# Patient Record
Sex: Female | Born: 1945
Health system: Southern US, Community
[De-identification: ages and names within clinical notes are randomized; demographics above are authoritative.]

## PROBLEM LIST (undated history)

## (undated) DIAGNOSIS — E782 Mixed hyperlipidemia: Secondary | ICD-10-CM

## (undated) DIAGNOSIS — G4710 Hypersomnia, unspecified: Secondary | ICD-10-CM

## (undated) DIAGNOSIS — H905 Unspecified sensorineural hearing loss: Secondary | ICD-10-CM

## (undated) DIAGNOSIS — F329 Major depressive disorder, single episode, unspecified: Secondary | ICD-10-CM

## (undated) DIAGNOSIS — E1165 Type 2 diabetes mellitus with hyperglycemia: Secondary | ICD-10-CM

## (undated) DIAGNOSIS — Z1211 Encounter for screening for malignant neoplasm of colon: Secondary | ICD-10-CM

## (undated) DIAGNOSIS — D563 Thalassemia minor: Secondary | ICD-10-CM

## (undated) DIAGNOSIS — E039 Hypothyroidism, unspecified: Secondary | ICD-10-CM

## (undated) DIAGNOSIS — F32A Depression, unspecified: Secondary | ICD-10-CM

## (undated) DIAGNOSIS — J189 Pneumonia, unspecified organism: Secondary | ICD-10-CM

## (undated) DIAGNOSIS — IMO0001 Reserved for inherently not codable concepts without codable children: Secondary | ICD-10-CM

## (undated) DIAGNOSIS — T783XXA Angioneurotic edema, initial encounter: Secondary | ICD-10-CM

## (undated) DIAGNOSIS — E559 Vitamin D deficiency, unspecified: Secondary | ICD-10-CM

## (undated) DIAGNOSIS — E875 Hyperkalemia: Secondary | ICD-10-CM

## (undated) DIAGNOSIS — M8580 Other specified disorders of bone density and structure, unspecified site: Secondary | ICD-10-CM

## (undated) DIAGNOSIS — Z23 Encounter for immunization: Secondary | ICD-10-CM

## (undated) DIAGNOSIS — I10 Essential (primary) hypertension: Secondary | ICD-10-CM

## (undated) HISTORY — PX: TONSILLECTOMY AND ADENOIDECTOMY: SHX28

## (undated) HISTORY — DX: Other specified disorders of bone density and structure, unspecified site: M85.80

## (undated) HISTORY — DX: Encounter for screening for malignant neoplasm of colon: Z12.11

## (undated) HISTORY — DX: Hypersomnia, unspecified: G47.10

## (undated) HISTORY — DX: Reserved for inherently not codable concepts without codable children: IMO0001

## (undated) HISTORY — DX: Hyperkalemia: E87.5

## (undated) HISTORY — DX: Major depressive disorder, single episode, unspecified: F32.9

## (undated) HISTORY — DX: Depression, unspecified: F32.A

## (undated) HISTORY — DX: Mixed hyperlipidemia: E78.2

## (undated) HISTORY — DX: Pneumonia, unspecified organism: J18.9

## (undated) HISTORY — DX: Hypothyroidism, unspecified: E03.9

## (undated) HISTORY — DX: Unspecified sensorineural hearing loss: H90.5

## (undated) HISTORY — DX: Angioneurotic edema, initial encounter: T78.3XXA

## (undated) HISTORY — DX: Thalassemia minor: D56.3

## (undated) HISTORY — DX: Encounter for immunization: Z23

## (undated) HISTORY — PX: BREAST BIOPSY: SHX20

## (undated) HISTORY — DX: Type 2 diabetes mellitus with hyperglycemia: E11.65

## (undated) HISTORY — DX: Essential (primary) hypertension: I10

## (undated) HISTORY — DX: Vitamin D deficiency, unspecified: E55.9

---

## 2006-03-28 HISTORY — PX: COLONOSCOPY: SHX174

## 2008-11-02 ENCOUNTER — Emergency Department (HOSPITAL_COMMUNITY): Admission: EM | Admit: 2008-11-02 | Discharge: 2008-11-02 | Payer: Self-pay | Admitting: Emergency Medicine

## 2008-11-11 DIAGNOSIS — I340 Nonrheumatic mitral (valve) insufficiency: Secondary | ICD-10-CM | POA: Insufficient documentation

## 2008-11-11 DIAGNOSIS — I079 Rheumatic tricuspid valve disease, unspecified: Secondary | ICD-10-CM

## 2009-04-13 ENCOUNTER — Encounter: Admission: RE | Admit: 2009-04-13 | Discharge: 2009-04-13 | Payer: Self-pay | Admitting: Family Medicine

## 2009-04-30 ENCOUNTER — Encounter: Admission: RE | Admit: 2009-04-30 | Discharge: 2009-04-30 | Payer: Medicare Other | Admitting: Family Medicine

## 2009-10-28 ENCOUNTER — Encounter: Admission: RE | Admit: 2009-10-28 | Discharge: 2009-10-28 | Payer: Self-pay | Admitting: Family Medicine

## 2010-02-25 DIAGNOSIS — J189 Pneumonia, unspecified organism: Secondary | ICD-10-CM

## 2010-02-25 HISTORY — DX: Pneumonia, unspecified organism: J18.9

## 2010-03-15 DIAGNOSIS — L659 Nonscarring hair loss, unspecified: Secondary | ICD-10-CM | POA: Insufficient documentation

## 2010-03-15 DIAGNOSIS — J3089 Other allergic rhinitis: Secondary | ICD-10-CM | POA: Insufficient documentation

## 2010-03-22 ENCOUNTER — Emergency Department (HOSPITAL_BASED_OUTPATIENT_CLINIC_OR_DEPARTMENT_OTHER)
Admission: EM | Admit: 2010-03-22 | Discharge: 2010-03-22 | Payer: Self-pay | Source: Home / Self Care | Admitting: Emergency Medicine

## 2010-04-17 ENCOUNTER — Other Ambulatory Visit: Payer: Self-pay | Admitting: Family Medicine

## 2010-04-17 DIAGNOSIS — N63 Unspecified lump in unspecified breast: Secondary | ICD-10-CM

## 2010-04-26 DIAGNOSIS — M8580 Other specified disorders of bone density and structure, unspecified site: Secondary | ICD-10-CM | POA: Insufficient documentation

## 2010-04-26 DIAGNOSIS — E785 Hyperlipidemia, unspecified: Secondary | ICD-10-CM | POA: Insufficient documentation

## 2010-05-04 ENCOUNTER — Ambulatory Visit
Admission: RE | Admit: 2010-05-04 | Discharge: 2010-05-04 | Disposition: A | Discharge status: Home / Self Care | Payer: Medicare Other | Source: Ambulatory Visit | Attending: Family Medicine | Admitting: Family Medicine

## 2010-05-04 DIAGNOSIS — N63 Unspecified lump in unspecified breast: Secondary | ICD-10-CM

## 2010-06-09 DIAGNOSIS — F339 Major depressive disorder, recurrent, unspecified: Secondary | ICD-10-CM | POA: Insufficient documentation

## 2010-08-04 DIAGNOSIS — E039 Hypothyroidism, unspecified: Secondary | ICD-10-CM | POA: Insufficient documentation

## 2010-08-04 DIAGNOSIS — I10 Essential (primary) hypertension: Secondary | ICD-10-CM | POA: Insufficient documentation

## 2011-04-06 ENCOUNTER — Other Ambulatory Visit: Payer: Self-pay | Admitting: Family Medicine

## 2011-04-06 DIAGNOSIS — Z1231 Encounter for screening mammogram for malignant neoplasm of breast: Secondary | ICD-10-CM

## 2011-04-29 ENCOUNTER — Other Ambulatory Visit: Payer: Self-pay | Admitting: Family Medicine

## 2011-04-29 DIAGNOSIS — E559 Vitamin D deficiency, unspecified: Secondary | ICD-10-CM | POA: Diagnosis not present

## 2011-04-29 DIAGNOSIS — M949 Disorder of cartilage, unspecified: Secondary | ICD-10-CM | POA: Diagnosis not present

## 2011-04-29 DIAGNOSIS — Z0000 Encounter for general adult medical examination without abnormal findings: Secondary | ICD-10-CM | POA: Diagnosis not present

## 2011-04-29 DIAGNOSIS — I059 Rheumatic mitral valve disease, unspecified: Secondary | ICD-10-CM | POA: Diagnosis not present

## 2011-04-29 DIAGNOSIS — E039 Hypothyroidism, unspecified: Secondary | ICD-10-CM | POA: Diagnosis not present

## 2011-04-29 DIAGNOSIS — Z780 Asymptomatic menopausal state: Secondary | ICD-10-CM

## 2011-04-29 DIAGNOSIS — M899 Disorder of bone, unspecified: Secondary | ICD-10-CM | POA: Diagnosis not present

## 2011-04-29 DIAGNOSIS — Z124 Encounter for screening for malignant neoplasm of cervix: Secondary | ICD-10-CM | POA: Diagnosis not present

## 2011-04-29 DIAGNOSIS — E785 Hyperlipidemia, unspecified: Secondary | ICD-10-CM | POA: Diagnosis not present

## 2011-04-29 DIAGNOSIS — F339 Major depressive disorder, recurrent, unspecified: Secondary | ICD-10-CM | POA: Diagnosis not present

## 2011-04-29 DIAGNOSIS — M8580 Other specified disorders of bone density and structure, unspecified site: Secondary | ICD-10-CM

## 2011-04-29 DIAGNOSIS — I10 Essential (primary) hypertension: Secondary | ICD-10-CM | POA: Diagnosis not present

## 2011-05-09 ENCOUNTER — Ambulatory Visit
Admission: RE | Admit: 2011-05-09 | Discharge: 2011-05-09 | Disposition: A | Discharge status: Home / Self Care | Payer: Medicare Other | Source: Ambulatory Visit | Attending: Family Medicine | Admitting: Family Medicine

## 2011-05-09 DIAGNOSIS — Z13820 Encounter for screening for osteoporosis: Secondary | ICD-10-CM | POA: Diagnosis not present

## 2011-05-09 DIAGNOSIS — Z780 Asymptomatic menopausal state: Secondary | ICD-10-CM | POA: Diagnosis not present

## 2011-05-09 DIAGNOSIS — Z1231 Encounter for screening mammogram for malignant neoplasm of breast: Secondary | ICD-10-CM | POA: Diagnosis not present

## 2011-05-09 DIAGNOSIS — M8580 Other specified disorders of bone density and structure, unspecified site: Secondary | ICD-10-CM

## 2011-05-30 DIAGNOSIS — I10 Essential (primary) hypertension: Secondary | ICD-10-CM | POA: Diagnosis not present

## 2011-05-30 DIAGNOSIS — M949 Disorder of cartilage, unspecified: Secondary | ICD-10-CM | POA: Diagnosis not present

## 2011-05-30 DIAGNOSIS — F339 Major depressive disorder, recurrent, unspecified: Secondary | ICD-10-CM | POA: Diagnosis not present

## 2011-05-30 DIAGNOSIS — E039 Hypothyroidism, unspecified: Secondary | ICD-10-CM | POA: Diagnosis not present

## 2011-05-30 DIAGNOSIS — M899 Disorder of bone, unspecified: Secondary | ICD-10-CM | POA: Diagnosis not present

## 2011-07-26 DIAGNOSIS — E039 Hypothyroidism, unspecified: Secondary | ICD-10-CM | POA: Diagnosis not present

## 2011-07-26 DIAGNOSIS — E559 Vitamin D deficiency, unspecified: Secondary | ICD-10-CM | POA: Diagnosis not present

## 2011-07-26 DIAGNOSIS — T148XXA Other injury of unspecified body region, initial encounter: Secondary | ICD-10-CM | POA: Diagnosis not present

## 2011-07-26 DIAGNOSIS — S30860A Insect bite (nonvenomous) of lower back and pelvis, initial encounter: Secondary | ICD-10-CM | POA: Diagnosis not present

## 2011-09-05 DIAGNOSIS — L272 Dermatitis due to ingested food: Secondary | ICD-10-CM | POA: Diagnosis not present

## 2012-01-16 DIAGNOSIS — E039 Hypothyroidism, unspecified: Secondary | ICD-10-CM | POA: Diagnosis not present

## 2012-01-16 DIAGNOSIS — J309 Allergic rhinitis, unspecified: Secondary | ICD-10-CM | POA: Diagnosis not present

## 2012-01-16 DIAGNOSIS — Z23 Encounter for immunization: Secondary | ICD-10-CM | POA: Diagnosis not present

## 2012-01-16 DIAGNOSIS — I10 Essential (primary) hypertension: Secondary | ICD-10-CM | POA: Diagnosis not present

## 2012-04-09 DIAGNOSIS — J209 Acute bronchitis, unspecified: Secondary | ICD-10-CM | POA: Diagnosis not present

## 2012-04-18 ENCOUNTER — Ambulatory Visit (INDEPENDENT_AMBULATORY_CARE_PROVIDER_SITE_OTHER): Payer: Medicare Other | Admitting: Family Medicine

## 2012-04-18 ENCOUNTER — Encounter: Payer: Self-pay | Admitting: Family Medicine

## 2012-04-18 VITALS — BP 144/86 | HR 68 | Temp 97.9°F | Ht 59.5 in | Wt 161.0 lb

## 2012-04-18 DIAGNOSIS — Z0000 Encounter for general adult medical examination without abnormal findings: Secondary | ICD-10-CM | POA: Diagnosis not present

## 2012-04-18 DIAGNOSIS — Z7189 Other specified counseling: Secondary | ICD-10-CM

## 2012-04-18 DIAGNOSIS — E039 Hypothyroidism, unspecified: Secondary | ICD-10-CM | POA: Diagnosis not present

## 2012-04-18 DIAGNOSIS — I10 Essential (primary) hypertension: Secondary | ICD-10-CM

## 2012-04-18 DIAGNOSIS — G4710 Hypersomnia, unspecified: Secondary | ICD-10-CM

## 2012-04-18 DIAGNOSIS — Z7689 Persons encountering health services in other specified circumstances: Secondary | ICD-10-CM

## 2012-04-18 NOTE — Progress Notes (Signed)
Office Note 04/22/2012  CC:  Chief Complaint  Patient presents with  . Establish Care    no problems    HPI:  Mackenzie Wood is a 67 y.o. White female who is here to establish care. Patient's most recent primary MD: Dr. Duanne Wood at [facility] in [place]. Old records were not reviewed prior to or during today's visit except a CMET and TSH pt brought dated 01/16/12.  Dose of synthroid was not changed 12/2011, although TSH was 0.085 (low).  CMET was normal. Pt says the TSH prior to the 12/2011 result was VERY low so her dose was decreased.  She admits that, if anything, her energy level is lower than what would be expected for having a recently oversuppressed TSH.  She says the only thing she has to mention today is a tendency to want to "sleep my life away".  Takes morning and afternoon nap.  Feels like she could nap more.  Has "always" been this way.  Says she gets a good night's sleep.  +Snoring, but husband has not mentioned apneic spells.   No sleeping in inappropriate situations or activities.  Denies depressed or sad mood.  Says she has always been this way.  ROS:  no tachycardia, no tremor, no hyperactivity.  No HA's, no GI complaints, no dizziness.  No urinary complaints.    Past Medical History  Diagnosis Date  . HTN (hypertension)   . Hypothyroidism   . Depression   . Congenital sensorineural hearing loss     deaf in right ear    Past Surgical History  Procedure Date  . Breast biopsy     Left breast biopsy x 2 (fibrocystic/benign)  . Tonsillectomy and adenoidectomy     as a child  . Colonoscopy 2008    Normal.  10 yr recall.    Family History  Problem Relation Age of Onset  . Hypertension Mother   . Hypertension Father     History   Social History  . Marital Status: Married    Spouse Name: N/A    Number of Children: N/A  . Years of Education: N/A   Occupational History  . Not on file.   Social History Main Topics  . Smoking status:  Never Smoker   . Smokeless tobacco: Never Used  . Alcohol Use: No  . Drug Use: No  . Sexually Active: Not on file   Other Topics Concern  . Not on file   Social History Narrative   Married, 2 daughters, 3 grandchildren.Retired Radiation protection practitioner.  Relocated from [place] around 2010.No T/A/Ds.  Exercise: gardens, lives active life, but no formal exercise except walking 3 times per week.  Diet: regular    Outpatient Encounter Prescriptions as of 04/18/2012  Medication Sig Dispense Refill  . Calcium Carbonate-Vitamin D (CALCIUM 600+D) 600-400 MG-UNIT per tablet Take 1 tablet by mouth 2 (two) times daily.      . citalopram (CELEXA) 10 MG tablet Take 1 tablet by mouth Daily.      Marland Kitchen levothyroxine (SYNTHROID, LEVOTHROID) 125 MCG tablet Take 1 tablet by mouth Daily.      Marland Kitchen losartan (COZAAR) 100 MG tablet Take 1 tablet by mouth Daily.      . montelukast (SINGULAIR) 10 MG tablet Take 1 tablet by mouth At bedtime.      . Omega-3 Fatty Acids (FISH OIL) 1000 MG CAPS Take 1 capsule by mouth 2 (two) times daily.        No Known Allergies  ROS See HPI PE; Blood pressure 144/86, pulse 68, temperature 97.9 F (36.6 C), temperature source Temporal, height 4' 11.5" (1.511 m), weight 161 lb (73.029 kg), SpO2 98.00%. Gen: Alert, well appearing.  Patient is oriented to person, place, time, and situation. ENT:  Eyes: no injection, icteris, swelling, or exudate.  EOMI, PERRLA. Nose: no drainage or turbinate edema/swelling.  No injection or focal lesion.  Mouth: lips without lesion/swelling.  Oral mucosa pink and moist.  Dentition intact and without obvious caries or gingival swelling.  Oropharynx without erythema, exudate, or swelling.  CV: RRR, no m/r/g.   LUNGS: CTA bilat, nonlabored resps, good aeration in all lung fields. EXT: no clubbing, cyanosis, or edema.   Pertinent labs:  None today  ASSESSMENT AND PLAN:   New pt: obtain old records.  Somnolence, daytime, controlled I think this is normal  for her, not pathologic. She has no excessive tendency to fall asleep inappropriately (driving, working, doing hobbies, etc).  She simply enjoys resting and sleeping. Quality of sleep Questionnaire score was 2 (low risk of OSA).  Encounter to establish care with new doctor Reviewed history, med list, will get old records.  No needs today. Will have her return in 1-2 mo for CPE, TSH check and other HP labs at that time as well.   Of note, in her med list she has amoxicillin "before dental procedures".  When asked why she says that a former PMD who, incidentally, was a cardiologist "picked something up" when auscultating her heart, and apparently told her from this that she needed to do prophylactic antibiotics for dental procedures.  She says no echocardiogram or other cardiac testing was done.  It is not clear how long ago all of this was.  Will have to see what old records show to try to verify pt's report.  She definitely denies any hx of a prosthetic valve or other cardiac surgery.  Return for 1-2 mo for CPE with fasting labs the week prior.

## 2012-04-20 ENCOUNTER — Other Ambulatory Visit: Payer: Self-pay | Admitting: Family Medicine

## 2012-04-20 DIAGNOSIS — Z1231 Encounter for screening mammogram for malignant neoplasm of breast: Secondary | ICD-10-CM

## 2012-04-22 ENCOUNTER — Encounter: Payer: Self-pay | Admitting: Family Medicine

## 2012-04-22 DIAGNOSIS — G4710 Hypersomnia, unspecified: Secondary | ICD-10-CM

## 2012-04-22 HISTORY — DX: Hypersomnia, unspecified: G47.10

## 2012-04-22 NOTE — Assessment & Plan Note (Signed)
Reviewed history, med list, will get old records.  No needs today. Will have her return in 1-2 mo for CPE, TSH check and other HP labs at that time as well.

## 2012-04-22 NOTE — Assessment & Plan Note (Signed)
I think this is normal for her, not pathologic. She has no excessive tendency to fall asleep inappropriately (driving, working, doing hobbies, etc).  She simply enjoys resting and sleeping. Quality of sleep Questionnaire score was 2 (low risk of OSA).

## 2012-05-21 ENCOUNTER — Ambulatory Visit
Admission: RE | Admit: 2012-05-21 | Discharge: 2012-05-21 | Disposition: A | Discharge status: Home / Self Care | Payer: Medicare Other | Source: Ambulatory Visit | Attending: Family Medicine | Admitting: Family Medicine

## 2012-05-21 DIAGNOSIS — Z1231 Encounter for screening mammogram for malignant neoplasm of breast: Secondary | ICD-10-CM | POA: Diagnosis not present

## 2012-05-24 ENCOUNTER — Other Ambulatory Visit (INDEPENDENT_AMBULATORY_CARE_PROVIDER_SITE_OTHER): Payer: Medicare Other

## 2012-05-24 DIAGNOSIS — I10 Essential (primary) hypertension: Secondary | ICD-10-CM | POA: Diagnosis not present

## 2012-05-24 DIAGNOSIS — E039 Hypothyroidism, unspecified: Secondary | ICD-10-CM | POA: Diagnosis not present

## 2012-05-24 LAB — LIPID PANEL
Cholesterol: 201 mg/dL — ABNORMAL HIGH (ref 0–200)
Triglycerides: 225 mg/dL — ABNORMAL HIGH (ref 0.0–149.0)

## 2012-05-24 LAB — CBC WITH DIFFERENTIAL/PLATELET
Eosinophils Relative: 1.8 % (ref 0.0–5.0)
HCT: 40.1 % (ref 36.0–46.0)
Lymphs Abs: 1.8 10*3/uL (ref 0.7–4.0)
MCV: 66.1 fl — ABNORMAL LOW (ref 78.0–100.0)
Monocytes Absolute: 0.5 10*3/uL (ref 0.1–1.0)
Monocytes Relative: 7.7 % (ref 3.0–12.0)
RDW: 15.6 % — ABNORMAL HIGH (ref 11.5–14.6)

## 2012-05-24 LAB — COMPREHENSIVE METABOLIC PANEL
Alkaline Phosphatase: 76 U/L (ref 39–117)
CO2: 27 mEq/L (ref 19–32)
GFR: 96.09 mL/min (ref >60.00)
Glucose, Bld: 119 mg/dL — ABNORMAL HIGH (ref 70–99)

## 2012-05-24 NOTE — Progress Notes (Signed)
Labs only

## 2012-05-25 ENCOUNTER — Encounter: Payer: Self-pay | Admitting: Family Medicine

## 2012-05-25 LAB — LDL CHOLESTEROL, DIRECT: Direct LDL: 137.6 mg/dL

## 2012-05-28 ENCOUNTER — Ambulatory Visit: Payer: Medicare Other

## 2012-05-28 LAB — IBC PANEL
TIBC: 375 ug/dL (ref 250–470)
UIBC: 238 ug/dL (ref 125–400)

## 2012-05-28 LAB — RETICULOCYTES: Retic Ct Pct: 1.3 % (ref 0.4–2.3)

## 2012-05-28 NOTE — Addendum Note (Signed)
Addended by: Baldemar Lenis R on: 05/28/2012 08:40 AM   Modules accepted: Orders

## 2012-05-29 ENCOUNTER — Other Ambulatory Visit: Payer: Self-pay | Admitting: Family Medicine

## 2012-05-29 ENCOUNTER — Encounter: Payer: Self-pay | Admitting: Family Medicine

## 2012-05-31 ENCOUNTER — Ambulatory Visit (INDEPENDENT_AMBULATORY_CARE_PROVIDER_SITE_OTHER): Payer: Medicare Other | Admitting: Family Medicine

## 2012-05-31 ENCOUNTER — Encounter: Payer: Self-pay | Admitting: Family Medicine

## 2012-05-31 VITALS — BP 136/82 | HR 65 | Temp 98.5°F | Ht 59.5 in | Wt 161.0 lb

## 2012-05-31 DIAGNOSIS — Z0000 Encounter for general adult medical examination without abnormal findings: Secondary | ICD-10-CM | POA: Insufficient documentation

## 2012-05-31 DIAGNOSIS — H9193 Unspecified hearing loss, bilateral: Secondary | ICD-10-CM

## 2012-05-31 NOTE — Progress Notes (Signed)
Office Note 06/01/2012  CC:  Chief Complaint  Patient presents with  . Annual Exam    no problems    HPI:  Mackenzie Wood is a 67 y.o. White female who is here for CPE. We reviewed her recent labs in detail: new dx DM 2 (A1c 7.2%).  Trigs and LDL high. RBC microcytosis c/w dx of Beta thall minor.  Iron studies normal. She feels well.  She does no exercise, lives a sedentary lifestyle.  She says she eats a prudent diet.  She reports right ear deafness from childhood, unknown etiology but she says "it is the nerve kind".  The last 9 mo or so she has noticed diminised left ear hearing and a feeling of stuffed up or "smothered" sensation in left ear, almost like there is something in it.    Past Medical History  Diagnosis Date  . HTN (hypertension)   . Hypothyroidism   . Depression   . Congenital sensorineural hearing loss     deaf in right ear  . CAP (community acquired pneumonia) 02/2010  . Beta thalassemia, heterozygous   . Deafness in right ear     Patient says she was dx'd with sensorineural hearing loss/deafness around age 4  . Somnolence, daytime, controlled 04/22/2012    Past Surgical History  Procedure Laterality Date  . Breast biopsy      Left breast biopsy x 2 (fibrocystic/benign)  . Tonsillectomy and adenoidectomy      as a child  . Colonoscopy  2008    Normal.  10 yr recall.    Family History  Problem Relation Age of Onset  . Hypertension Mother   . Hypertension Father     History   Social History  . Marital Status: Married    Spouse Name: N/A    Number of Children: N/A  . Years of Education: N/A   Occupational History  . Not on file.   Social History Main Topics  . Smoking status: Never Smoker   . Smokeless tobacco: Never Used  . Alcohol Use: No  . Drug Use: No  . Sexually Active: Not on file   Other Topics Concern  . Not on file   Social History Narrative   Married, 2 daughters, 3 grandchildren.   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   No T/A/Ds.  Exercise: sedentary life + no formal exercise at all.   Diet: regular                Outpatient Prescriptions Prior to Visit  Medication Sig Dispense Refill  . Calcium Carbonate-Vitamin D (CALCIUM 600+D) 600-400 MG-UNIT per tablet Take 1 tablet by mouth 2 (two) times daily.      . citalopram (CELEXA) 10 MG tablet Take 1 tablet by mouth Daily.      Marland Kitchen levothyroxine (SYNTHROID, LEVOTHROID) 125 MCG tablet Take 1 tablet by mouth Daily.      Marland Kitchen losartan (COZAAR) 100 MG tablet Take 1 tablet by mouth Daily.      . montelukast (SINGULAIR) 10 MG tablet Take 1 tablet by mouth At bedtime.      . Omega-3 Fatty Acids (FISH OIL) 1000 MG CAPS Take 1 capsule by mouth 2 (two) times daily.       No facility-administered medications prior to visit.    No Known Allergies  ROS Review of Systems  Constitutional: Negative for fever, chills, appetite change and fatigue.  HENT: Positive for hearing loss ("smothered" sensation, waxes and wanes,  in left ear x 9 mo or so). Negative for ear pain, congestion, sore throat, neck stiffness and dental problem.   Eyes: Negative for discharge, redness and visual disturbance.  Respiratory: Negative for cough, chest tightness, shortness of breath and wheezing.   Cardiovascular: Negative for chest pain, palpitations and leg swelling.  Gastrointestinal: Negative for nausea, vomiting, abdominal pain, diarrhea and blood in stool.  Endocrine: Negative for cold intolerance, heat intolerance, polydipsia, polyphagia and polyuria.  Genitourinary: Negative for dysuria, urgency, frequency, hematuria, flank pain and difficulty urinating.  Musculoskeletal: Negative for myalgias, back pain, joint swelling and arthralgias.  Skin: Negative for pallor and rash.  Neurological: Negative for dizziness, speech difficulty, weakness and headaches.  Hematological: Negative for adenopathy. Does not bruise/bleed easily.  Psychiatric/Behavioral:  Negative for confusion and sleep disturbance. The patient is not nervous/anxious.     PE; Blood pressure 136/82, pulse 65, temperature 98.5 F (36.9 C), temperature source Temporal, height 4' 11.5" (1.511 m), weight 161 lb (73.029 kg). Gen: Alert, well appearing.  Patient is oriented to person, place, time, and situation. AFFECT: pleasant, lucid thought and speech. ENT: Ears: EACs clear, normal epithelium.  TMs with good light reflex and landmarks bilaterally.   Weber: did not lateralize to either side but she said she could hear it. Rinne on left side showed A>B conduction.  Rinne on right side showed B>A. Eyes: no injection, icteris, swelling, or exudate.  EOMI, PERRLA. Nose: no drainage or turbinate edema/swelling.  No injection or focal lesion.  Mouth: lips without lesion/swelling.  Oral mucosa pink and moist.  Dentition intact and without obvious caries or gingival swelling.  Oropharynx without erythema, exudate, or swelling.  Neck: supple/nontender.  No LAD, mass, or TM.  Carotid pulses 2+ bilaterally, without bruits. CV: RRR, no m/r/g.   LUNGS: CTA bilat, nonlabored resps, good aeration in all lung fields. ABD: soft, NT, ND, BS normal.  No hepatospenomegaly or mass.  No bruits. EXT: no clubbing, cyanosis, or edema.  Musculoskeletal: no joint swelling, erythema, warmth, or tenderness.  ROM of all joints intact. Skin - no sores or suspicious lesions or rashes or color changes   Pertinent labs:  Lab Results  Component Value Date   HGBA1C 7.2* 05/28/2012   Lab Results  Component Value Date   TSH 1.71 05/24/2012   Lab Results  Component Value Date   WBC 6.2 05/24/2012   HGB 12.5 05/24/2012   HCT 40.1 05/24/2012   MCV 66.1* 05/24/2012   PLT 244.0 05/24/2012   Lab Results  Component Value Date   CREATININE 0.7 05/24/2012   BUN 20 05/24/2012   NA 138 05/24/2012   K 4.7 05/24/2012   CL 104 05/24/2012   CO2 27 05/24/2012   Lab Results  Component Value Date   ALT 28 05/24/2012   AST  23 05/24/2012   ALKPHOS 76 05/24/2012   BILITOT 0.6 05/24/2012   Lab Results  Component Value Date   CHOL 201* 05/24/2012   Lab Results  Component Value Date   HDL 41.60 05/24/2012   No results found for this basename: LDLCALC   Lab Results  Component Value Date   TRIG 225.0* 05/24/2012   Lab Results  Component Value Date   CHOLHDL 5 05/24/2012   No results found for this basename: PSA    ASSESSMENT AND PLAN:   Type II or unspecified type diabetes mellitus without mention of complication, uncontrolled Reviewed A1c and what this number means, how it correlates to risk of microvascular complications of  DM 2. We still agree that med not indicated at this time but that is ONLY if she really adjusts diet (nutrition consult pending) and activity level consistently.  We did not have a glucometer to dispense to her today but CMA Francee Piccolo did teaching on how to check glucose and I asked pt to make a nurse visit at her earliest convenience to teach her how to use the glucometer she picks up with the rx I gave her today. I asked her to check her glucose bid (1 fasting and 1 two hour PP) for the next month and bring these in for review.  Health maintenance examination Reviewed age and gender appropriate health maintenance issues (prudent diet, regular exercise, health risks of tobacco and excessive alcohol, use of seatbelts, fire alarms in home, use of sunscreen).  Also reviewed age and gender appropriate health screening as well as vaccine recommendations.  She will check her records about pneumovax: she seems sure she had it a couple of years ago. Last pap was last year and normal per her report.  Last mammo this year: normal.  Bone density testing 04/2011 showed mild osteopenia (T score -1.6).  She'll continue 1500mg  calcium daily and 800 IU of vitD daily.     Hearing loss of both ears Right side as a child. Left side only recently an issue. Will have her return for nurse visit (when  she gets her glucometer teaching) to obtain audiogram. Will then make referral to audiology for further e/m.    FOLLOW UP:  Return for office visit 1 mo for f/u DM 2.  Lab visit at your earliest convenience for audiogram and gluc teach.

## 2012-05-31 NOTE — Patient Instructions (Signed)
Check glucose twice daily: once in morning before eating or drinking anything, then again 2 hours after any meal. Write these down and bring to f/u appt in 1 month.

## 2012-06-01 ENCOUNTER — Encounter: Payer: Self-pay | Admitting: Family Medicine

## 2012-06-01 NOTE — Assessment & Plan Note (Signed)
Reviewed age and gender appropriate health maintenance issues (prudent diet, regular exercise, health risks of tobacco and excessive alcohol, use of seatbelts, fire alarms in home, use of sunscreen).  Also reviewed age and gender appropriate health screening as well as vaccine recommendations.  She will check her records about pneumovax: she seems sure she had it a couple of years ago. Last pap was last year and normal per her report.  Last mammo this year: normal.  Bone density testing 04/2011 showed mild osteopenia (T score -1.6).  She'll continue 1500mg  calcium daily and 800 IU of vitD daily.

## 2012-06-01 NOTE — Assessment & Plan Note (Signed)
Right side as a child. Left side only recently an issue. Will have her return for nurse visit (when she gets her glucometer teaching) to obtain audiogram. Will then make referral to audiology for further e/m.

## 2012-06-01 NOTE — Assessment & Plan Note (Signed)
Reviewed A1c and what this number means, how it correlates to risk of microvascular complications of DM 2. We still agree that med not indicated at this time but that is ONLY if she really adjusts diet (nutrition consult pending) and activity level consistently.  We did not have a glucometer to dispense to her today but CMA Francee Piccolo did teaching on how to check glucose and I asked pt to make a nurse visit at her earliest convenience to teach her how to use the glucometer she picks up with the rx I gave her today. I asked her to check her glucose bid (1 fasting and 1 two hour PP) for the next month and bring these in for review.

## 2012-06-06 ENCOUNTER — Ambulatory Visit: Payer: Medicare Other | Admitting: Family Medicine

## 2012-06-07 NOTE — Progress Notes (Signed)
Pt presented for nurse visit for audiometry and glucometer teaching.  Detailed instructions given to pt and meter set up.  Pt has [employer] meter.  Glucose today was 84 on pt meter.  Level taken approx 4 hours after last eating. Audiometry performed on left ear only and results documened.

## 2012-06-07 NOTE — Progress Notes (Signed)
Audiogram reviewed.  C/w presbycusis left ear. Will refer to audiologist.

## 2012-06-13 ENCOUNTER — Other Ambulatory Visit: Payer: Self-pay | Admitting: *Deleted

## 2012-06-13 MED ORDER — LEVOTHYROXINE SODIUM 125 MCG PO TABS: 125.0000 ug | ORAL_TABLET | Freq: Every day | ORAL | Status: DC

## 2012-06-13 MED ORDER — CITALOPRAM HYDROBROMIDE 10 MG PO TABS: 10.0000 mg | ORAL_TABLET | Freq: Every day | ORAL | Status: DC

## 2012-06-13 NOTE — Telephone Encounter (Signed)
Faxed refill request received from pharmacy for 90 day CITALOPRAM, LEVOTHYROXINE  Last filled by MD on---first fill by our office Last seen on 05/31/12 Follow up 4/47/14 Refill sent per [facility] refill protocol.

## 2012-06-19 ENCOUNTER — Encounter: Payer: Self-pay | Admitting: Family Medicine

## 2012-06-21 ENCOUNTER — Encounter: Payer: Self-pay | Admitting: Family Medicine

## 2012-06-21 ENCOUNTER — Ambulatory Visit (INDEPENDENT_AMBULATORY_CARE_PROVIDER_SITE_OTHER): Payer: Medicare Other | Admitting: Family Medicine

## 2012-06-21 VITALS — Ht 60.0 in | Wt 159.3 lb

## 2012-06-21 DIAGNOSIS — IMO0001 Reserved for inherently not codable concepts without codable children: Secondary | ICD-10-CM | POA: Diagnosis not present

## 2012-06-21 NOTE — Patient Instructions (Addendum)
-   Start supplementing vitamin D at least 1000 IU per day.    Diet Recommendations for Diabetes   Starchy (carb) foods include: Bread, rice, pasta, potatoes, corn, crackers, bagels, muffins, all baked goods.   Protein foods include: Meat, fish, poultry, eggs, dairy foods, and beans such as pinto and kidney beans (beans also provide carbohydrate).   1. Eat at least 3 meals and 1-2 snacks per day. Never go more than 4-5 hours while awake without eating.  2. Limit starchy foods to TWO per meal and ONE per snack. ONE portion of a starchy  food is equal to the following:   - ONE slice of bread (or its equivalent, such as half of a hamburger bun).   - 1/2 cup of a "scoopable" starchy food such as potatoes or rice.   - 1 OUNCE (28 grams) of starchy snack foods such as crackers or pretzels (look on label).   - 15 grams of carbohydrate as shown on food label.  3. Both lunch and dinner should include a protein food, a carb food, and vegetables.   - Obtain twice as many veg's as protein or carbohydrate foods for both lunch and dinner.   - Try to keep frozen veg's on hand for a quick vegetable serving.     - Fresh or frozen veg's are best.  4. Breakfast should always include protein.    - TASTE PREFERENCES ARE LEARNED.   - NEXT VISIT WE WILL TALK ABOUT PHYSICAL ACTIVITY.

## 2012-06-21 NOTE — Progress Notes (Signed)
Medical Nutrition Therapy:  Appt start time: 1330 end time:  1430.  Assessment:  Primary concerns today: Weight management and Blood sugar control.  Ms. Lugar was recently diagnosed w/ DM.  She is determined to avoid going on medications for DM.  She has been monitoring her BG, fasting and 2-hr post-prandial.  FBG has been running 113-130.  Post-prandial has been 100-240.   Usual eating pattern includes 3 meals and 2-3 snacks per day. Usual physical activity includes housecleaning, gardening, and walking 30-45 min 2-3 X wk.  Frequent foods include coffee w/ ~2 tsp sugar, fruit.  Avoided foods include highly processed foods, and limiting starchy foods recently.    24-hr recall: (Up at 7 AM)  B (7:30 AM)-   Coffee w/ ~2 tsp sugar, 2 slc toast w/ <1 tsp butter; back to bed ~8-10 Snk (10 AM)-   2 marshmallos, 3/4 c honey nut cheerios, 2 tsp almonds, 1/2 c 1% milk L (1:30 PM)-  1 c beef stew, 1 slc bread, grapes, 1 tbsp choc chips Snk ( PM)-   D (7 PM)-  1 c tomato soup, 6-7 Saltines, water Snk ( PM)-   Yesterday was atypical.  A more normal day includes a slice of toast w/ a tbsp natural peanut butter.  She does usually go back to bed after taking her thyroid med.  Lunch is sometimes a dessert such as cheesecake.  Dinner is usually a starch, a protein, and veg's/fruit.    Progress Towards Goal(s):  In progress.   Nutritional Diagnosis:  NI-5.8.3 Inappropriate intake of types of carbohydrates (specify):   As related to fruits and veg's.  As evidenced by usual intake of vegetables only once a day.    Intervention:  Nutrition education.  Monitoring/Evaluation:  Dietary intake, exercise, BG, and body weight in 4 week(s).

## 2012-07-02 ENCOUNTER — Ambulatory Visit (INDEPENDENT_AMBULATORY_CARE_PROVIDER_SITE_OTHER): Payer: Medicare Other | Admitting: Family Medicine

## 2012-07-02 ENCOUNTER — Encounter: Payer: Self-pay | Admitting: Family Medicine

## 2012-07-02 VITALS — BP 130/84 | HR 68 | Ht 59.5 in | Wt 161.0 lb

## 2012-07-02 DIAGNOSIS — IMO0001 Reserved for inherently not codable concepts without codable children: Secondary | ICD-10-CM

## 2012-07-02 DIAGNOSIS — I10 Essential (primary) hypertension: Secondary | ICD-10-CM

## 2012-07-02 LAB — MICROALBUMIN / CREATININE URINE RATIO
Microalb Creat Ratio: 1.2 mg/g (ref 0.0–30.0)
Microalb, Ur: 1.3 mg/dL (ref 0.0–1.9)

## 2012-07-02 NOTE — Progress Notes (Signed)
OFFICE NOTE  07/02/2012  CC:  Chief Complaint  Patient presents with  . Follow-up    DM     HPI: Patient is a 67 y.o. [name] who is here for 1 mo f/u DM 2--diet only. Saw nutritionist and says she learned a lot and it was worthwhile, has f/u appt set. Fasting gluc avg around 105, 2H PP 140 or less.   BP monitoring at home is infrequent: she reports two systolics in stage 1 HTN range, one bp normal.  Discussed DM some more today, education about monitoring for end organ damage, importance of good glucose control, monitoring etc.  Denies any vision complaints, denies any burning/tingling/numbness of feet.  Pertinent PMH:  Past Medical History  Diagnosis Date  . HTN (hypertension)   . Hypothyroidism   . Depression   . Congenital sensorineural hearing loss     deaf in right ear; mild presbycusis in left ear  . CAP (community acquired pneumonia) 02/2010  . Beta thalassemia, heterozygous   . Somnolence, daytime, controlled 04/22/2012    MEDS:  Outpatient Prescriptions Prior to Visit  Medication Sig Dispense Refill  . Calcium Carbonate-Vitamin D (CALCIUM 600+D) 600-400 MG-UNIT per tablet Take 1 tablet by mouth 2 (two) times daily.      . citalopram (CELEXA) 10 MG tablet Take 1 tablet (10 mg total) by mouth daily.  90 tablet  1  . levothyroxine (SYNTHROID, LEVOTHROID) 125 MCG tablet Take 1 tablet (125 mcg total) by mouth daily.  90 tablet  1  . losartan (COZAAR) 100 MG tablet Take 1 tablet by mouth Daily.      . Omega-3 Fatty Acids (FISH OIL) 1000 MG CAPS Take 1 capsule by mouth 2 (two) times daily.      . montelukast (SINGULAIR) 10 MG tablet Take 1 tablet by mouth At bedtime.       No facility-administered medications prior to visit.    PE: Blood pressure 130/84, pulse 68, height 4' 11.5" (1.511 m), weight 161 lb (73.029 kg). Gen: Alert, well appearing.  Patient is oriented to person, place, time, and situation. No further exam today.  IMPRESSION AND PLAN:  1) DM  2, recent dx. Has seen nutritionist, working on dietary mgmt only at this time--no diabetic med. Check urine microalbumin/cr today, needs eye exam soon and I reminded her of the importance of this today. Feet are asymptomatic.  Will do diabetic foot exam at next f/u in office in 35mo, will also repeat HbA1c and cholesterol at that time.  2) HTN, good control.  An After Visit Summary was printed and given to the patient.  Spent 30 min with pt today, with >50% of this time spent in counseling and care coordination regarding the above problems.   FOLLOW UP:  35mo, fasting.

## 2012-07-07 ENCOUNTER — Encounter: Payer: Self-pay | Admitting: Family Medicine

## 2012-07-07 DIAGNOSIS — I10 Essential (primary) hypertension: Secondary | ICD-10-CM | POA: Insufficient documentation

## 2012-07-23 ENCOUNTER — Encounter: Payer: Self-pay | Admitting: Family Medicine

## 2012-07-23 ENCOUNTER — Ambulatory Visit (INDEPENDENT_AMBULATORY_CARE_PROVIDER_SITE_OTHER): Payer: Medicare Other | Admitting: Family Medicine

## 2012-07-23 VITALS — Ht 60.0 in | Wt 161.2 lb

## 2012-07-23 DIAGNOSIS — IMO0001 Reserved for inherently not codable concepts without codable children: Secondary | ICD-10-CM | POA: Diagnosis not present

## 2012-07-23 NOTE — Progress Notes (Signed)
Medical Nutrition Therapy:  Appt start time: 0900 end time:  1000.  Assessment:  Primary concerns today: Weight management and Blood sugar control.  Mackenzie Wood has been trying to get vegetables at lunch time as well, although does not always do so.  She has been doing some gardening, and she has been making an effort to move more throughout the day, but has not yet started an actual exercise program.  FBG has been 102-124, with most ~110-112, which is down ~10 points over usual FBG before her first nutrition appt.    24-hr recall:  (UP at 7 AM) B (8 AM)-  1 slice wholewheat bread, <1 tbsp peanut butter, 1 c coffee w/ 1 1/2 tsp sugar, 1 tbsp cream Snk (10:30)-  1 egg, 2 slc toast w/ 1 tsp butter L (2:30 PM)-  Iced tea, 2 oz cheese, 4 Triscuits, 4 Premium crackers Snk ( PM)-  none D (6:30 PM)-  1 svng eggplant parmagian, 3/4 c spaghetti, 1 slice garlic bread Snk (8:30)-  1 1/2 c Cheerios  Yesterday was atypical in that Mackenzie Wood had no fruit, and she usually has yogurt or cottage cheese.  We reviewed Mackenzie Wood's food intake, and pointed out that dinner and bedtime snack were a bit high in CHO, and that veg's and more protein at lunch would be more nutritionally balanced.  Reiterated that protein tends to help stabilize BG, and that more veg's (as well as exercise) will help her BP as well as BG.    Progress Towards Goal(s):  In progress.   Nutritional Diagnosis:  Some progress noted on NI-5.8.3 Inappropriate intake of types of carbohydrates (specify):   As related to fruits and veg's.  As evidenced by usual intake of vegetables only once a day.    Intervention:  Nutrition education.  Monitoring/Evaluation:  Dietary intake, exercise, BG, and body weight in 4 week(s).

## 2012-07-23 NOTE — Patient Instructions (Addendum)
-   Add vegetables to lunch meals.  - Pay closer attention to your carb serving sizes, and remember to limit to two carb's per meal and one per snack.   - Continue to check fasting blood sugar on most days, with the occasional post-meal blood sugar.    - At the bottom of each page of your log book, average the fasting blood sugars, and record.   - Regular exercise (at least 5 days a week) will help both your blood pressure and blood sugar.    - GOAL:  At least 30 minutes 5 X wk.        Record your minutes you do exercise (or a check mark/star/happy face).        You may want to check out DVDs at [employer].    - Continuing to move more and to make good food choices will allow you to continue to feel good and to do the things you want to do.    - Call for appt if blood sugars, weight, and/or health practices do not go as planned:  [phone].  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  Call if interested.    - Congrat's for making some good changes already.  (Get moving!)

## 2012-08-31 ENCOUNTER — Ambulatory Visit (INDEPENDENT_AMBULATORY_CARE_PROVIDER_SITE_OTHER): Payer: Medicare Other | Admitting: Family Medicine

## 2012-08-31 ENCOUNTER — Encounter: Payer: Self-pay | Admitting: Family Medicine

## 2012-08-31 VITALS — BP 148/83 | HR 68 | Temp 97.9°F | Resp 14 | Wt 156.8 lb

## 2012-08-31 DIAGNOSIS — E039 Hypothyroidism, unspecified: Secondary | ICD-10-CM | POA: Diagnosis not present

## 2012-08-31 DIAGNOSIS — E119 Type 2 diabetes mellitus without complications: Secondary | ICD-10-CM | POA: Diagnosis not present

## 2012-08-31 DIAGNOSIS — I10 Essential (primary) hypertension: Secondary | ICD-10-CM

## 2012-08-31 DIAGNOSIS — IMO0001 Reserved for inherently not codable concepts without codable children: Secondary | ICD-10-CM

## 2012-08-31 NOTE — Progress Notes (Addendum)
OFFICE NOTE  08/31/2012  CC:  Chief Complaint  Patient presents with  . Follow-up    2-mth. [DM]; pt fasting for labs     HPI: Patient is a 67 y.o. [name] who is here for 2 mo f/u DM 2, diet controlled + HTN. Pt not exercising any still but she has been more active cleaning house, doing gardening, some walking in the park. Her diet is improved per her report.    Bp measurements at home have been normal. Fasting glucoses reviewed today: avg 110.  Occ random usually <140, rare reading >150s.  Energy level good, reports compliance with all meds.  Mood is good, sleep is fine, appetite fine. No palpitations, dizziness, chest pain, or SOB.   Pertinent PMH:  Past Medical History  Diagnosis Date  . HTN (hypertension)   . Hypothyroidism   . Depression   . Congenital sensorineural hearing loss     deaf in right ear; mild presbycusis in left ear (audiologist 2014)  . CAP (community acquired pneumonia) 02/2010  . Beta thalassemia, heterozygous   . Somnolence, daytime, controlled 04/22/2012   Past surgical, social, and family history reviewed and no changes noted since last office visit.  MEDS:  Outpatient Prescriptions Prior to Visit  Medication Sig Dispense Refill  . Calcium Carbonate-Vitamin D (CALCIUM 600+D) 600-400 MG-UNIT per tablet Take 1 tablet by mouth 2 (two) times daily.      . citalopram (CELEXA) 10 MG tablet Take 1 tablet (10 mg total) by mouth daily.  90 tablet  1  . levothyroxine (SYNTHROID, LEVOTHROID) 125 MCG tablet Take 1 tablet (125 mcg total) by mouth daily.  90 tablet  1  . losartan (COZAAR) 100 MG tablet Take 1 tablet by mouth Daily.      . montelukast (SINGULAIR) 10 MG tablet Take 1 tablet by mouth At bedtime.      . Omega-3 Fatty Acids (FISH OIL) 1000 MG CAPS Take 1 capsule by mouth 2 (two) times daily.       No facility-administered medications prior to visit.    PE: Blood pressure 148/83, pulse 68, temperature 97.9 F (36.6 C), temperature  source Oral, resp. rate 14, weight 156 lb 12 oz (71.101 kg), SpO2 94.00%. Gen: Alert, well appearing.  Patient is oriented to person, place, time, and situation. Feet: no skin breakdown or lesion.  No deformity.  Color pink and feet feel warm.   DP and PT pulses 2+ bilat.  Monofilament testing normal bilat.  IMPRESSION AND PLAN:  1) DM 2, diet controlled--doing well.  Continue diet, push exercise some more.  Encouraged pt again today to arrange her initial diab retpthy screening exam. Foot exam today normal.  Due for recheck HbA1c and FLP today.  2) Hyperlipidemia.  Problem stable.  Continue current medications and diet appropriate for this condition.  We have reviewed our general long term plan for this problem and also reviewed symptoms and signs that should prompt the patient to call or return to the office.  FLP today.  3) Hypothyroidism.  Problem stable.  Continue current medications and diet appropriate for this condition.  We have reviewed our general long term plan for this problem and also reviewed symptoms and signs that should prompt the patient to call or return to the office. TSH recheck at f/u in 6 mo. 4) HTN.  Stable.  Continue current mgmt. [employer] An After Visit Summary was printed and given to the patient.   FOLLOW UP: 80mo

## 2012-09-03 ENCOUNTER — Other Ambulatory Visit (INDEPENDENT_AMBULATORY_CARE_PROVIDER_SITE_OTHER): Payer: Medicare Other

## 2012-09-03 DIAGNOSIS — E119 Type 2 diabetes mellitus without complications: Secondary | ICD-10-CM

## 2012-09-03 NOTE — Addendum Note (Signed)
Addended by: Baldemar Lenis R on: 09/03/2012 09:31 AM   Modules accepted: Orders

## 2012-09-03 NOTE — Progress Notes (Signed)
Labs only

## 2012-09-04 LAB — LIPID PANEL
Cholesterol: 186 mg/dL (ref 0–200)
HDL: 39 mg/dL — ABNORMAL LOW (ref >39.00)
LDL Cholesterol: 107 mg/dL — ABNORMAL HIGH (ref 0–99)
Triglycerides: 200 mg/dL — ABNORMAL HIGH (ref 0.0–149.0)

## 2012-09-04 LAB — HEMOGLOBIN A1C: Hgb A1c MFr Bld: 7.3 % — ABNORMAL HIGH (ref 4.6–6.5)

## 2012-09-09 ENCOUNTER — Encounter: Payer: Self-pay | Admitting: Family Medicine

## 2012-09-10 ENCOUNTER — Telehealth: Payer: Self-pay | Admitting: *Deleted

## 2012-09-10 NOTE — Telephone Encounter (Signed)
Fine. Still keep f/u for 6 mo. -thx

## 2012-09-10 NOTE — Telephone Encounter (Signed)
Message copied by Regis Bill on Mon Sep 10, 2012 12:00 PM ------      Message from: Jeoffrey Massed      Created: Sun Sep 09, 2012  5:40 PM       Pls notify pt that her HbA1c was not improved compared to 3 mo ago: 7.3%.  Also, bad cholesterol too high, good cholesterol too low, triglycerides too high.      Needs to start metformin 500 mg twice a day; pls do eRx for metformin 500 mg tabs, 1 tab po bid, #60, RF x 2.        Have her also start generic lipitor for her cholesterol, but tell her to wait a week before starting it so she can make sure she is tolerating metformin first.      Pls do eRx for atorvastatin 20mg , 1 tab po qd, #30, RF x 2.      Have her change her next f/u to 29mo from now. ------

## 2012-09-10 NOTE — Telephone Encounter (Signed)
Patient states that "she is just not a medicine person, believes that when they get into your system that your liver gets compromised & that she is just Not going to take the medicines"; explained that provider's recommendations come from the combination of her cholesterol/triglyceride levels along with pt's average A1C. Patient still refuses medication recommendations; please advise on alternative options and if pt should keep f/u for 6-mths, if no medications taken/SLS

## 2012-09-12 NOTE — Telephone Encounter (Signed)
Patient informed, understood & agreed/SLS  

## 2012-10-04 ENCOUNTER — Other Ambulatory Visit: Payer: Self-pay

## 2012-10-15 ENCOUNTER — Encounter: Payer: Self-pay | Admitting: Family Medicine

## 2012-10-15 ENCOUNTER — Ambulatory Visit (INDEPENDENT_AMBULATORY_CARE_PROVIDER_SITE_OTHER): Payer: Medicare Other | Admitting: Family Medicine

## 2012-10-15 VITALS — BP 165/88 | HR 68 | Temp 97.9°F | Resp 16 | Ht 59.5 in | Wt 156.0 lb

## 2012-10-15 DIAGNOSIS — L259 Unspecified contact dermatitis, unspecified cause: Secondary | ICD-10-CM | POA: Diagnosis not present

## 2012-10-15 MED ORDER — MUPIROCIN 2 % EX OINT: TOPICAL_OINTMENT | Freq: Three times a day (TID) | CUTANEOUS | Status: DC

## 2012-10-15 MED ORDER — FLUTICASONE PROPIONATE 0.05 % EX CREA: TOPICAL_CREAM | Freq: Two times a day (BID) | CUTANEOUS | Status: DC

## 2012-10-15 NOTE — Progress Notes (Signed)
OFFICE NOTE  10/15/2012  CC:  Chief Complaint  Patient presents with  . Rash    since last week     HPI: Patient is a 67 y.o. [name] who is here for rash.  Onset about 1 wk ago, itchy rash on right wrist, then some spots have come up on left temple, some on right axillary region, right side of abdomen, and right preauricular region, one spont on right side of buttocks, one spot on bridge of nose. Calamine and benadryl taken, helped minimally b/c she is using very little.    Pertinent PMH:  Past Medical History  Diagnosis Date  . HTN (hypertension)   . Hypothyroidism   . Depression   . Congenital sensorineural hearing loss     deaf in right ear; mild presbycusis in left ear (audiologist 2014)  . CAP (community acquired pneumonia) 02/2010  . Beta thalassemia, heterozygous   . Somnolence, daytime, controlled 04/22/2012  . Hyperlipidemia, mixed    Past surgical, social, and family history reviewed and no changes noted since last office visit.  MEDS:  Outpatient Prescriptions Prior to Visit  Medication Sig Dispense Refill  . Calcium Carbonate-Vitamin D (CALCIUM 600+D) 600-400 MG-UNIT per tablet Take 1 tablet by mouth 2 (two) times daily.      . citalopram (CELEXA) 10 MG tablet Take 1 tablet (10 mg total) by mouth daily.  90 tablet  1  . levothyroxine (SYNTHROID, LEVOTHROID) 125 MCG tablet Take 1 tablet (125 mcg total) by mouth daily.  90 tablet  1  . losartan (COZAAR) 100 MG tablet Take 1 tablet by mouth Daily.      . Omega-3 Fatty Acids (FISH OIL) 1000 MG CAPS Take 1 capsule by mouth 2 (two) times daily.      . montelukast (SINGULAIR) 10 MG tablet Take 1 tablet by mouth At bedtime.       No facility-administered medications prior to visit.    PE: Blood pressure 165/88, pulse 68, temperature 97.9 F (36.6 C), temperature source Oral, resp. rate 16, height 4' 11.5" (1.511 m), weight 156 lb (70.761 kg), SpO2 96.00%. Gen: Alert, well appearing.  Patient is oriented to  person, place, time, and situation. Right wrist: scattered pinkish papules, two 3-43mm oval areas of weeping superficial ulceration noted.  No surrounding erythema, no streaking, no tenderness.   Right pre-auricular area with a few dried papules, left temple with a few linearly arranged pinkish papules, similar papular rash on right side of abdomen and bridge of nose.  IMPRESSION AND PLAN:  Contact dermatitis Apply cutivate 0.05% cream to affected areas. Apply bactroban ointment to right wrist---it is not infected at this time but has highest risk of superinfection of all her lesions due to weeping characteristics/broken skin barrier. Discussed use of nonsedating antihistamine prn. Avoidance of poison ivy/oak/sumac in future discussed. Signs/symptoms to call or return for were reviewed and pt expressed understanding.   An After Visit Summary was printed and given to the patient.  FOLLOW UP: prn

## 2012-10-15 NOTE — Assessment & Plan Note (Signed)
Apply cutivate 0.05% cream to affected areas. Apply bactroban ointment to right wrist---it is not infected at this time but has highest risk of superinfection of all her lesions due to weeping characteristics/broken skin barrier. Discussed use of nonsedating antihistamine prn. Avoidance of poison ivy/oak/sumac in future discussed. Signs/symptoms to call or return for were reviewed and pt expressed understanding.

## 2012-10-18 DIAGNOSIS — H40029 Open angle with borderline findings, high risk, unspecified eye: Secondary | ICD-10-CM | POA: Diagnosis not present

## 2012-10-23 ENCOUNTER — Ambulatory Visit (INDEPENDENT_AMBULATORY_CARE_PROVIDER_SITE_OTHER): Payer: Medicare Other | Admitting: Family Medicine

## 2012-10-23 ENCOUNTER — Encounter: Payer: Self-pay | Admitting: Family Medicine

## 2012-10-23 VITALS — BP 160/83 | HR 74 | Temp 98.4°F | Resp 18 | Ht 59.5 in | Wt 158.0 lb

## 2012-10-23 DIAGNOSIS — L259 Unspecified contact dermatitis, unspecified cause: Secondary | ICD-10-CM

## 2012-10-23 MED ORDER — PREDNISONE 20 MG PO TABS: ORAL_TABLET | ORAL | Status: DC

## 2012-10-23 NOTE — Progress Notes (Signed)
OFFICE NOTE  10/23/2012  CC:  Chief Complaint  Patient presents with  . Follow-up  . Rash     HPI: Patient is a 67 y.o. [name] who is here for 1 wk f/u rash.   Cutivate 0.05% cream rx'd last visit.  Interim hx: some more very itchy spots coming up on trunk and legs, both sides of body. Spot on right wrist is the most dried out and appears to be healing with scabs.  "Unbelievably itchy". Applying cutivate as instructed.  Sleeps with husband every night and he has no rash or itching. She is taking a claritin daily for itch.  Pertinent PMH:  Past Medical History  Diagnosis Date  . HTN (hypertension)   . Hypothyroidism   . Depression   . Congenital sensorineural hearing loss     deaf in right ear; mild presbycusis in left ear (audiologist 2014)  . CAP (community acquired pneumonia) 02/2010  . Beta thalassemia, heterozygous   . Somnolence, daytime, controlled 04/22/2012  . Hyperlipidemia, mixed    Past surgical, social, and family history reviewed and no changes noted since last office visit.  MEDS:  Outpatient Prescriptions Prior to Visit  Medication Sig Dispense Refill  . Calcium Carbonate-Vitamin D (CALCIUM 600+D) 600-400 MG-UNIT per tablet Take 1 tablet by mouth 2 (two) times daily.      . citalopram (CELEXA) 10 MG tablet Take 1 tablet (10 mg total) by mouth daily.  90 tablet  1  . fluticasone (CUTIVATE) 0.05 % cream Apply topically 2 (two) times daily. Apply bid to affected areas as needed for contact dermatitis rash  30 g  1  . levothyroxine (SYNTHROID, LEVOTHROID) 125 MCG tablet Take 1 tablet (125 mcg total) by mouth daily.  90 tablet  1  . losartan (COZAAR) 100 MG tablet Take 1 tablet by mouth Daily.      . mupirocin ointment (BACTROBAN) 2 % Apply topically 3 (three) times daily. Apply to affected area tid x 10d  15 g  1  . Omega-3 Fatty Acids (FISH OIL) 1000 MG CAPS Take 1 capsule by mouth 2 (two) times daily.      . montelukast (SINGULAIR) 10 MG tablet Take  1 tablet by mouth At bedtime.       No facility-administered medications prior to visit.    PE: Blood pressure 160/83, pulse 74, temperature 98.4 F (36.9 C), temperature source Temporal, resp. rate 18, height 4' 11.5" (1.511 m), weight 158 lb (71.668 kg), SpO2 96.00%. Gen: Alert, well appearing.  Patient is oriented to person, place, time, and situation. AFFECT: pleasant, lucid thought and speech. Eyes and mouth normal.  Minimal dried and hyperkeratotic skin in left upper forehead, otherwise face skin normal.  She has several patches of eryth papules, mostly very dry/hyerkeratotic appearing, a few areas have more of a fine scattered papular appearance (lower anterior trunk).  Upper buttocks with similar lesions.  No vesicles, no hives.  IMPRESSION AND PLAN:  Contact dermatitis: not improving much with topical steroid alone. D/C cutivate and start prednisone taper: 40mg  qd x 7d, then 20mg  qd x 7d, then 10mg  qd x 8d, then stop.  FOLLOW UP: prn

## 2012-12-21 ENCOUNTER — Other Ambulatory Visit: Payer: Self-pay | Admitting: Family Medicine

## 2012-12-21 MED ORDER — CITALOPRAM HYDROBROMIDE 10 MG PO TABS: 10.0000 mg | ORAL_TABLET | Freq: Every day | ORAL | Status: DC

## 2013-01-01 DIAGNOSIS — L819 Disorder of pigmentation, unspecified: Secondary | ICD-10-CM | POA: Diagnosis not present

## 2013-01-01 DIAGNOSIS — T148 Other injury of unspecified body region: Secondary | ICD-10-CM | POA: Diagnosis not present

## 2013-01-07 ENCOUNTER — Other Ambulatory Visit: Payer: Self-pay | Admitting: Family Medicine

## 2013-01-07 MED ORDER — LEVOTHYROXINE SODIUM 125 MCG PO TABS: 125.0000 ug | ORAL_TABLET | Freq: Every day | ORAL | Status: DC

## 2013-01-17 ENCOUNTER — Encounter: Payer: Self-pay | Admitting: Family Medicine

## 2013-02-07 ENCOUNTER — Ambulatory Visit (INDEPENDENT_AMBULATORY_CARE_PROVIDER_SITE_OTHER): Payer: Medicare Other | Admitting: Family Medicine

## 2013-02-07 ENCOUNTER — Encounter: Payer: Self-pay | Admitting: Family Medicine

## 2013-02-07 VITALS — BP 176/80 | HR 70 | Temp 98.8°F | Resp 18 | Ht 59.5 in | Wt 153.0 lb

## 2013-02-07 DIAGNOSIS — L259 Unspecified contact dermatitis, unspecified cause: Secondary | ICD-10-CM | POA: Diagnosis not present

## 2013-02-07 DIAGNOSIS — I10 Essential (primary) hypertension: Secondary | ICD-10-CM | POA: Diagnosis not present

## 2013-02-07 DIAGNOSIS — L309 Dermatitis, unspecified: Secondary | ICD-10-CM

## 2013-02-07 MED ORDER — PREDNISONE 20 MG PO TABS: ORAL_TABLET | ORAL | Status: DC

## 2013-02-07 NOTE — Addendum Note (Signed)
Addended by: Jeoffrey Massed on: 02/07/2013 10:49 AM   Modules accepted: Level of Service

## 2013-02-07 NOTE — Progress Notes (Addendum)
OFFICE NOTE  02/07/2013  CC:  Chief Complaint  Patient presents with  . Abrasion    xSunday     HPI: Patient is a 67 y.o. [name] who is here for rash. Onset about 4 d/a, itchy little bumps--some appear fluid filled, some are grouped together. Locations: fingers, flexor surfaces of forearms, upper chest, chin/lower cheeks, left temple/forehead, all very itchy.  She does garden and uses gloves but helped pull out evergreen bushes recently and thinks maybe this touched her a bit.  Otherwise she cannot think of anything that would have caused the rash.  She had a couple of days of mid abd pain and some loose stools a couple of days prior to onset of sx's.   She has taken benadryl a couple of days for this.  She is unsure if benadryl helped b/c it made her sleep so much.  She has applied [place] cream but no other topicals.  ROS: no oral lesions, no malaise, no f/c.  No polyuria or polydipsia.  No HA, dizziness, or chest pains.  Pertinent PMH:  Past Medical History  Diagnosis Date  . HTN (hypertension)   . Hypothyroidism   . Depression   . Congenital sensorineural hearing loss     deaf in right ear; mild presbycusis in left ear (audiologist 2014)  . CAP (community acquired pneumonia) 02/2010  . Beta thalassemia, heterozygous   . Somnolence, daytime, controlled 04/22/2012  . Hyperlipidemia, mixed   . Type II or unspecified type diabetes mellitus without mention of complication, uncontrolled     08/2012 pt refused to start meds despite rising HbA1c on diet only   Past surgical, social, and family history reviewed and no changes noted since last office visit.  MEDS:  Outpatient Prescriptions Prior to Visit  Medication Sig Dispense Refill  . Calcium Carbonate-Vitamin D (CALCIUM 600+D) 600-400 MG-UNIT per tablet Take 1 tablet by mouth 2 (two) times daily.      . citalopram (CELEXA) 10 MG tablet Take 1 tablet (10 mg total) by mouth daily.  90 tablet  1  . fluticasone  (CUTIVATE) 0.05 % cream Apply topically 2 (two) times daily. Apply bid to affected areas as needed for contact dermatitis rash  30 g  1  . levothyroxine (SYNTHROID, LEVOTHROID) 125 MCG tablet Take 1 tablet (125 mcg total) by mouth daily.  90 tablet  1  . loratadine (CLARITIN) 10 MG tablet Take 10 mg by mouth daily.      Marland Kitchen losartan (COZAAR) 100 MG tablet Take 1 tablet by mouth Daily.      . montelukast (SINGULAIR) 10 MG tablet Take 1 tablet by mouth At bedtime.      . Omega-3 Fatty Acids (FISH OIL) 1000 MG CAPS Take 1 capsule by mouth 2 (two) times daily.      . mupirocin ointment (BACTROBAN) 2 % Apply topically 3 (three) times daily. Apply to affected area tid x 10d  15 g  1  . predniSONE (DELTASONE) 20 MG tablet 2 tabs po qd x 7d, then 1 tab po qd x 7d, then 1/2 tab po qd x 8d, then stop  25 tablet  0   No facility-administered medications prior to visit.    PE: Blood pressure 176/80, pulse 70, temperature 98.8 F (37.1 C), temperature source Temporal, resp. rate 18, height 4' 11.5" (1.511 m), weight 153 lb (69.4 kg), SpO2 94.00%.  Repeat manual bp by me was 176/80 in right arm. Gen: Alert, well appearing.  Patient is  oriented to person, place, time, and situation. ENT:   EOMI, PERRLA. Nose: no drainage or turbinate edema/swelling.  No injection or focal lesion.  Mouth: lips without lesion/swelling.  Oral mucosa pink and moist.  Dentition intact and without obvious caries or gingival swelling.  Oropharynx without erythema, exudate, or swelling.  Neck - No masses or thyromegaly or limitation in range of motion CV: RRR, no m/r/g.   LUNGS: CTA bilat, nonlabored resps, good aeration in all lung fields. Scattered pinkish papules and vesicles, some groups of 4-6 and some groups of 20-30 together: areas involved are fingers, flexor surfaces of forearms, upper chest, chin/lower cheeks, and left side of forehead/temple area.  No tenderness or nodularity or streakiness.   IMPRESSION AND  PLAN:  Eczematous dermatitis In the past occurrences of this we tentatively blamed this on contact irritant/allergen but the connection was not crystal clear. It is even less clear with the current outbreak. Will treat with prednisone taper: 40 qd x 7d, 20qd x 7d, 10 qd x 8d, then stop.  She may apply the cutivate cream 0.05% she has from previous rx bid prn. Discussed possibly seeing her dermatologist about this problem (Dr. Ethelene Hal, office near [facility] in [place]), but decided it was ok to forego this at this time.   Of note, pt reports her home bps are normal and her glucose checks (about 4 X / week, usually fasting) are always 120 or less.  FOLLOW UP: prn

## 2013-02-07 NOTE — Assessment & Plan Note (Signed)
In the past occurrences of this we tentatively blamed this on contact irritant/allergen but the connection was not crystal clear. It is even less clear with the current outbreak. Will treat with prednisone taper: 40 qd x 7d, 20qd x 7d, 10 qd x 8d, then stop.  She may apply the cutivate cream 0.05% she has from previous rx bid prn. Discussed possibly seeing her dermatologist about this problem (Dr. Ethelene Hal, office near [facility] in [place]), but decided it was ok to forego this at this time.

## 2013-02-25 ENCOUNTER — Other Ambulatory Visit: Payer: Self-pay | Admitting: Family Medicine

## 2013-02-25 MED ORDER — LOSARTAN POTASSIUM 100 MG PO TABS: 100.0000 mg | ORAL_TABLET | Freq: Every day | ORAL | Status: DC

## 2013-03-04 ENCOUNTER — Ambulatory Visit (INDEPENDENT_AMBULATORY_CARE_PROVIDER_SITE_OTHER): Payer: Medicare Other | Admitting: Family Medicine

## 2013-03-04 ENCOUNTER — Encounter: Payer: Self-pay | Admitting: Family Medicine

## 2013-03-04 VITALS — BP 166/86 | HR 70 | Temp 98.7°F | Resp 18 | Ht 59.5 in | Wt 153.0 lb

## 2013-03-04 DIAGNOSIS — IMO0001 Reserved for inherently not codable concepts without codable children: Secondary | ICD-10-CM

## 2013-03-04 DIAGNOSIS — I10 Essential (primary) hypertension: Secondary | ICD-10-CM

## 2013-03-04 DIAGNOSIS — L309 Dermatitis, unspecified: Secondary | ICD-10-CM

## 2013-03-04 DIAGNOSIS — E785 Hyperlipidemia, unspecified: Secondary | ICD-10-CM

## 2013-03-04 DIAGNOSIS — L259 Unspecified contact dermatitis, unspecified cause: Secondary | ICD-10-CM

## 2013-03-04 DIAGNOSIS — E039 Hypothyroidism, unspecified: Secondary | ICD-10-CM

## 2013-03-04 DIAGNOSIS — Z23 Encounter for immunization: Secondary | ICD-10-CM

## 2013-03-04 DIAGNOSIS — E119 Type 2 diabetes mellitus without complications: Secondary | ICD-10-CM

## 2013-03-04 LAB — LIPID PANEL
HDL: 44.6 mg/dL (ref >39.00)
Total CHOL/HDL Ratio: 6
Triglycerides: 218 mg/dL — ABNORMAL HIGH (ref 0.0–149.0)
VLDL: 43.6 mg/dL — ABNORMAL HIGH (ref 0.0–40.0)

## 2013-03-04 LAB — COMPREHENSIVE METABOLIC PANEL
ALT: 30 U/L (ref 0–35)
AST: 21 U/L (ref 0–37)
Albumin: 4.3 g/dL (ref 3.5–5.2)
Alkaline Phosphatase: 57 U/L (ref 39–117)
BUN: 16 mg/dL (ref 6–23)
Calcium: 9.3 mg/dL (ref 8.4–10.5)
Chloride: 102 mEq/L (ref 96–112)
Glucose, Bld: 123 mg/dL — ABNORMAL HIGH (ref 70–99)
Potassium: 4.7 mEq/L (ref 3.5–5.1)
Sodium: 139 mEq/L (ref 135–145)
Total Protein: 6.8 g/dL (ref 6.0–8.3)

## 2013-03-04 NOTE — Progress Notes (Signed)
OFFICE NOTE Pre visit review using our clinic review tool, if applicable. No additional management support is needed unless otherwise documented below in the visit note.  03/06/2013  CC:  Chief Complaint  Patient presents with  . Follow-up     HPI: Patient is a 67 y.o. [name] who is here for f/u HTN, hypothyroidism, diet-controlled DM 2. About 3 hours ago she had a cup of coffee with small teaspoon of sugar, 1% milk in it as well. Says rash she has had recently is now resolved--see past notes.  Feeling otherwise well.  BP checks at home show avg <140/80. Fasting glucose avg 100-110.   She has historically refused meds for DM 2 and high cholesterol.  ROS: no HAs, no dizziness, no vision complaints, no CP, no SOB, no rash, no melena or hematochezia  Pertinent PMH:  Past Medical History  Diagnosis Date  . HTN (hypertension)   . Hypothyroidism   . Depression   . Congenital sensorineural hearing loss     deaf in right ear; mild presbycusis in left ear (audiologist 2014)  . CAP (community acquired pneumonia) 02/2010  . Beta thalassemia, heterozygous   . Somnolence, daytime, controlled 04/22/2012  . Hyperlipidemia, mixed     Refuses meds  . Type II or unspecified type diabetes mellitus without mention of complication, uncontrolled     08/2012 pt refused to start meds despite rising HbA1c on diet only  . Osteopenia     DEXAs 2003-2009  . Vitamin D deficiency    Past Surgical History  Procedure Laterality Date  . Breast biopsy      Left breast biopsy x 2 (fibrocystic/benign)  . Tonsillectomy and adenoidectomy      as a child  . Colonoscopy  2008    Normal.  10 yr recall.   History   Social History Narrative   Married, 2 daughters, 3 grandchildren.   Retired Radiation protection practitioner.  Relocated from [place] around 2010.   No T/A/Ds.  Exercise: sedentary life + no formal exercise at all.   Diet: regular                MEDS:  Outpatient Prescriptions Prior to Visit   Medication Sig Dispense Refill  . Calcium Carbonate-Vitamin D (CALCIUM 600+D) 600-400 MG-UNIT per tablet Take 1 tablet by mouth 2 (two) times daily.      . citalopram (CELEXA) 10 MG tablet Take 1 tablet (10 mg total) by mouth daily.  90 tablet  1  . levothyroxine (SYNTHROID, LEVOTHROID) 125 MCG tablet Take 1 tablet (125 mcg total) by mouth daily.  90 tablet  1  . losartan (COZAAR) 100 MG tablet Take 1 tablet (100 mg total) by mouth daily.  90 tablet  1  . Omega-3 Fatty Acids (FISH OIL) 1000 MG CAPS Take 1 capsule by mouth 2 (two) times daily.      . fluticasone (CUTIVATE) 0.05 % cream Apply topically 2 (two) times daily. Apply bid to affected areas as needed for contact dermatitis rash  30 g  1  . loratadine (CLARITIN) 10 MG tablet Take 10 mg by mouth daily.      . montelukast (SINGULAIR) 10 MG tablet Take 1 tablet by mouth At bedtime.      . predniSONE (DELTASONE) 20 MG tablet 2 tabs po qd x 7d, then 1 tab po qd x 7d, then 1/2 tab po qd x 8d  25 tablet  0   No facility-administered medications prior to visit.  **No longer  taking prednisone as listed above  PE: Blood pressure 166/86, pulse 70, temperature 98.7 F (37.1 C), temperature source Temporal, resp. rate 18, height 4' 11.5" (1.511 m), weight 153 lb (69.4 kg), SpO2 99.00%. Gen: Alert, well appearing.  Patient is oriented to person, place, time, and situation. AFFECT: pleasant, lucid thought and speech. No further exam today.  IMPRESSION AND PLAN:  Type II or unspecified type diabetes mellitus without mention of complication, uncontrolled HbA1c today. She has historically refused meds for this dx. Will discuss/offer pneumovax next f/u visit in 63mo.  HTN (hypertension), benign The current medical regimen is effective;  continue present plan and medications. Check BMET today.  Hypothyroidism TSH due today. If this one is normal then will do this test annually.  Eczematous dermatitis Resolved.  Flu vaccine IM today.  An  After Visit Summary was printed and given to the patient.  FOLLOW UP: 63mo for CPE

## 2013-03-05 LAB — LDL CHOLESTEROL, DIRECT: Direct LDL: 176.6 mg/dL

## 2013-03-06 ENCOUNTER — Encounter: Payer: Self-pay | Admitting: Family Medicine

## 2013-03-06 NOTE — Assessment & Plan Note (Signed)
TSH due today. If this one is normal then will do this test annually.

## 2013-03-06 NOTE — Assessment & Plan Note (Signed)
HbA1c today. She has historically refused meds for this dx. Will discuss/offer pneumovax next f/u visit in 6mo.

## 2013-03-06 NOTE — Assessment & Plan Note (Signed)
The current medical regimen is effective;  continue present plan and medications. Check BMET today. 

## 2013-03-06 NOTE — Assessment & Plan Note (Signed)
Resolved

## 2013-04-23 ENCOUNTER — Encounter: Payer: Self-pay | Admitting: Family Medicine

## 2013-04-23 ENCOUNTER — Ambulatory Visit (INDEPENDENT_AMBULATORY_CARE_PROVIDER_SITE_OTHER): Payer: Medicare Other | Admitting: Family Medicine

## 2013-04-23 VITALS — Ht 59.5 in | Wt 153.0 lb

## 2013-04-23 DIAGNOSIS — E1165 Type 2 diabetes mellitus with hyperglycemia: Principal | ICD-10-CM

## 2013-04-23 DIAGNOSIS — IMO0001 Reserved for inherently not codable concepts without codable children: Secondary | ICD-10-CM

## 2013-04-23 NOTE — Patient Instructions (Signed)
-   Saturated fat helps to keep blood sugar up longer.    - Sat fat comes from animal fats mainly (cheese, cream, butter, red meat, processed meats) - What slows down digestion:  - Fiber  - Fat (olive oil, nuts, seeds, avocado, fish)  - Protein   - When digestion is slower, there is less chance of spiking blood sugar. - Limit carb foods to 1-2 per meal and 1 per snack.   - One carb portion/exchange  = 1 slice bread (or equivalent)       = 1/2 cup of "scoopable" carb's       = 15 g of total carb on the label - Walk at least 20 minutes, preferably after your mid-day meal.   - Food & exercise record, as well as BG log.  (Write down your weekly BG averages.)  - Food record:  What, how much, and what time.    - Ex record: # of minutes you walk.   ([email or web address]:  Child nutrition)

## 2013-04-23 NOTE — Progress Notes (Signed)
Medical Nutrition Therapy:  Appt start time: 1330 end time:  1430.  Assessment:  Primary concerns today: Weight management and Blood sugar control.  Mackenzie Wood was more active over the summer, but her weight has been stable at 153 lb for the past few months.  Food choices, per diet history obtained today, suggest she has an excellent foundation, but there are ways in which she can improve the glycemic content of her diet.  She is not exercising at all currently, which I told her would also help to manage BG levels.  Mackenzie Wood did not remember the recommendation to limit starches at each meal, nor did she remember what represents a carb portion (exchange), which we reviewed today.  FBG have been ~107-130.  24-hr recall:  (Up at 6 AM) B (6 AM)-  1 slc bread, 1/2 tbsp peanut butter, coffee w/ 1 tsp sugar, cream Snk (10 AM)-  1 clementine L (12 PM)-  1/2 c noodles, 1 tsp butter, 2 meat balls, water Snk (3 PM)-  Movie popcorn, water D (6 PM)-  2 slc mushrm pizza, 3 oz wine Snk (8:30)-  1 apple w/ 1/2 tbsp peanut butter  Yesterday was not especially typical; she cares for her 6-YO grandson on MWF, & they shared two meals and movie popcorn yesterday.  She usually has veg's at least 1 X day, and has been having veg's 2 X day on some days recently.    Progress Towards Goal(s):  In progress.   Nutritional Diagnosis:  Stable progress noted on NI-5.8.3 Inappropriate intake of types of carbohydrates (specify):   As related to fruits and veg's.  As evidenced by usual intake of vegetables only once a day.    Intervention:  Nutrition education.  Monitoring/Evaluation:  Dietary intake, exercise, BG, and body weight in 4 week(s).

## 2013-05-08 ENCOUNTER — Other Ambulatory Visit: Payer: Self-pay

## 2013-05-08 DIAGNOSIS — Z1231 Encounter for screening mammogram for malignant neoplasm of breast: Secondary | ICD-10-CM

## 2013-05-22 ENCOUNTER — Telehealth: Payer: Self-pay | Admitting: Family Medicine

## 2013-05-22 MED ORDER — LOSARTAN POTASSIUM 100 MG PO TABS: 100.0000 mg | ORAL_TABLET | Freq: Every day | ORAL | Status: DC

## 2013-05-22 NOTE — Telephone Encounter (Signed)
Patient called to let us know she is switching to [facility] Rx coverage.  She needs her Losartan Rx faxed to [phone].  Rx faxed.

## 2013-05-29 ENCOUNTER — Ambulatory Visit
Admission: RE | Admit: 2013-05-29 | Discharge: 2013-05-29 | Disposition: A | Discharge status: Home / Self Care | Payer: Medicare Other | Source: Ambulatory Visit

## 2013-05-29 DIAGNOSIS — Z1231 Encounter for screening mammogram for malignant neoplasm of breast: Secondary | ICD-10-CM

## 2013-06-26 ENCOUNTER — Other Ambulatory Visit: Payer: Self-pay | Admitting: Family Medicine

## 2013-06-26 MED ORDER — LEVOTHYROXINE SODIUM 125 MCG PO TABS: 125.0000 ug | ORAL_TABLET | Freq: Every day | ORAL | Status: DC

## 2013-06-26 NOTE — Telephone Encounter (Signed)
Pt request refill of synthroid 154mcg to be sent 90 day supply to [facility].  Rx sent electronically.  Pt aware.

## 2013-08-21 ENCOUNTER — Other Ambulatory Visit: Payer: Self-pay | Admitting: Family Medicine

## 2013-08-21 NOTE — Telephone Encounter (Signed)
Patient called requesting renewal on test strips and lancets.  I called [facility] to renew these x 2 refills.  Patient aware.

## 2013-09-02 ENCOUNTER — Encounter: Payer: Self-pay | Admitting: Family Medicine

## 2013-09-02 ENCOUNTER — Ambulatory Visit (INDEPENDENT_AMBULATORY_CARE_PROVIDER_SITE_OTHER): Payer: Medicare Other | Admitting: Family Medicine

## 2013-09-02 VITALS — BP 128/84 | HR 71 | Temp 99.0°F | Resp 16 | Ht 59.5 in | Wt 146.0 lb

## 2013-09-02 DIAGNOSIS — E785 Hyperlipidemia, unspecified: Secondary | ICD-10-CM | POA: Diagnosis not present

## 2013-09-02 DIAGNOSIS — I10 Essential (primary) hypertension: Secondary | ICD-10-CM | POA: Diagnosis not present

## 2013-09-02 DIAGNOSIS — Z8659 Personal history of other mental and behavioral disorders: Secondary | ICD-10-CM

## 2013-09-02 DIAGNOSIS — Z1211 Encounter for screening for malignant neoplasm of colon: Secondary | ICD-10-CM

## 2013-09-02 DIAGNOSIS — IMO0001 Reserved for inherently not codable concepts without codable children: Secondary | ICD-10-CM

## 2013-09-02 DIAGNOSIS — E1165 Type 2 diabetes mellitus with hyperglycemia: Principal | ICD-10-CM

## 2013-09-02 LAB — COMPREHENSIVE METABOLIC PANEL
ALK PHOS: 73 U/L (ref 39–117)
ALT: 21 U/L (ref 0–35)
AST: 16 U/L (ref 0–37)
Albumin: 4.2 g/dL (ref 3.5–5.2)
BILIRUBIN TOTAL: 0.7 mg/dL (ref 0.2–1.2)
BUN: 15 mg/dL (ref 6–23)
CO2: 30 mEq/L (ref 19–32)
Calcium: 9.4 mg/dL (ref 8.4–10.5)
Chloride: 104 mEq/L (ref 96–112)
Creatinine, Ser: 0.6 mg/dL (ref 0.4–1.2)
GFR: 113.7 mL/min (ref >60.00)
GLUCOSE: 113 mg/dL — AB (ref 70–99)
Potassium: 4.5 mEq/L (ref 3.5–5.1)
SODIUM: 139 meq/L (ref 135–145)
TOTAL PROTEIN: 6.5 g/dL (ref 6.0–8.3)

## 2013-09-02 LAB — HEMOGLOBIN A1C: Hgb A1c MFr Bld: 6.9 % — ABNORMAL HIGH (ref 4.6–6.5)

## 2013-09-02 LAB — LIPID PANEL
Cholesterol: 199 mg/dL (ref 0–200)
HDL: 41 mg/dL (ref >39.00)
LDL CALC: 121 mg/dL — AB (ref 0–99)
NONHDL: 158
Total CHOL/HDL Ratio: 5
Triglycerides: 185 mg/dL — ABNORMAL HIGH (ref 0.0–149.0)
VLDL: 37 mg/dL (ref 0.0–40.0)

## 2013-09-02 NOTE — Progress Notes (Addendum)
Office Note 09/02/2013  CC:  Chief Complaint  Patient presents with  . Annual Exam    fasting    HPI:  Mackenzie Wood is a 68 y.o. White female who is here for CPE/follow up chronic probs: DM 2, hypothyroidism, hyperlipidemia.   Home glucose checks: fastings every 3 d or so: avg coming down to about high 90's.   Paying attention to diet more lately. No tingling, burning, or numbness in feet. Almost due for diab retpthy screening.  She weened herself off of her citalopram since I saw her last and says she feels fine.    Past Medical History  Diagnosis Date  . HTN (hypertension)   . Hypothyroidism   . Depression   . Congenital sensorineural hearing loss     deaf in right ear; mild presbycusis in left ear (audiologist 2014)  . CAP (community acquired pneumonia) 02/2010  . Beta thalassemia, heterozygous   . Somnolence, daytime, controlled 04/22/2012  . Hyperlipidemia, mixed     Refuses meds  . Type II or unspecified type diabetes mellitus without mention of complication, uncontrolled     08/2012 pt refused to start meds despite rising HbA1c on diet only  . Osteopenia     DEXAs 2003-2013--pt has declined bisphosphonates  . Vitamin D deficiency     Past Surgical History  Procedure Laterality Date  . Breast biopsy      Left breast biopsy x 2 (fibrocystic/benign)  . Tonsillectomy and adenoidectomy      as a child  . Colonoscopy  2008    Normal.  10 yr recall.    Family History  Problem Relation Age of Onset  . Hypertension Mother   . Hypertension Father     History   Social History  . Marital Status: Married    Spouse Name: N/A    Number of Children: N/A  . Years of Education: N/A   Occupational History  . Not on file.   Social History Main Topics  . Smoking status: Never Smoker   . Smokeless tobacco: Never Used  . Alcohol Use: No  . Drug Use: No  . Sexual Activity: Not on file   Other Topics Concern  . Not on file   Social History Narrative   Married, 2 daughters, 3 grandchildren.   Retired Archivist.  Relocated from [place] around 2010.   No T/A/Ds.  Exercise: sedentary life + no formal exercise at all.   Diet: regular                Outpatient Prescriptions Prior to Visit  Medication Sig Dispense Refill  . Calcium Carbonate-Vitamin D (CALCIUM 600+D) 600-400 MG-UNIT per tablet Take 1 tablet by mouth 2 (two) times daily.      Marland Kitchen levothyroxine (SYNTHROID, LEVOTHROID) 125 MCG tablet Take 1 tablet (125 mcg total) by mouth daily.  90 tablet  1  . losartan (COZAAR) 100 MG tablet Take 1 tablet (100 mg total) by mouth daily.  90 tablet  1  . Omega-3 Fatty Acids (FISH OIL) 1000 MG CAPS Take 1 capsule by mouth 2 (two) times daily.      Marland Kitchen loratadine (CLARITIN) 10 MG tablet Take 10 mg by mouth daily.      . montelukast (SINGULAIR) 10 MG tablet Take 1 tablet by mouth At bedtime.      . citalopram (CELEXA) 10 MG tablet Take 5 mg by mouth daily.      . fluticasone (CUTIVATE) 0.05 % cream Apply topically 2 (two)  times daily. Apply bid to affected areas as needed for contact dermatitis rash  30 g  1   No facility-administered medications prior to visit.    No Known Allergies  ROS Review of Systems  Constitutional: Negative for fever, chills, appetite change and fatigue.  HENT: Negative for congestion, dental problem, ear pain and sore throat.   Eyes: Negative for discharge, redness and visual disturbance.  Respiratory: Negative for cough, chest tightness, shortness of breath and wheezing.   Cardiovascular: Negative for chest pain, palpitations and leg swelling.  Gastrointestinal: Negative for nausea, vomiting, abdominal pain, diarrhea and blood in stool.  Genitourinary: Negative for dysuria, urgency, frequency, hematuria, flank pain and difficulty urinating.  Musculoskeletal: Negative for arthralgias, back pain, joint swelling, myalgias and neck stiffness.  Skin: Negative for pallor and rash.  Neurological: Negative for  dizziness, speech difficulty, weakness and headaches.  Hematological: Negative for adenopathy. Does not bruise/bleed easily.  Psychiatric/Behavioral: Negative for confusion and sleep disturbance. The patient is not nervous/anxious.     PE; Blood pressure 128/84, pulse 71, temperature 99 F (37.2 C), temperature source Temporal, resp. rate 16, height 4' 11.5" (1.511 m), weight 146 lb (66.225 kg), SpO2 97.00%. Gen: Alert, well appearing.  Patient is oriented to person, place, time, and situation. AFFECT: pleasant, lucid thought and speech. ENT: Ears: EACs clear, normal epithelium.  TMs with good light reflex and landmarks bilaterally.  Eyes: no injection, icteris, swelling, or exudate.  EOMI, PERRLA. Nose: no drainage or turbinate edema/swelling.  No injection or focal lesion.  Mouth: lips without lesion/swelling.  Oral mucosa pink and moist.  Dentition intact and without obvious caries or gingival swelling.  Oropharynx without erythema, exudate, or swelling.  Neck: supple/nontender.  No LAD, mass, or TM.  Carotid pulses 2+ bilaterally, without bruits. CV: RRR, no m/r/g.   LUNGS: CTA bilat, nonlabored resps, good aeration in all lung fields. ABD: soft, NT, ND, BS normal.  No hepatospenomegaly or mass.  No bruits. EXT: no clubbing, cyanosis, or edema.  Musculoskeletal: no joint swelling, erythema, warmth, or tenderness.  ROM of all joints intact. Skin - no sores or suspicious lesions or rashes or color changes Foot exam - both normal; no swelling, tenderness or skin or vascular lesions. Color and temperature is normal. Sensation is intact. Peripheral pulses are palpable. Toenails are normal.   Pertinent labs: None today  ASSESSMENT AND PLAN:   1) DM 2, improving as per her fasting numbers. Feet exam normal today.  Hba1c and urine microalb/cr today.  Pt refused prevnar. Declined my recommendation of meds for DM in the recent past.   2) HTN; The current medical regimen is effective;   continue present plan and medications. Cr/lytes today.  3) Hyperlipidemia: she's made some dietary changes and has been a bit more active.  Declined my recommendation of meds in the recent past.  FLP today, AST/ALT today.  4) hypothyroidism:  Lab Results  Component Value Date   TSH 0.69 03/04/2013  Recheck TSH next f/u in 6 mo.  5) Colon ca screening: UTD on colonoscopy.   Discussed iFOB today and she was agreeable--ordered.  6) Cerv ca screening: she reports all normal paps in the past.  No hx of abnormal bleeding. Last pap about 2 yrs ago.  Plan on pap here in 1 yr.  7) Hx of depression: doing great since weening herself off of citalopram.  An After Visit Summary was printed and given to the patient.  FOLLOW UP:  6 mo

## 2013-09-02 NOTE — Progress Notes (Signed)
Pre visit review using our clinic review tool, if applicable. No additional management support is needed unless otherwise documented below in the visit note. 

## 2013-09-03 ENCOUNTER — Telehealth: Payer: Self-pay | Admitting: Family Medicine

## 2013-09-03 ENCOUNTER — Other Ambulatory Visit: Payer: Self-pay | Admitting: Family Medicine

## 2013-09-03 DIAGNOSIS — Z1211 Encounter for screening for malignant neoplasm of colon: Secondary | ICD-10-CM

## 2013-09-03 LAB — MICROALBUMIN / CREATININE URINE RATIO
Creatinine,U: 122.5 mg/dL
MICROALB UR: 2 mg/dL — AB (ref 0.0–1.9)
MICROALB/CREAT RATIO: 1.6 mg/g (ref 0.0–30.0)

## 2013-09-03 NOTE — Telephone Encounter (Signed)
Relevant patient education mailed to patient.  

## 2013-09-05 ENCOUNTER — Other Ambulatory Visit: Payer: Medicare Other

## 2013-09-05 NOTE — Addendum Note (Signed)
Addended by: Jannette Spanner on: 09/05/2013 02:53 PM   Modules accepted: Orders

## 2013-09-06 ENCOUNTER — Other Ambulatory Visit: Payer: Self-pay | Admitting: Family Medicine

## 2013-09-06 ENCOUNTER — Other Ambulatory Visit (INDEPENDENT_AMBULATORY_CARE_PROVIDER_SITE_OTHER): Payer: Medicare Other

## 2013-09-06 DIAGNOSIS — Z1211 Encounter for screening for malignant neoplasm of colon: Secondary | ICD-10-CM

## 2013-09-06 LAB — FECAL OCCULT BLOOD, IMMUNOCHEMICAL: Fecal Occult Bld: NEGATIVE

## 2013-09-16 ENCOUNTER — Ambulatory Visit (INDEPENDENT_AMBULATORY_CARE_PROVIDER_SITE_OTHER): Payer: Medicare Other | Admitting: Family Medicine

## 2013-09-16 ENCOUNTER — Encounter: Payer: Self-pay | Admitting: Family Medicine

## 2013-09-16 VITALS — BP 150/85 | HR 79 | Temp 98.8°F | Resp 18 | Ht 59.5 in | Wt 146.0 lb

## 2013-09-16 DIAGNOSIS — T6391XA Toxic effect of contact with unspecified venomous animal, accidental (unintentional), initial encounter: Secondary | ICD-10-CM | POA: Diagnosis not present

## 2013-09-16 DIAGNOSIS — T63484A Toxic effect of venom of other arthropod, undetermined, initial encounter: Secondary | ICD-10-CM

## 2013-09-16 DIAGNOSIS — T65891A Toxic effect of other specified substances, accidental (unintentional), initial encounter: Secondary | ICD-10-CM | POA: Diagnosis not present

## 2013-09-16 DIAGNOSIS — T63481A Toxic effect of venom of other arthropod, accidental (unintentional), initial encounter: Secondary | ICD-10-CM | POA: Insufficient documentation

## 2013-09-16 MED ORDER — PREDNISONE 20 MG PO TABS: ORAL_TABLET | ORAL | Status: DC

## 2013-09-16 NOTE — Progress Notes (Signed)
OFFICE NOTE  09/16/2013  CC:  Chief Complaint  Patient presents with  . Insect Bite    x Saturday on back of R leg     HPI: Patient is a 68 y.o. [name] who is here for insect sting. Sustained 2 d/a while mowing, didn't see what it was but suspects yellow jacket or what.   Right calf; painful, red, swollen, hot all over calf;  Blister at site recently popped, weeps clear fluid slowly.  +itchy. Describes a growing sensitivity to insect bites, stings ---take months to heal but has never required abx or steroids. Has not taken any antihistamine. ROS: fatigue the day after the sting, now feels well.  Pertinent PMH:  Past medical, surgical, social, and family history reviewed and no changes are noted since last office visit.  MEDS:  Outpatient Prescriptions Prior to Visit  Medication Sig Dispense Refill  . Calcium Carbonate-Vitamin D (CALCIUM 600+D) 600-400 MG-UNIT per tablet Take 1 tablet by mouth 2 (two) times daily.      Marland Kitchen levothyroxine (SYNTHROID, LEVOTHROID) 125 MCG tablet Take 1 tablet (125 mcg total) by mouth daily.  90 tablet  1  . losartan (COZAAR) 100 MG tablet Take 1 tablet (100 mg total) by mouth daily.  90 tablet  1  . Omega-3 Fatty Acids (FISH OIL) 1000 MG CAPS Take 1 capsule by mouth 2 (two) times daily.       No facility-administered medications prior to visit.    PE: Blood pressure 150/85, pulse 79, temperature 98.8 F (37.1 C), temperature source Temporal, resp. rate 18, height 4' 11.5" (1.511 m), weight 146 lb (66.225 kg), SpO2 96.00%. Gen: Alert, well appearing.  Patient is oriented to person, place, time, and situation. Right calf/ankle with mild warmth, 2-3 popped vesicles on mid calf region w/out purulent exudate.  No ulceration into subQ tissue.  No signif erythema or tenderness to calf or ankle.  No streaking.   IMPRESSION AND PLAN:  Local hypersensitivity rxn to insect sting. Benadryl 25mg  q6h po x 2d, then q6h prn. Prednisone 40mg  qd x 3d,  then 20mg  qd x 3d. Cover wound, elevate leg 71min a couple of times a day. Signs/symptoms to call or return for were reviewed and pt expressed understanding.  An After Visit Summary was printed and given to the patient.  FOLLOW UP: prn

## 2013-09-16 NOTE — Progress Notes (Signed)
Pre visit review using our clinic review tool, if applicable. No additional management support is needed unless otherwise documented below in the visit note. 

## 2013-10-22 ENCOUNTER — Encounter: Payer: Self-pay | Admitting: Nurse Practitioner

## 2013-10-22 ENCOUNTER — Ambulatory Visit (INDEPENDENT_AMBULATORY_CARE_PROVIDER_SITE_OTHER): Payer: Medicare Other | Admitting: Nurse Practitioner

## 2013-10-22 VITALS — BP 129/73 | HR 70 | Temp 97.6°F | Ht 59.5 in | Wt 146.0 lb

## 2013-10-22 DIAGNOSIS — L255 Unspecified contact dermatitis due to plants, except food: Secondary | ICD-10-CM

## 2013-10-22 MED ORDER — PREDNISONE 10 MG PO TABS: ORAL_TABLET | ORAL | Status: DC

## 2013-10-22 NOTE — Progress Notes (Signed)
Pre visit review using our clinic review tool, if applicable. No additional management support is needed unless otherwise documented below in the visit note. 

## 2013-10-22 NOTE — Patient Instructions (Signed)
Start prednisone. Tomorrow take early in day so it will not keep you awake. Use ice packs to calm itching. Also, you may apply topical benadryl. Start claritin 10 mg daily for 2 weeks. Skin care: wash with mild soap daily-Dove bar soap. Let us know if you see signs of infection: red-on-red or purulent drainage. Poison [employer] ivy is a inflammation of the skin (contact dermatitis) caused by touching the allergens on the leaves of the ivy plant following previous exposure to the plant. The rash usually appears 48 hours after exposure. The rash is usually bumps (papules) or blisters (vesicles) in a linear pattern. Depending on your own sensitivity, the rash may simply cause redness and itching, or it may also progress to blisters which may break open. These must be well cared for to prevent secondary bacterial (germ) infection, followed by scarring. Keep any open areas dry, clean, dressed, and covered with an antibacterial ointment if needed. The eyes may also get puffy. The puffiness is worst in the morning and gets better as the day progresses. This dermatitis usually heals without scarring, within 2 to 3 weeks without treatment. HOME CARE INSTRUCTIONS  Thoroughly wash with soap and water as soon as you have been exposed to poison ivy. You have about one half hour to remove the plant resin before it will cause the rash. This washing will destroy the oil or antigen on the skin that is causing, or will cause, the rash. Be sure to wash under your fingernails as any plant resin there will continue to spread the rash. Do not rub skin vigorously when washing affected area. Poison ivy cannot spread if no oil from the plant remains on your body. A rash that has progressed to weeping sores will not spread the rash unless you have not washed thoroughly. It is also important to wash any clothes you have been wearing as these may carry active allergens. The rash will return if you wear the unwashed clothing, even several  days later. Avoidance of the plant in the future is the best measure. Poison ivy plant can be recognized by the number of leaves. Generally, poison ivy has three leaves with flowering branches on a single stem. Diphenhydramine may be purchased over the counter and used as needed for itching. Do not drive with this medication if it makes you drowsy.Ask your caregiver about medication for children. SEEK MEDICAL CARE IF:  Open sores develop.  Redness spreads beyond area of rash.  You notice purulent (pus-like) discharge.  You have increased pain.  Other signs of infection develop (such as fever). Document Released: 03/11/2000 Document Revised: 06/06/2011 Document Reviewed: 01/28/2009 [facility] Patient Information 2015 [place], [place]. This information is not intended to replace advice given to you by your health care provider. Make sure you discuss any questions you have with your health care provider.

## 2013-10-22 NOTE — Progress Notes (Signed)
   Subjective:    Patient ID: Mackenzie Wood, female    DOB: 05-06-1945, 69 y.o.   MRN: [id number]  Rash This is a new problem. The current episode started in the past 7 days. The problem has been gradually worsening since onset. The affected locations include the torso, left arm, left upper leg, left lower leg, right arm, right upper leg and right lower leg. The rash is characterized by blistering, itchiness and redness. She was exposed to plant contact. Pertinent negatives include no congestion, cough, fatigue, fever, shortness of breath, sore throat or vomiting. Past treatments include anti-itch cream and antihistamine. The treatment provided mild relief.      Review of Systems  Constitutional: Negative for fever and fatigue.  HENT: Negative for congestion, sore throat and voice change.   Eyes: Negative for redness.  Respiratory: Negative for cough, chest tightness, shortness of breath and wheezing.   Gastrointestinal: Negative for vomiting.  Endocrine:       Diabetes, no meds . Diet interventions, lost 15 lbs. A1C improved 1 point.  Skin: Positive for rash.       Objective:   Physical Exam  Vitals reviewed. Constitutional: She is oriented to person, place, and time. She appears well-developed and well-nourished. No distress.  HENT:  Head: Normocephalic and atraumatic.  Eyes: Conjunctivae are normal. Right eye exhibits no discharge. Left eye exhibits no discharge.  Cardiovascular: Normal rate.   Pulmonary/Chest: Effort normal. No respiratory distress.  Neurological: She is alert and oriented to person, place, and time.  Skin: Skin is warm and dry. Rash noted.  Linear blistering rash arms, legs, trunk. Face spared.  Psychiatric: She has a normal mood and affect. Her behavior is normal. Thought content normal.          Assessment & Plan:  1. Plant dermatitis - predniSONE (DELTASONE) 10 MG tablet; Take 4Tpo qam X 3d, then 3T po qam X 3d, then 2T po qd X 3d, then 1T po qam X  3d.  Dispense: 30 tablet; Refill: 0 See pt instructions. F/u PRN

## 2013-11-04 ENCOUNTER — Other Ambulatory Visit: Payer: Self-pay

## 2013-11-04 MED ORDER — LEVOTHYROXINE SODIUM 125 MCG PO TABS: 125.0000 ug | ORAL_TABLET | Freq: Every day | ORAL | Status: DC

## 2013-11-04 MED ORDER — LOSARTAN POTASSIUM 100 MG PO TABS: 100.0000 mg | ORAL_TABLET | Freq: Every day | ORAL | Status: DC

## 2013-11-04 NOTE — Telephone Encounter (Signed)
[facility] [place] [facility] day supply on pt's medication. She left her medications at home. i sent in a five day supply for pt.

## 2013-12-04 ENCOUNTER — Other Ambulatory Visit: Payer: Self-pay | Admitting: Family Medicine

## 2013-12-04 MED ORDER — LOSARTAN POTASSIUM 100 MG PO TABS: 100.0000 mg | ORAL_TABLET | Freq: Every day | ORAL | Status: DC

## 2013-12-04 NOTE — Telephone Encounter (Signed)
Losartan 100mg  sent to [facility] for 90 day x 1 rf per patient request.

## 2013-12-20 ENCOUNTER — Other Ambulatory Visit: Payer: Self-pay | Admitting: Family Medicine

## 2013-12-20 MED ORDER — LEVOTHYROXINE SODIUM 125 MCG PO TABS: 125.0000 ug | ORAL_TABLET | Freq: Every day | ORAL | Status: DC

## 2013-12-24 LAB — HM DIABETES EYE EXAM

## 2013-12-26 ENCOUNTER — Telehealth: Payer: Self-pay | Admitting: Family Medicine

## 2013-12-26 MED ORDER — CICLOPIROX 0.77 % EX GEL: CUTANEOUS | Status: DC

## 2013-12-26 NOTE — Telephone Encounter (Signed)
Pt LMOM requesting RF for ciclopirox gel 0.77%.  She states she has it for toenail fungus and uses it only PRN.  Her current tube is 30 grams.  Please advise rf.

## 2013-12-26 NOTE — Telephone Encounter (Signed)
Rx eRxd.  

## 2014-01-07 ENCOUNTER — Telehealth: Payer: Self-pay | Admitting: Family Medicine

## 2014-01-07 NOTE — Telephone Encounter (Signed)
Patient started getting hives and didn't know if she needed to come in for OV or not.  I told her to try antihistamine and if that does not work to come into office for appointment.

## 2014-01-07 NOTE — Telephone Encounter (Signed)
Agree 

## 2014-01-24 ENCOUNTER — Ambulatory Visit (INDEPENDENT_AMBULATORY_CARE_PROVIDER_SITE_OTHER): Payer: Medicare Other | Admitting: Family Medicine

## 2014-01-24 ENCOUNTER — Encounter: Payer: Self-pay | Admitting: Family Medicine

## 2014-01-24 VITALS — BP 174/85 | HR 69 | Temp 98.4°F | Resp 18 | Ht 59.5 in | Wt 146.0 lb

## 2014-01-24 DIAGNOSIS — L509 Urticaria, unspecified: Secondary | ICD-10-CM

## 2014-01-24 MED ORDER — PREDNISONE 20 MG PO TABS: ORAL_TABLET | ORAL | Status: DC

## 2014-01-24 MED ORDER — FEXOFENADINE HCL 180 MG PO TABS: 180.0000 mg | ORAL_TABLET | Freq: Every day | ORAL | Status: DC

## 2014-01-24 NOTE — Progress Notes (Signed)
Pre visit review using our clinic review tool, if applicable. No additional management support is needed unless otherwise documented below in the visit note. 

## 2014-01-24 NOTE — Progress Notes (Signed)
OFFICE NOTE  01/24/2014  CC:  Chief Complaint  Patient presents with  . Urticaria    x 3 weeks   HPI: Patient is a 68 y.o. [name] who is here for rash.   Says she has had daily hives for the last 3+ wks, worse in morning and evenings. Gets them in thighs/backs of legs, inferior of knee cap regions bilat, around hairline and sometimes face, around nape of neck, sometimes on palms/fingers.  These do itch.  They do wax and wane in appearance/intensity, seems to be worse the last 1 week.  The only thing that has changed in her life/routine/diet is the recent addition of an elderly family member living in her home and causing some stress: but she feels like this has begun to subside as she gets used to the routine of him living with her. No tongue, lips, or throat swelling, no wheezing or SOB or chest tightness.   Took benadryl the last 2 nights. Tried claritin a little last week.  No known contact irritants or allergens.  No new otc or rx meds.  She had no resp or GI illness that preceded onset of the problem with hives.  Pertinent PMH:  Past medical, surgical, social, and family history reviewed and no changes are noted since last office visit.  MEDS: Not taking prednisone listed below Outpatient Prescriptions Prior to Visit  Medication Sig Dispense Refill  . Calcium Carbonate-Vitamin D (CALCIUM 600+D) 600-400 MG-UNIT per tablet Take 1 tablet by mouth 2 (two) times daily.      Marland Kitchen levothyroxine (SYNTHROID, LEVOTHROID) 125 MCG tablet Take 1 tablet (125 mcg total) by mouth daily.  90 tablet  1  . losartan (COZAAR) 100 MG tablet Take 1 tablet (100 mg total) by mouth daily.  90 tablet  1  . Omega-3 Fatty Acids (FISH OIL) 1000 MG CAPS Take 1 capsule by mouth 2 (two) times daily.      . Ciclopirox 0.77 % gel Apply to affected areas bid prn  30 g  6  . predniSONE (DELTASONE) 10 MG tablet Take 4Tpo qam X 3d, then 3T po qam X 3d, then 2T po qd X 3d, then 1T po qam X 3d.  30 tablet  0    No facility-administered medications prior to visit.    PE: Blood pressure 174/85, pulse 69, temperature 98.4 F (36.9 C), temperature source Temporal, resp. rate 18, height 4' 11.5" (1.511 m), weight 146 lb (66.225 kg), SpO2 99.00%.  Pt examined with CMA Jacklynn Ganong as chaperone. Gen: Alert, well appearing.  Patient is oriented to person, place, time, and situation. ENT: lips, eyes, tongue, and pharynx without swelling. Neck: no LAD CV: RRR, no m/r/g.   LUNGS: CTA bilat, nonlabored resps, good aeration in all lung fields. EXT: no clubbing, cyanosis, or edema.  SKIN: splotchy hives spread diffusely over left shoulder, both forearms, both upper legs.  IMPRESSION AND PLAN:  Urticaria of unknown origin Suspect "idiopathic". Allegra 180mg  qd. Prednisone 40mg  qd x 7d, then 20mg  qd x 7d, then 10mg  qd x 8d. Signs/symptoms to call or return for were reviewed and pt expressed understanding.   An After Visit Summary was printed and given to the patient.  FOLLOW UP: 1 mo

## 2014-02-02 DIAGNOSIS — L508 Other urticaria: Secondary | ICD-10-CM | POA: Insufficient documentation

## 2014-02-02 NOTE — Assessment & Plan Note (Addendum)
Suspect "idiopathic". Allegra 180mg  qd. Prednisone 40mg  qd x 7d, then 20mg  qd x 7d, then 10mg  qd x 8d. Signs/symptoms to call or return for were reviewed and pt expressed understanding.

## 2014-02-25 ENCOUNTER — Ambulatory Visit (INDEPENDENT_AMBULATORY_CARE_PROVIDER_SITE_OTHER): Payer: Medicare Other | Admitting: Family Medicine

## 2014-02-25 ENCOUNTER — Encounter: Payer: Self-pay | Admitting: Family Medicine

## 2014-02-25 VITALS — BP 163/83 | HR 75 | Temp 99.2°F | Resp 18 | Ht 59.5 in | Wt 148.0 lb

## 2014-02-25 DIAGNOSIS — L089 Local infection of the skin and subcutaneous tissue, unspecified: Secondary | ICD-10-CM | POA: Diagnosis not present

## 2014-02-25 MED ORDER — AMOXICILLIN-POT CLAVULANATE 875-125 MG PO TABS: 1.0000 | ORAL_TABLET | Freq: Two times a day (BID) | ORAL | Status: DC

## 2014-02-25 MED ORDER — MUPIROCIN 2 % EX OINT: 1.0000 "application " | TOPICAL_OINTMENT | Freq: Three times a day (TID) | CUTANEOUS | Status: DC

## 2014-02-25 NOTE — Progress Notes (Signed)
Pre visit review using our clinic review tool, if applicable. No additional management support is needed unless otherwise documented below in the visit note. 

## 2014-02-25 NOTE — Progress Notes (Signed)
OFFICE NOTE  02/25/2014  CC:  Chief Complaint  Patient presents with  . Laceration    on Middle finger of R hand   HPI: Patient is a 68 y.o. [name] who is here for laceration on middle finger of right hand sustained about a week ago.  Unclear how she cut it--?doing dishes maybe?--but it was tiny and located on knuckle and was no issue until a few days ago when it began to hurt a little, turn purple/red, swell some.  It has worsened since then.  She soaked in water yesterday.  Today it hurts quite a bit/throbs.  No drainage.  No fever or malaise. No motrin or tylenol today.  Pertinent PMH:  Past medical, surgical, social, and family history reviewed and no changes are noted since last office visit.  MEDS: No longer taking prednisone listed below Outpatient Prescriptions Prior to Visit  Medication Sig Dispense Refill  . Calcium Carbonate-Vitamin D (CALCIUM 600+D) 600-400 MG-UNIT per tablet Take 1 tablet by mouth 2 (two) times daily.    . Ciclopirox 0.77 % gel Apply to affected areas bid prn 30 g 6  . fexofenadine (ALLEGRA) 180 MG tablet Take 1 tablet (180 mg total) by mouth daily. 30 tablet 6  . levothyroxine (SYNTHROID, LEVOTHROID) 125 MCG tablet Take 1 tablet (125 mcg total) by mouth daily. 90 tablet 1  . loratadine (CLARITIN) 10 MG tablet Take 10 mg by mouth daily.    Marland Kitchen losartan (COZAAR) 100 MG tablet Take 1 tablet (100 mg total) by mouth daily. 90 tablet 1  . Omega-3 Fatty Acids (FISH OIL) 1000 MG CAPS Take 1 capsule by mouth 2 (two) times daily.    . predniSONE (DELTASONE) 20 MG tablet 2 tabs po qd x 7d, then 1 tab po qd x 7d, then 1/2 tab po qd x 8d, then stop 25 tablet 0   No facility-administered medications prior to visit.    PE: Blood pressure 163/83, pulse 75, temperature 99.2 F (37.3 C), temperature source Temporal, resp. rate 18, height 4' 11.5" (1.511 m), weight 148 lb (67.132 kg), SpO2 96 %. Gen: Alert, well appearing.  Patient is oriented to person, place,  time, and situation. Right hand middle finger DIP joint lateral aspect with swelling and violaceous hue/erythema, +mod tenderness, some fluctuance to suggest possible fluid loculation in subQ tissue.  Nail is normal.  Finger ROM normal.  IMPRESSION AND PLAN:  Right hand middle finger DIP focal skin infection at the site of a healed (tiny) laceration. I used #10 scalpel to cut a 3 mm line in the area of fluctuance, about 2 mm deep and got return of only bloody fluid and the swelling diminished minimally.  I think the swelling is likely tissue edema and not loculated pus. Will continue with bid to tid epsom salt soaks, start augmentin 875mg  bid x 10d and start bactroban ointment tid to area of incision today, cover when not soaking. Signs/symptoms to call or return for were reviewed and pt expressed understanding.  An After Visit Summary was printed and given to the patient.  FOLLOW UP: has appt scheduled for next week

## 2014-02-26 ENCOUNTER — Encounter: Payer: Self-pay | Admitting: Family Medicine

## 2014-02-26 ENCOUNTER — Telehealth: Payer: Self-pay | Admitting: Family Medicine

## 2014-02-26 ENCOUNTER — Ambulatory Visit (INDEPENDENT_AMBULATORY_CARE_PROVIDER_SITE_OTHER): Payer: Medicare Other | Admitting: Family Medicine

## 2014-02-26 ENCOUNTER — Ambulatory Visit: Payer: Self-pay | Admitting: Family Medicine

## 2014-02-26 VITALS — BP 181/100 | HR 104 | Temp 99.5°F | Resp 18 | Ht 59.5 in | Wt 148.0 lb

## 2014-02-26 DIAGNOSIS — IMO0001 Reserved for inherently not codable concepts without codable children: Secondary | ICD-10-CM

## 2014-02-26 DIAGNOSIS — L02511 Cutaneous abscess of right hand: Secondary | ICD-10-CM

## 2014-02-26 MED ORDER — HYDROCODONE-ACETAMINOPHEN 5-325 MG PO TABS: 1.0000 | ORAL_TABLET | Freq: Four times a day (QID) | ORAL | Status: DC | PRN

## 2014-02-26 MED ORDER — DOXYCYCLINE HYCLATE 100 MG PO TABS
100.0000 mg | ORAL_TABLET | Freq: Two times a day (BID) | ORAL | Status: DC
Start: 2014-02-26 — End: 2014-04-14

## 2014-02-26 NOTE — Progress Notes (Addendum)
OFFICE NOTE  02/26/2014  CC:  Chief Complaint  Patient presents with  . Follow-up     HPI: Patient is a 68 y.o. [name] who is here for ongoing right hand middle finger pain, feels like things are worse since being here yesterday and having it incised and getting started on antibiotics. Finger more diffusely swollen, area where swelling was yesterday is a little MORE swollen in pt's opinion, a little more extension of the erythema around the other side of her nail.  She has soaked it and taken her augmentin as rx'd.  No fevers.  Pertinent PMH:  Past medical, surgical, social, and family history reviewed and no changes are noted since last office visit.  MEDS:  Outpatient Prescriptions Prior to Visit  Medication Sig Dispense Refill  . amoxicillin-clavulanate (AUGMENTIN) 875-125 MG per tablet Take 1 tablet by mouth 2 (two) times daily. 20 tablet 0  . Calcium Carbonate-Vitamin D (CALCIUM 600+D) 600-400 MG-UNIT per tablet Take 1 tablet by mouth 2 (two) times daily.    . Ciclopirox 0.77 % gel Apply to affected areas bid prn 30 g 6  . fexofenadine (ALLEGRA) 180 MG tablet Take 1 tablet (180 mg total) by mouth daily. 30 tablet 6  . levothyroxine (SYNTHROID, LEVOTHROID) 125 MCG tablet Take 1 tablet (125 mcg total) by mouth daily. 90 tablet 1  . loratadine (CLARITIN) 10 MG tablet Take 10 mg by mouth daily.    Marland Kitchen losartan (COZAAR) 100 MG tablet Take 1 tablet (100 mg total) by mouth daily. 90 tablet 1  . mupirocin ointment (BACTROBAN) 2 % Apply 1 application topically 3 (three) times daily. 15 g 1  . Omega-3 Fatty Acids (FISH OIL) 1000 MG CAPS Take 1 capsule by mouth 2 (two) times daily.     No facility-administered medications prior to visit.    PE: Blood pressure 181/100, pulse 104, temperature 99.5 F (37.5 C), temperature source Temporal, resp. rate 18, height 4' 11.5" (1.511 m), weight 148 lb (67.132 kg), SpO2 97 %. Gen: Alert, well appearing.  Patient is oriented to person,  place, time, and situation. Right hand middle finger with subtle diffuse swelling, distal phalanx with swelling proximal to the nail, violaceous hue with some fluctuance of skin in the center of the swelling.  Mild pinkish hue to the skin in the surrounding skin--extending around the nail in both directions but not back towards the hand.  Nail appears normal.  She has trouble completely closing her hand in a fist today.  Distal 3rd finger of right hand is moderately tender to touch, mildly warm.  IMPRESSION AND PLAN:  Right hand third finger (distal phalanx abscess): I&D today, wound culture sent today, continue augmentin to cover MSSA but add doxycycline 100mg  bid x 10d to cover MRSA. Continue soaks and bactroban ointment. Vicodin 5/325, 1-2 q6h prn, #30.    PROCEDURE: sterile technique used to incise the area of swelling on distal phalanx after doing medial and lateral digital block on right hand middle finger just distal to the PIP joint (4 cc of 2% plain lidocaine total).  Small amount of white mucousy substance came out with blood and this was swabbed and sent for c/s.  Area was probed with curved hemostats but no further pockets of pus could be found.  Pt tolerated procedure well, no immediate complications.  Home care discussed.  An After Visit Summary was printed and given to the patient.  FOLLOW UP: 2 days

## 2014-02-26 NOTE — Patient Instructions (Signed)
Buy an OTC probiotic (Gastrointestinal probiotic, any brand) and take one daily for the next 1 month.

## 2014-02-26 NOTE — Progress Notes (Signed)
Pre visit review using our clinic review tool, if applicable. No additional management support is needed unless otherwise documented below in the visit note. 

## 2014-02-26 NOTE — Telephone Encounter (Signed)
Patient Information:  Caller Name: Mackenzie Wood  Phone: [phone]  Patient: Mackenzie Wood, Mackenzie Wood  Gender: Female  DOB: 06/18/1945  Age: 68 Years  PCP: Mackenzie Wood [facility])  Office Follow Up:  Does the office need to follow up with this patient?: No  Instructions For The Office: N/A   Symptoms  Reason For Call & Symptoms: Pt ws seen yesterday for an infected finger. She is very worried about it /it appears worse today. Discoloured and swollen. Pt was started on abx yesterday but she wants Dr. Anitra Lauth to reassess it again.  Reviewed Health History In EMR: Yes  Reviewed Medications In EMR: Yes  Reviewed Allergies In EMR: Yes  Reviewed Surgeries / Procedures: Yes  Date of Onset of Symptoms: 02/19/2014  Guideline(s) Used:  Finger Injury  Wound Infection  Disposition Per Guideline:   Go to Office Now  Reason For Disposition Reached:   Finger wound and entire finger swollen  Advice Given:  N/A  Patient Will Follow Care Advice:  YES  Appointment Scheduled:  02/26/2014 13:15:00 Appointment Scheduled Provider:  Ricardo Wood (Family Practice)

## 2014-02-28 ENCOUNTER — Encounter: Payer: Self-pay | Admitting: Family Medicine

## 2014-02-28 ENCOUNTER — Ambulatory Visit (INDEPENDENT_AMBULATORY_CARE_PROVIDER_SITE_OTHER): Payer: Medicare Other | Admitting: Family Medicine

## 2014-02-28 VITALS — BP 172/79 | HR 73 | Temp 98.0°F | Resp 18 | Ht 59.5 in | Wt 149.0 lb

## 2014-02-28 DIAGNOSIS — L089 Local infection of the skin and subcutaneous tissue, unspecified: Secondary | ICD-10-CM | POA: Insufficient documentation

## 2014-02-28 NOTE — Progress Notes (Signed)
OFFICE NOTE  02/28/2014  CC:  Chief Complaint  Patient presents with  . Follow-up    finger     HPI: Patient is a 68 y.o. [name] who is here for 2 d f/u for finger infection, s/p I and D here 2 d/a.  Feeling much better: no pain, no swelling or drainage, no fever. No new area of redness.  She is taking abx and applying bactroban and soaking as directed.   Pertinent PMH:  Past medical, surgical, social, and family history reviewed and no changes are noted since last office visit.  MEDS:  Outpatient Prescriptions Prior to Visit  Medication Sig Dispense Refill  . amoxicillin-clavulanate (AUGMENTIN) 875-125 MG per tablet Take 1 tablet by mouth 2 (two) times daily. 20 tablet 0  . Calcium Carbonate-Vitamin D (CALCIUM 600+D) 600-400 MG-UNIT per tablet Take 1 tablet by mouth 2 (two) times daily.    . Ciclopirox 0.77 % gel Apply to affected areas bid prn 30 g 6  . doxycycline (VIBRA-TABS) 100 MG tablet Take 1 tablet (100 mg total) by mouth 2 (two) times daily. 20 tablet 0  . fexofenadine (ALLEGRA) 180 MG tablet Take 1 tablet (180 mg total) by mouth daily. 30 tablet 6  . HYDROcodone-acetaminophen (NORCO/VICODIN) 5-325 MG per tablet Take 1-2 tablets by mouth every 6 (six) hours as needed for moderate pain. 30 tablet 0  . levothyroxine (SYNTHROID, LEVOTHROID) 125 MCG tablet Take 1 tablet (125 mcg total) by mouth daily. 90 tablet 1  . loratadine (CLARITIN) 10 MG tablet Take 10 mg by mouth daily.    Marland Kitchen losartan (COZAAR) 100 MG tablet Take 1 tablet (100 mg total) by mouth daily. 90 tablet 1  . mupirocin ointment (BACTROBAN) 2 % Apply 1 application topically 3 (three) times daily. 15 g 1  . Omega-3 Fatty Acids (FISH OIL) 1000 MG CAPS Take 1 capsule by mouth 2 (two) times daily.     No facility-administered medications prior to visit.    PE: Blood pressure 172/79, pulse 73, temperature 98 F (36.7 C), temperature source Oral, resp. rate 18, height 4' 11.5" (1.511 m), weight 149 lb  (67.586 kg), SpO2 98 %. Right hand middle finger distal phalanx with erythema and bruising but no swelling or tenderness to palpation.  No streaking.  Nail is normal.  No wound drainage.  She can't make a completely closed fist.  IMPRESSION AND PLAN:  Finger infection, improved s/p I and D and on abx to cover MSSA and MRSA, wound clx results pending. Continue all current therapies.  FOLLOW UP: she has appt already in 3d for routine f/u of chronic med probs

## 2014-02-28 NOTE — Progress Notes (Signed)
Pre visit review using our clinic review tool, if applicable. No additional management support is needed unless otherwise documented below in the visit note. 

## 2014-03-01 LAB — WOUND CULTURE
Gram Stain: NONE SEEN
Gram Stain: NONE SEEN

## 2014-03-03 ENCOUNTER — Ambulatory Visit (INDEPENDENT_AMBULATORY_CARE_PROVIDER_SITE_OTHER): Payer: Medicare Other | Admitting: Family Medicine

## 2014-03-03 ENCOUNTER — Encounter: Payer: Self-pay | Admitting: Family Medicine

## 2014-03-03 VITALS — BP 167/78 | HR 69 | Temp 97.8°F | Resp 16 | Ht 59.5 in | Wt 147.0 lb

## 2014-03-03 DIAGNOSIS — E039 Hypothyroidism, unspecified: Secondary | ICD-10-CM | POA: Diagnosis not present

## 2014-03-03 DIAGNOSIS — E785 Hyperlipidemia, unspecified: Secondary | ICD-10-CM | POA: Diagnosis not present

## 2014-03-03 DIAGNOSIS — N3941 Urge incontinence: Secondary | ICD-10-CM

## 2014-03-03 DIAGNOSIS — E119 Type 2 diabetes mellitus without complications: Secondary | ICD-10-CM | POA: Diagnosis not present

## 2014-03-03 DIAGNOSIS — I10 Essential (primary) hypertension: Secondary | ICD-10-CM

## 2014-03-03 DIAGNOSIS — Z23 Encounter for immunization: Secondary | ICD-10-CM

## 2014-03-03 DIAGNOSIS — L089 Local infection of the skin and subcutaneous tissue, unspecified: Secondary | ICD-10-CM

## 2014-03-03 LAB — COMPREHENSIVE METABOLIC PANEL
ALT: 24 U/L (ref 0–35)
AST: 24 U/L (ref 0–37)
Albumin: 4.3 g/dL (ref 3.5–5.2)
Alkaline Phosphatase: 71 U/L (ref 39–117)
BILIRUBIN TOTAL: 0.6 mg/dL (ref 0.2–1.2)
BUN: 15 mg/dL (ref 6–23)
CALCIUM: 9.4 mg/dL (ref 8.4–10.5)
CO2: 27 meq/L (ref 19–32)
CREATININE: 0.6 mg/dL (ref 0.4–1.2)
Chloride: 102 mEq/L (ref 96–112)
GFR: 99.11 mL/min (ref >60.00)
Glucose, Bld: 114 mg/dL — ABNORMAL HIGH (ref 70–99)
Potassium: 4.5 mEq/L (ref 3.5–5.1)
Sodium: 138 mEq/L (ref 135–145)
Total Protein: 6.8 g/dL (ref 6.0–8.3)

## 2014-03-03 LAB — HEMOGLOBIN A1C: Hgb A1c MFr Bld: 7.6 % — ABNORMAL HIGH (ref 4.6–6.5)

## 2014-03-03 LAB — LIPID PANEL
CHOL/HDL RATIO: 5
Cholesterol: 199 mg/dL (ref 0–200)
HDL: 41.6 mg/dL (ref >39.00)
LDL Cholesterol: 126 mg/dL — ABNORMAL HIGH (ref 0–99)
NonHDL: 157.4
Triglycerides: 157 mg/dL — ABNORMAL HIGH (ref 0.0–149.0)
VLDL: 31.4 mg/dL (ref 0.0–40.0)

## 2014-03-03 LAB — TSH: TSH: 3.31 u[IU]/mL (ref 0.35–4.50)

## 2014-03-03 MED ORDER — ASPIRIN EC 81 MG PO TBEC: 81.0000 mg | DELAYED_RELEASE_TABLET | Freq: Every day | ORAL | Status: DC

## 2014-03-03 NOTE — Progress Notes (Signed)
OFFICE NOTE  03/03/2014  CC:  Chief Complaint  Patient presents with  . Follow-up    fasting.    HPI: Patient is a 68 y.o. [name] who is here for 6 mo follow up DM 2, HTN, hypothyroidism. Of note, her finger infection continues to improve significantly s/p I&D.  Culture showed MSSA and she is currently still on augmentin and doxycycline. Pt says she has not been checking bp's very often, but they were normal up until the last 1 mo or so--she was on prednisone during at least part of the time that the bp's were noted to be higher.  Also under more stress due to having her father in law living with her.   Fasting glucoses avg 120.  No PP glucose monitoring.    She is puzzled by intermittent periods of urinary urgency and urge incontinence, also some separate periods of incontinence of bowels (but this occurs much less often).  These periods last only a few days to a week or so at the most, are not accompanied by any pain or fever or hematuria or LE weakness or saddle anesthesia.  The episodes spontaneously resolve and she then has completely normal bladder and bowel function.    Pertinent PMH:  Past medical, surgical, social, and family history reviewed and no changes are noted since last office visit.  MEDS:  Outpatient Prescriptions Prior to Visit  Medication Sig Dispense Refill  . Calcium Carbonate-Vitamin D (CALCIUM 600+D) 600-400 MG-UNIT per tablet Take 1 tablet by mouth 2 (two) times daily.    . Ciclopirox 0.77 % gel Apply to affected areas bid prn 30 g 6  . doxycycline (VIBRA-TABS) 100 MG tablet Take 1 tablet (100 mg total) by mouth 2 (two) times daily. 20 tablet 0  . fexofenadine (ALLEGRA) 180 MG tablet Take 1 tablet (180 mg total) by mouth daily. 30 tablet 6  . HYDROcodone-acetaminophen (NORCO/VICODIN) 5-325 MG per tablet Take 1-2 tablets by mouth every 6 (six) hours as needed for moderate pain. 30 tablet 0  . levothyroxine (SYNTHROID, LEVOTHROID) 125 MCG tablet Take 1  tablet (125 mcg total) by mouth daily. 90 tablet 1  . loratadine (CLARITIN) 10 MG tablet Take 10 mg by mouth daily.    Marland Kitchen losartan (COZAAR) 100 MG tablet Take 1 tablet (100 mg total) by mouth daily. 90 tablet 1  . mupirocin ointment (BACTROBAN) 2 % Apply 1 application topically 3 (three) times daily. 15 g 1  . Omega-3 Fatty Acids (FISH OIL) 1000 MG CAPS Take 1 capsule by mouth 2 (two) times daily.    Marland Kitchen amoxicillin-clavulanate (AUGMENTIN) 875-125 MG per tablet Take 1 tablet by mouth 2 (two) times daily. 20 tablet 0   No facility-administered medications prior to visit.    PE: Blood pressure 167/78, pulse 69, temperature 97.8 F (36.6 C), temperature source Temporal, resp. rate 16, height 4' 11.5" (1.511 m), weight 147 lb (66.679 kg), SpO2 100 %. Gen: Alert, well appearing.  Patient is oriented to person, place, time, and situation. AFFECT: pleasant, lucid thought and speech. Right hand middle finger distal phalanx with only mild pinkish hue and some flakiness.  Nontender, no streaking or swelling.  LABS: none today RECENT: Lab Results  Component Value Date   HGBA1C 6.9* 09/02/2013   Lab Results  Component Value Date   TSH 0.69 03/04/2013     Chemistry      Component Value Date/Time   NA 139 09/02/2013 0906   K 4.5 09/02/2013 0906   CL  104 09/02/2013 0906   CO2 30 09/02/2013 0906   BUN 15 09/02/2013 0906   CREATININE 0.6 09/02/2013 0906      Component Value Date/Time   CALCIUM 9.4 09/02/2013 0906   ALKPHOS 73 09/02/2013 0906   AST 16 09/02/2013 0906   ALT 21 09/02/2013 0906   BILITOT 0.7 09/02/2013 0906     Lab Results  Component Value Date   CHOL 199 09/02/2013   HDL 41.00 09/02/2013   LDLCALC 121* 09/02/2013   LDLDIRECT 176.6 03/04/2013   TRIG 185.0* 09/02/2013   CHOLHDL 5 09/02/2013     IMPRESSION AND PLAN:  1) DM 2, hx of good control on diet only.   Has eye exam scheduled for 03/25/14.  Flu vaccine IM today.   HbA1c today. I recommended she restart ASA  81mg  qd today.  Continue glucose monitoring qAM fasting plus add some postprandial checks 3-4 times per week.  2) HTN; control not ideal but I feel like we need more home bp checks before making any med changes.  She'll check bp 1-2 times per day for the next 2 wks and call and let me know these numbers. CMET today.  3) Hyperlipidemia: hx of refusal of meds in the past.  We talked of the benefits of statins in people with diabetes, even if cholesterol normal. Recheck FLP today.  4) Hypothyroidism: due for TSH today.  5) Finger infection; resolving.  Stop augmentin.  Finish course of doxycycline.  6) Episodes of urge incontinence (and rare episodes of fecal incontinence): I recommended pt come in to give sample of urine when she is in the midst of one of these episodes in the future.  An After Visit Summary was printed and given to the patient.  FOLLOW UP: 6 mo

## 2014-03-03 NOTE — Progress Notes (Signed)
Pre visit review using our clinic review tool, if applicable. No additional management support is needed unless otherwise documented below in the visit note. 

## 2014-03-07 MED ORDER — METFORMIN HCL 500 MG PO TABS
ORAL_TABLET | ORAL | Status: DC
Start: 2014-03-07 — End: 2014-06-30

## 2014-03-18 ENCOUNTER — Telehealth: Payer: Self-pay | Admitting: Family Medicine

## 2014-03-18 NOTE — Telephone Encounter (Signed)
Patient needs new Rx for TrueTest glucose test strips with DX code.  Please write Rx so I can fax it.  I can't seem order it on EPIC.

## 2014-03-19 NOTE — Telephone Encounter (Signed)
Rx faxed to pharmacy  

## 2014-03-19 NOTE — Telephone Encounter (Signed)
Rx written.

## 2014-03-24 ENCOUNTER — Other Ambulatory Visit: Payer: Self-pay

## 2014-03-25 DIAGNOSIS — D3132 Benign neoplasm of left choroid: Secondary | ICD-10-CM | POA: Diagnosis not present

## 2014-04-14 ENCOUNTER — Ambulatory Visit (INDEPENDENT_AMBULATORY_CARE_PROVIDER_SITE_OTHER): Payer: Medicare Other | Admitting: Family Medicine

## 2014-04-14 ENCOUNTER — Encounter: Payer: Self-pay | Admitting: Family Medicine

## 2014-04-14 VITALS — BP 146/87 | HR 73 | Temp 98.6°F | Resp 18 | Ht 59.5 in | Wt 143.0 lb

## 2014-04-14 DIAGNOSIS — E782 Mixed hyperlipidemia: Secondary | ICD-10-CM | POA: Diagnosis not present

## 2014-04-14 DIAGNOSIS — I10 Essential (primary) hypertension: Secondary | ICD-10-CM | POA: Diagnosis not present

## 2014-04-14 DIAGNOSIS — E119 Type 2 diabetes mellitus without complications: Secondary | ICD-10-CM | POA: Diagnosis not present

## 2014-04-14 MED ORDER — LOSARTAN POTASSIUM-HCTZ 100-25 MG PO TABS: 1.0000 | ORAL_TABLET | Freq: Every day | ORAL | Status: DC

## 2014-04-14 NOTE — Patient Instructions (Signed)
Bp goal range:  Top number 110-140, Bottom number 65-90.

## 2014-04-14 NOTE — Progress Notes (Signed)
Pre visit review using our clinic review tool, if applicable. No additional management support is needed unless otherwise documented below in the visit note. 

## 2014-04-14 NOTE — Progress Notes (Signed)
OFFICE NOTE  04/14/2014  CC:  Chief Complaint  Patient presents with  . Medication Management   HPI: Patient is a 69 y.o. [name] who is here for 6 week f/u DM 2, HTN, hyperlipidemia. Last visit we started her on metformin 500 mg bid b/c A1c had gone up to 7.6%. Also needs to start statin but we waited to start this med--to discuss it today.  Says she is feeling fine.  Home bp s reviewed today: avg systolic about 417, avg diast 70s-80.  No HR numbers. Did not start metformin yet b/c she had some questions about it.    Reviewed labs from last visit today in detail.   Pertinent PMH:  Past medical, surgical, social, and family history reviewed and no changes are noted since last office visit.  MEDS:  Outpatient Prescriptions Prior to Visit  Medication Sig Dispense Refill  . aspirin EC 81 MG tablet Take 1 tablet (81 mg total) by mouth daily. 30 tablet 0  . Calcium Carbonate-Vitamin D (CALCIUM 600+D) 600-400 MG-UNIT per tablet Take 1 tablet by mouth 2 (two) times daily.    . Ciclopirox 0.77 % gel Apply to affected areas bid prn 30 g 6  . fexofenadine (ALLEGRA) 180 MG tablet Take 1 tablet (180 mg total) by mouth daily. 30 tablet 6  . levothyroxine (SYNTHROID, LEVOTHROID) 125 MCG tablet Take 1 tablet (125 mcg total) by mouth daily. 90 tablet 1  . losartan (COZAAR) 100 MG tablet Take 1 tablet (100 mg total) by mouth daily. 90 tablet 1  . metFORMIN (GLUCOPHAGE) 500 MG tablet Take one tablet at dinner for one week. Then, take 1 tablet at breakfast and one tablet at dinner. 270 tablet 0  . Omega-3 Fatty Acids (FISH OIL) 1000 MG CAPS Take 1 capsule by mouth 2 (two) times daily.    Marland Kitchen doxycycline (VIBRA-TABS) 100 MG tablet Take 1 tablet (100 mg total) by mouth 2 (two) times daily. 20 tablet 0  . HYDROcodone-acetaminophen (NORCO/VICODIN) 5-325 MG per tablet Take 1-2 tablets by mouth every 6 (six) hours as needed for moderate pain. 30 tablet 0  . loratadine (CLARITIN) 10 MG tablet Take 10  mg by mouth daily.    . mupirocin ointment (BACTROBAN) 2 % Apply 1 application topically 3 (three) times daily. 15 g 1   No facility-administered medications prior to visit.    PE: Blood pressure 146/87, pulse 73, temperature 98.6 F (37 C), temperature source Temporal, resp. rate 18, height 4' 11.5" (1.511 m), weight 143 lb (64.864 kg), SpO2 96 %. Gen: Alert, well appearing.  Patient is oriented to person, place, time, and situation. AFFECT: pleasant, lucid thought and speech. No further exam today.  LAB: none today RECENT: Lab Results  Component Value Date   HGBA1C 7.6* 03/03/2014     Chemistry      Component Value Date/Time   NA 138 03/03/2014 1016   K 4.5 03/03/2014 1016   CL 102 03/03/2014 1016   CO2 27 03/03/2014 1016   BUN 15 03/03/2014 1016   CREATININE 0.6 03/03/2014 1016      Component Value Date/Time   CALCIUM 9.4 03/03/2014 1016   ALKPHOS 71 03/03/2014 1016   AST 24 03/03/2014 1016   ALT 24 03/03/2014 1016   BILITOT 0.6 03/03/2014 1016     Lab Results  Component Value Date   CHOL 199 03/03/2014   HDL 41.60 03/03/2014   LDLCALC 126* 03/03/2014   LDLDIRECT 176.6 03/04/2013   TRIG 157.0* 03/03/2014  CHOLHDL 5 03/03/2014   IMPRESSION AND PLAN:  1) DM 2, needs to get started on metformin 500 mg bid as previously prescribed. Answered pt's questions about this med today.  2) HTN: not ideal control.  Change to losartan/hctz 100/25, 1 qd.  Check BMET in 7-10d. Monitor bp at home like she has been doing.  Goal range discussed: 628-366 systolic over 29-47 diastolic.  3) Hyperlipidemia: pt not ready to start statin yet from a mental standpoint.  She has to take this one med at a time. We'll broach this topic again at next f/u in 4 mo.  Spent 25 min with pt today, with >50% of this time spent in counseling and care coordination regarding the above problems.  An After Visit Summary was printed and given to the patient.  FOLLOW UP: 40mo

## 2014-05-13 ENCOUNTER — Other Ambulatory Visit (INDEPENDENT_AMBULATORY_CARE_PROVIDER_SITE_OTHER): Payer: Medicare Other

## 2014-05-13 DIAGNOSIS — I10 Essential (primary) hypertension: Secondary | ICD-10-CM | POA: Diagnosis not present

## 2014-05-13 LAB — BASIC METABOLIC PANEL
BUN: 21 mg/dL (ref 6–23)
CALCIUM: 9.7 mg/dL (ref 8.4–10.5)
CO2: 32 mEq/L (ref 19–32)
CREATININE: 0.68 mg/dL (ref 0.40–1.20)
Chloride: 100 mEq/L (ref 96–112)
GFR: 90.7 mL/min (ref >60.00)
GLUCOSE: 158 mg/dL — AB (ref 70–99)
Potassium: 4.1 mEq/L (ref 3.5–5.1)
Sodium: 138 mEq/L (ref 135–145)

## 2014-05-19 ENCOUNTER — Telehealth: Payer: Self-pay | Admitting: Family Medicine

## 2014-05-19 MED ORDER — LOSARTAN POTASSIUM-HCTZ 100-25 MG PO TABS: 1.0000 | ORAL_TABLET | Freq: Every day | ORAL | Status: DC

## 2014-05-19 NOTE — Telephone Encounter (Signed)
Rx sent 

## 2014-05-19 NOTE — Telephone Encounter (Signed)
Any adjustments needed to BP med before I send it in?

## 2014-05-19 NOTE — Telephone Encounter (Signed)
No adjustments.  Go ahead and send in rx's as per pt's request.-thx

## 2014-05-19 NOTE — Telephone Encounter (Signed)
Pt. Came in to ask for a refilll on her B/P meds. She said if you can call in a small amount to [facility] and a then a RX for a  90 day supply to [place] will cover both. She is out and needs to know if there are any adjustments that are needed?

## 2014-05-20 ENCOUNTER — Other Ambulatory Visit: Payer: Self-pay

## 2014-05-20 DIAGNOSIS — Z1231 Encounter for screening mammogram for malignant neoplasm of breast: Secondary | ICD-10-CM

## 2014-06-02 ENCOUNTER — Ambulatory Visit
Admission: RE | Admit: 2014-06-02 | Discharge: 2014-06-02 | Disposition: A | Discharge status: Home / Self Care | Payer: Medicare Other | Source: Ambulatory Visit

## 2014-06-02 DIAGNOSIS — Z1231 Encounter for screening mammogram for malignant neoplasm of breast: Secondary | ICD-10-CM

## 2014-06-17 ENCOUNTER — Other Ambulatory Visit: Payer: Self-pay | Admitting: *Deleted

## 2014-06-17 NOTE — Telephone Encounter (Signed)
Please advise refill for test strips? Rx never filled by provider before.

## 2014-06-19 MED ORDER — GLUCOSE BLOOD VI STRP: 1.0000 | ORAL_STRIP | Freq: Every day | Status: DC

## 2014-06-21 ENCOUNTER — Emergency Department (HOSPITAL_BASED_OUTPATIENT_CLINIC_OR_DEPARTMENT_OTHER)
Admission: EM | Admit: 2014-06-21 | Discharge: 2014-06-21 | Disposition: A | Discharge status: Home / Self Care | Payer: Medicare Other | Attending: Emergency Medicine | Admitting: Emergency Medicine

## 2014-06-21 ENCOUNTER — Encounter (HOSPITAL_BASED_OUTPATIENT_CLINIC_OR_DEPARTMENT_OTHER): Payer: Self-pay | Admitting: *Deleted

## 2014-06-21 DIAGNOSIS — L259 Unspecified contact dermatitis, unspecified cause: Secondary | ICD-10-CM | POA: Diagnosis not present

## 2014-06-21 DIAGNOSIS — Z8739 Personal history of other diseases of the musculoskeletal system and connective tissue: Secondary | ICD-10-CM | POA: Insufficient documentation

## 2014-06-21 DIAGNOSIS — E119 Type 2 diabetes mellitus without complications: Secondary | ICD-10-CM | POA: Insufficient documentation

## 2014-06-21 DIAGNOSIS — Z8669 Personal history of other diseases of the nervous system and sense organs: Secondary | ICD-10-CM | POA: Insufficient documentation

## 2014-06-21 DIAGNOSIS — Z79899 Other long term (current) drug therapy: Secondary | ICD-10-CM | POA: Diagnosis not present

## 2014-06-21 DIAGNOSIS — Z8639 Personal history of other endocrine, nutritional and metabolic disease: Secondary | ICD-10-CM | POA: Insufficient documentation

## 2014-06-21 DIAGNOSIS — I10 Essential (primary) hypertension: Secondary | ICD-10-CM | POA: Insufficient documentation

## 2014-06-21 DIAGNOSIS — Z862 Personal history of diseases of the blood and blood-forming organs and certain disorders involving the immune mechanism: Secondary | ICD-10-CM | POA: Diagnosis not present

## 2014-06-21 DIAGNOSIS — Z8701 Personal history of pneumonia (recurrent): Secondary | ICD-10-CM | POA: Diagnosis not present

## 2014-06-21 DIAGNOSIS — Z7982 Long term (current) use of aspirin: Secondary | ICD-10-CM | POA: Insufficient documentation

## 2014-06-21 DIAGNOSIS — R21 Rash and other nonspecific skin eruption: Secondary | ICD-10-CM | POA: Diagnosis present

## 2014-06-21 MED ORDER — PREDNISONE 20 MG PO TABS: ORAL_TABLET | ORAL | Status: DC

## 2014-06-21 NOTE — ED Provider Notes (Signed)
CSN: [id number]     Arrival date & time 06/21/14  6203 History   None    Chief Complaint  Patient presents with  . Rash     (Consider location/radiation/quality/duration/timing/severity/associated sxs/prior Treatment) HPI Comments: 69 year old female with history of diabetes, high blood pressure, contact dermatitis, lipids resents with worsening rash since Thursday when she was working in the garden with multiple different exposures. Patient has had this rash in the past but this is more significant. Patient said to be on steroids for poison ivy-like rash in the past as well. No breathing difficulties or vomiting or diarrhea. No swelling sensation in her throat. Nothing is improved rash is gradually worsened upper arms bilateral  Patient is a 69 y.o. female presenting with rash. The history is provided by the patient.  Rash Associated symptoms: no abdominal pain, no fever, no headaches, no shortness of breath and not vomiting     Past Medical History  Diagnosis Date  . HTN (hypertension)   . Hypothyroidism   . Depression   . Congenital sensorineural hearing loss     deaf in right ear; mild presbycusis in left ear (audiologist 2014)  . CAP (community acquired pneumonia) 02/2010  . Beta thalassemia, heterozygous   . Somnolence, daytime, controlled 04/22/2012  . Hyperlipidemia, mixed     Refuses meds  . Type II or unspecified type diabetes mellitus without mention of complication, uncontrolled     08/2012 pt refused to start meds despite rising HbA1c on diet only  . Osteopenia     DEXAs 2003-2013--pt has declined bisphosphonates  . Vitamin D deficiency    Past Surgical History  Procedure Laterality Date  . Tonsillectomy and adenoidectomy      as a child  . Colonoscopy  2008    Normal.  10 yr recall.  . Breast biopsy      Left breast biopsy x 2 (fibrocystic/benign)   Family History  Problem Relation Age of Onset  . Hypertension Mother   . Hypertension Father    History   Substance Use Topics  . Smoking status: Never Smoker   . Smokeless tobacco: Never Used  . Alcohol Use: No   OB History    No data available     Review of Systems  Constitutional: Negative for fever and chills.  HENT: Negative for congestion.   Eyes: Negative for visual disturbance.  Respiratory: Negative for shortness of breath.   Cardiovascular: Negative for chest pain.  Gastrointestinal: Negative for vomiting and abdominal pain.  Genitourinary: Negative for dysuria and flank pain.  Musculoskeletal: Negative for back pain, neck pain and neck stiffness.  Skin: Positive for rash.  Neurological: Negative for light-headedness and headaches.      Allergies  Review of patient's allergies indicates no known allergies.  Home Medications   Prior to Admission medications   Medication Sig Start Date End Date Taking? Authorizing Provider  aspirin EC 81 MG tablet Take 1 tablet (81 mg total) by mouth daily. Patient taking differently: Take 81 mg by mouth every other day.  03/03/14   Tammi Sou, MD  Calcium Carbonate-Vitamin D (CALCIUM 600+D) 600-400 MG-UNIT per tablet Take 1 tablet by mouth 2 (two) times daily.    Historical Provider, MD  Ciclopirox 0.77 % gel Apply to affected areas bid prn 12/26/13   Tammi Sou, MD  fexofenadine (ALLEGRA) 180 MG tablet Take 1 tablet (180 mg total) by mouth daily. 01/24/14   Tammi Sou, MD  glucose blood test strip 1  each by Other route daily. Use as instructed 06/19/14   Tammi Sou, MD  losartan-hydrochlorothiazide (HYZAAR) 100-25 MG per tablet Take 1 tablet by mouth daily. 05/19/14   Tammi Sou, MD  metFORMIN (GLUCOPHAGE) 500 MG tablet Take one tablet at dinner for one week. Then, take 1 tablet at breakfast and one tablet at dinner. 03/07/14   Tammi Sou, MD  Omega-3 Fatty Acids (FISH OIL) 1000 MG CAPS Take 1 capsule by mouth 2 (two) times daily.    Historical Provider, MD  predniSONE (DELTASONE) 20 MG tablet 3 tabs po  daily x 3 days, then 2 tabs x 3 days, then 1.5 tabs x 3 days, then 1 tab x 3 days, then 0.5 tabs x 3 days 06/21/14   Elnora Morrison, MD   BP 164/62 mmHg  Pulse 81  Temp(Src) 98 F (36.7 C) (Oral)  Resp 18  Ht 5' (1.524 m)  Wt 139 lb (63.05 kg)  BMI 27.15 kg/m2  SpO2 100% Physical Exam  Constitutional: She is oriented to person, place, and time. She appears well-developed and well-nourished.  HENT:  Head: Normocephalic and atraumatic.  No angioedema mild swelling left periorbital region no erythema  Eyes: Conjunctivae are normal. Right eye exhibits no discharge. Left eye exhibits no discharge.  Neck: Normal range of motion. Neck supple. No tracheal deviation present.  Cardiovascular: Normal rate and regular rhythm.   Pulmonary/Chest: Effort normal and breath sounds normal.  Abdominal: Soft. She exhibits no distension. There is no tenderness. There is no guarding.  Musculoskeletal: She exhibits edema.  Neurological: She is alert and oriented to person, place, and time.  Skin: Skin is warm. Rash noted.  Patient has multiple papules and fluid vesicles some in linear fashion on the arms primarily up to proximal humerus bilateral and small area on the leg. Mild erythema up her arm.  Psychiatric: She has a normal mood and affect.  Nursing note and vitals reviewed.   ED Course  Procedures (including critical care time) Labs Review Labs Reviewed - No data to display  Imaging Review No results found.   EKG Interpretation None      MDM   Final diagnoses:  Contact dermatitis   Concern for contact them otitis, no systemic symptoms otherwise. No angioedema and discussed close outpatient follow. Will send patient home with steroid taper. No oral lesions, no lesions on hands or feet. Patient understands this will increase her glucose in for close follow-up with primary doctor.  Results and differential diagnosis were discussed with the patient/parent/guardian. Close follow up  outpatient was discussed, comfortable with the plan.   Medications - No data to display  Filed Vitals:   06/21/14 0807  BP: 164/62  Pulse: 81  Temp: 98 F (36.7 C)  TempSrc: Oral  Resp: 18  Height: 5' (1.524 m)  Weight: 139 lb (63.05 kg)  SpO2: 100%    Final diagnoses:  Contact dermatitis       Elnora Morrison, MD 06/21/14 [phone]

## 2014-06-21 NOTE — Discharge Instructions (Signed)
Use Benadryl as needed for significant itching. Return for breathing difficulties or tongue or throat swelling.  If you were given medicines take as directed.  If you are on coumadin or contraceptives realize their levels and effectiveness is altered by many different medicines.  If you have any reaction (rash, tongues swelling, other) to the medicines stop taking and see a physician.   Please follow up as directed and return to the ER or see a physician for new or worsening symptoms.  Thank you. Filed Vitals:   06/21/14 0807  BP: 164/62  Pulse: 81  Temp: 98 F (36.7 C)  TempSrc: Oral  Resp: 18  Height: 5' (1.524 m)  Weight: 139 lb (63.05 kg)  SpO2: 100%

## 2014-06-21 NOTE — ED Notes (Signed)
Patient has rash on entire body after working in the garden on Thursday. Took two benadryl this morning at 4am

## 2014-06-23 ENCOUNTER — Encounter: Payer: Self-pay | Admitting: Family Medicine

## 2014-06-23 ENCOUNTER — Telehealth: Payer: Self-pay | Admitting: Family Medicine

## 2014-06-23 ENCOUNTER — Ambulatory Visit (INDEPENDENT_AMBULATORY_CARE_PROVIDER_SITE_OTHER): Payer: Medicare Other | Admitting: Family Medicine

## 2014-06-23 VITALS — BP 120/80 | HR 79 | Temp 97.9°F | Ht 59.5 in | Wt 141.0 lb

## 2014-06-23 DIAGNOSIS — L501 Idiopathic urticaria: Secondary | ICD-10-CM

## 2014-06-23 DIAGNOSIS — L200 Besnier's prurigo: Secondary | ICD-10-CM | POA: Diagnosis not present

## 2014-06-23 DIAGNOSIS — L239 Allergic contact dermatitis, unspecified cause: Secondary | ICD-10-CM

## 2014-06-23 DIAGNOSIS — L259 Unspecified contact dermatitis, unspecified cause: Secondary | ICD-10-CM | POA: Diagnosis not present

## 2014-06-23 MED ORDER — TRIAMCINOLONE ACETONIDE 0.5 % EX OINT: 1.0000 "application " | TOPICAL_OINTMENT | Freq: Two times a day (BID) | CUTANEOUS | Status: DC

## 2014-06-23 NOTE — Progress Notes (Signed)
OFFICE NOTE  06/23/2014  CC:  Chief Complaint  Patient presents with  . Rash   HPI: Patient is a 69 y.o. [name] who is here for rash. Started 4 days ago with "blush" on face, then the next day she had facial swelling, face turned more red, forearms and ankles got papulovesicular rash.  Went to ED 2 days ago and was started on steroids for potential contact derm rxn b/c she had been working in her daughter's yard the day prior to onset of the rash. She also had applied sunscreen to the affected areas prior to working in the yard (coppertone SPF 50--the SPF is not what she usually uses but has used coppertone brand before, EXCEPT she didn't apply it to ankles but still got rash.  Very itchy.  She has rx topical steroid but it is almost gone.  Has been taking benadryl last 3 days.  She also has hx of idiopathic urticaria x 2 distinct episodes that started in the fall of 2015.  Pertinent PMH:  Past medical, surgical, social, and family history reviewed and no changes are noted since last office visit.  MEDS:  Outpatient Prescriptions Prior to Visit  Medication Sig Dispense Refill  . aspirin EC 81 MG tablet Take 1 tablet (81 mg total) by mouth daily. (Patient taking differently: Take 81 mg by mouth every other day. ) 30 tablet 0  . Calcium Carbonate-Vitamin D (CALCIUM 600+D) 600-400 MG-UNIT per tablet Take 1 tablet by mouth 2 (two) times daily.    . Ciclopirox 0.77 % gel Apply to affected areas bid prn 30 g 6  . fexofenadine (ALLEGRA) 180 MG tablet Take 1 tablet (180 mg total) by mouth daily. 30 tablet 6  . glucose blood test strip 1 each by Other route daily. Use as instructed 100 each 11  . losartan-hydrochlorothiazide (HYZAAR) 100-25 MG per tablet Take 1 tablet by mouth daily. 90 tablet 3  . metFORMIN (GLUCOPHAGE) 500 MG tablet Take one tablet at dinner for one week. Then, take 1 tablet at breakfast and one tablet at dinner. 270 tablet 0  . Omega-3 Fatty Acids (FISH OIL) 1000 MG  CAPS Take 1 capsule by mouth 2 (two) times daily.    . predniSONE (DELTASONE) 20 MG tablet 3 tabs po daily x 3 days, then 2 tabs x 3 days, then 1.5 tabs x 3 days, then 1 tab x 3 days, then 0.5 tabs x 3 days 27 tablet 0   No facility-administered medications prior to visit.    PE: Blood pressure 120/80, pulse 79, temperature 97.9 F (36.6 C), temperature source Temporal, height 4' 11.5" (1.511 m), weight 141 lb (63.957 kg), SpO2 98 %. Gen: Alert, well appearing.  Patient is oriented to person, place, time, and situation. ENT: no tongue or palate or gingival or throat swelling. No eye drainage. Diffuse facial swelling, including lips.  Mildly erythematous papular rash on face and behind ears and around neck. Arms with diffuse, severe papulovesicular rash with a few large bullae from antecubital regions down to hands. Similar rash on both ankles.    IMPRESSION AND PLAN:  Severe contact dermatitis, also suspicion of possible reaction to her sunscreen. Pt also with hx of idiopathic urticaria in the last year. I will add triamcinolone 0.5% cream to apply to arms and legs bid prn, continue systemic steroids as per recent EDP rx but will recheck her in office in 3-4 d to make sure she doesn't need a slower taper. Continue benadryl until she  is significantly improved, then switch back to daily allegra. Care of skin discussed, signs of infection discussed. Signs/symptoms to call or return for were reviewed and pt expressed understanding. Will ask allergist to see her.  An After Visit Summary was printed and given to the patient.  FOLLOW UP: 3-4 d, recheck rash

## 2014-06-23 NOTE — Telephone Encounter (Signed)
Opened and realized that patient only wanted OV.

## 2014-06-23 NOTE — Progress Notes (Signed)
Pre visit review using our clinic review tool, if applicable. No additional management support is needed unless otherwise documented below in the visit note. 

## 2014-06-24 DIAGNOSIS — L259 Unspecified contact dermatitis, unspecified cause: Secondary | ICD-10-CM | POA: Diagnosis not present

## 2014-06-24 DIAGNOSIS — L239 Allergic contact dermatitis, unspecified cause: Secondary | ICD-10-CM | POA: Diagnosis not present

## 2014-06-26 ENCOUNTER — Ambulatory Visit: Payer: Medicare Other | Admitting: Family Medicine

## 2014-06-26 ENCOUNTER — Encounter: Payer: Self-pay | Admitting: Family Medicine

## 2014-06-26 VITALS — BP 104/70 | HR 71 | Temp 97.3°F | Ht 59.5 in | Wt 139.0 lb

## 2014-06-26 DIAGNOSIS — L309 Dermatitis, unspecified: Secondary | ICD-10-CM | POA: Insufficient documentation

## 2014-06-26 MED ORDER — LOSARTAN POTASSIUM 100 MG PO TABS: 100.0000 mg | ORAL_TABLET | Freq: Every day | ORAL | Status: DC

## 2014-06-26 NOTE — Progress Notes (Signed)
Pre visit review using our clinic review tool, if applicable. No additional management support is needed unless otherwise documented below in the visit note. 

## 2014-06-26 NOTE — Progress Notes (Signed)
OFFICE NOTE  06/26/2014  CC:  Chief Complaint  Patient presents with  . Follow-up   HPI: Patient is a 69 y.o. [name] who is here for f/u of recent severe dermatitis thought to be either from poison ivy or possibly coppertone sunblock.  She saw the allergist 3/29.  She was then referred to the [facility] the same day, same dx arrived at and essentially no change in meds except her oral steroids were kept at higher dose for a while longer and silvadene was rx'd for opened up blister areas. Rash has been present now for about a week, feels signif improved particulary in face.  Still having some new areas of rash (hands, pretibial regions, trunk).  Some bullae are popping on arms.  Denies fevers, myalgias, or joint aches/joint swelling.  No oral lesions.  Pertinent PMH:  Past medical, surgical, social, and family history reviewed and no changes are noted since last office visit.  MEDS:  Outpatient Prescriptions Prior to Visit  Medication Sig Dispense Refill  . aspirin EC 81 MG tablet Take 1 tablet (81 mg total) by mouth daily. (Patient taking differently: Take 81 mg by mouth every other day. ) 30 tablet 0  . Calcium Carbonate-Vitamin D (CALCIUM 600+D) 600-400 MG-UNIT per tablet Take 1 tablet by mouth 2 (two) times daily.    . Ciclopirox 0.77 % gel Apply to affected areas bid prn 30 g 6  . fexofenadine (ALLEGRA) 180 MG tablet Take 1 tablet (180 mg total) by mouth daily. 30 tablet 6  . glucose blood test strip 1 each by Other route daily. Use as instructed 100 each 11  . levothyroxine (SYNTHROID, LEVOTHROID) 125 MCG tablet Take 125 mcg by mouth daily.    Marland Kitchen losartan-hydrochlorothiazide (HYZAAR) 100-25 MG per tablet Take 1 tablet by mouth daily. 90 tablet 3  . metFORMIN (GLUCOPHAGE) 500 MG tablet Take one tablet at dinner for one week. Then, take 1 tablet at breakfast and one tablet at dinner. 270 tablet 0  . Omega-3 Fatty Acids (FISH OIL) 1000 MG CAPS Take 1 capsule by mouth 2  (two) times daily.    . predniSONE (DELTASONE) 20 MG tablet 3 tabs po daily x 3 days, then 2 tabs x 3 days, then 1.5 tabs x 3 days, then 1 tab x 3 days, then 0.5 tabs x 3 days 27 tablet 0  . triamcinolone ointment (KENALOG) 0.5 % Apply 1 application topically 2 (two) times daily. 30 g 1   No facility-administered medications prior to visit.    PE: Blood pressure 104/70, pulse 71, temperature 97.3 F (36.3 C), temperature source Oral, height 4' 11.5" (1.511 m), weight 139 lb (63.05 kg), SpO2 98 %. Gen: Alert, well appearing.  Patient is oriented to person, place, time, and situation. Face with tanned appearance and some superficial peeling of skin but no maceration. Arms: diffuse, deep pinkish hue papular dermatitis, a few 2-3 cm bullae still present. Trunk and pretibal regions and hands show nonbullous/nonvesicular erythematous rash that is scattered papules in some areas and coalesced plaques in others. No new bullae.  LABS: none today Lab Results  Component Value Date   TSH 3.31 03/03/2014   Lab Results  Component Value Date   WBC 6.2 05/24/2012   HGB 12.5 05/24/2012   HCT 40.1 05/24/2012   MCV 66.1* 05/24/2012   PLT 244.0 05/24/2012     Chemistry      Component Value Date/Time   NA 138 05/13/2014 1341   K 4.1  05/13/2014 1341   CL 100 05/13/2014 1341   CO2 32 05/13/2014 1341   BUN 21 05/13/2014 1341   CREATININE 0.68 05/13/2014 1341      Component Value Date/Time   CALCIUM 9.7 05/13/2014 1341   ALKPHOS 71 03/03/2014 1016   AST 24 03/03/2014 1016   ALT 24 03/03/2014 1016   BILITOT 0.6 03/03/2014 1016     Lab Results  Component Value Date   HGBA1C 7.6* 03/03/2014   Lab Results  Component Value Date   CHOL 199 03/03/2014   HDL 41.60 03/03/2014   LDLCALC 126* 03/03/2014   LDLDIRECT 176.6 03/04/2013   TRIG 157.0* 03/03/2014   CHOLHDL 5 03/03/2014    IMPRESSION AND PLAN:  1) Dermatitis of unknown etiology, some improvement noted in some areas but also still  breaking out in new places (although milder rxn).  She has seen allergist and derm, both agreed it is not likely a inherent systemic derm condition.  She did change from valsartan to valsartan/hctz combo in the last few months and I wonder if photosensitivity rxn to hctz could be playing a role.  Will d/c hctz portion of her bp med, new rx for losartan 100mg  sent to mail order pharmacy today. She'll continue her high dose steroids and taper as per derm's recent recommendations. Continue antihistamines and current topical meds. I don't see any sign of bacterial superinfection.  An After Visit Summary was printed and given to the patient.  FOLLOW UP: 7-10 days for f/u skin AND chonic med probs (30 min)

## 2014-06-30 ENCOUNTER — Other Ambulatory Visit: Payer: Self-pay | Admitting: Family Medicine

## 2014-07-07 ENCOUNTER — Ambulatory Visit (INDEPENDENT_AMBULATORY_CARE_PROVIDER_SITE_OTHER): Payer: Medicare Other | Admitting: Family Medicine

## 2014-07-07 ENCOUNTER — Encounter: Payer: Self-pay | Admitting: Family Medicine

## 2014-07-07 VITALS — BP 120/80 | HR 65 | Temp 98.5°F | Ht 59.5 in | Wt 142.0 lb

## 2014-07-07 DIAGNOSIS — L259 Unspecified contact dermatitis, unspecified cause: Secondary | ICD-10-CM | POA: Diagnosis not present

## 2014-07-07 DIAGNOSIS — E782 Mixed hyperlipidemia: Secondary | ICD-10-CM

## 2014-07-07 DIAGNOSIS — Z23 Encounter for immunization: Secondary | ICD-10-CM | POA: Diagnosis not present

## 2014-07-07 DIAGNOSIS — E119 Type 2 diabetes mellitus without complications: Secondary | ICD-10-CM | POA: Diagnosis not present

## 2014-07-07 DIAGNOSIS — I10 Essential (primary) hypertension: Secondary | ICD-10-CM

## 2014-07-07 LAB — HEMOGLOBIN A1C: HEMOGLOBIN A1C: 7.4 % — AB (ref 4.6–6.5)

## 2014-07-07 NOTE — Progress Notes (Signed)
OFFICE NOTE  07/07/2014  CC:  Chief Complaint  Patient presents with  . Follow-up    Rash and Chronic Problems    HPI: Patient is a 69 y.o. [name] who is here for 2 wk f/u recent dermatitis as well as 4 mo f/u DM, HTN, hyperlipidemia. At last visit we d/'d the hctz portion of her bp med due to possibility that this may be involved in the etiology of her rash.  She has been on a long oral steroid taper, starts her last week of this today (dose of 20mg  qd, then she'll stop it).   Rash MUCH improved.  Takes vistaril about once a daily, still on allegra daily.  Fasting glucoses: 95 to low 100s except a couple higher numbers since being on steroids lately. Occ reading over 200 late in afternoon while on steroids.  She has done a great job with dietary changes. Most recent   No home bp monitoring to report.  No bp meds taken today and bp is normal here.  We discussed the prospect of getting on statin med again today.  She continues to think about this but says she is not ready to start this type of med at this time, despite knowing that it is recommended for her.  Pertinent PMH:  Past medical, surgical, social, and family history reviewed and no changes are noted since last office visit.  MEDS:  Outpatient Prescriptions Prior to Visit  Medication Sig Dispense Refill  . aspirin EC 81 MG tablet Take 1 tablet (81 mg total) by mouth daily. (Patient taking differently: Take 81 mg by mouth every other day. ) 30 tablet 0  . Calcium Carbonate-Vitamin D (CALCIUM 600+D) 600-400 MG-UNIT per tablet Take 1 tablet by mouth 2 (two) times daily.    . Ciclopirox 0.77 % gel Apply to affected areas bid prn 30 g 6  . fexofenadine (ALLEGRA) 180 MG tablet Take 1 tablet (180 mg total) by mouth daily. 30 tablet 6  . glucose blood test strip 1 each by Other route daily. Use as instructed 100 each 11  . hydrOXYzine (ATARAX/VISTARIL) 10 MG tablet Take 10 mg by mouth 3 (three) times daily as needed.    Marland Kitchen  levothyroxine (SYNTHROID, LEVOTHROID) 125 MCG tablet TAKE 1 TABLET DAILY 90 tablet 1  . losartan (COZAAR) 100 MG tablet Take 1 tablet (100 mg total) by mouth daily. 90 tablet 3  . metFORMIN (GLUCOPHAGE) 500 MG tablet TAKE ONE TABLET AT DINNER FOR ONE WEEK. THEN, TAKE 1 TABLET AT BREAKFAST AND ONE TABLET AT DINNER. 173 tablet 0  . NON FORMULARY Ascend Cream-for rash and blisters    . Omega-3 Fatty Acids (FISH OIL) 1000 MG CAPS Take 1 capsule by mouth 2 (two) times daily.    . predniSONE (DELTASONE) 20 MG tablet 3 tabs po daily x 3 days, then 2 tabs x 3 days, then 1.5 tabs x 3 days, then 1 tab x 3 days, then 0.5 tabs x 3 days 27 tablet 0  . triamcinolone ointment (KENALOG) 0.5 % Apply 1 application topically 2 (two) times daily. 30 g 1   No facility-administered medications prior to visit.    PE: Blood pressure 120/80, pulse 65, temperature 98.5 F (36.9 C), temperature source Oral, height 4' 11.5" (1.511 m), weight 142 lb (64.411 kg), SpO2 97 %. Gen: Alert, well appearing.  Patient is oriented to person, place, time, and situation. SKIN: no facial swelling or rash. Arms and legs with scattered pinkish macular lesions but  no papules or vesicles/bullae.  No areas of maceration, erythema, or excoriation.  LABS: Lab Results  Component Value Date   HGBA1C 7.6* 03/03/2014    Lab Results  Component Value Date   TSH 3.31 03/03/2014   Lab Results  Component Value Date   WBC 6.2 05/24/2012   HGB 12.5 05/24/2012   HCT 40.1 05/24/2012   MCV 66.1* 05/24/2012   PLT 244.0 05/24/2012   Lab Results  Component Value Date   CREATININE 0.68 05/13/2014   BUN 21 05/13/2014   NA 138 05/13/2014   K 4.1 05/13/2014   CL 100 05/13/2014   CO2 32 05/13/2014   Lab Results  Component Value Date   ALT 24 03/03/2014   AST 24 03/03/2014   ALKPHOS 71 03/03/2014   BILITOT 0.6 03/03/2014   Lab Results  Component Value Date   CHOL 199 03/03/2014   Lab Results  Component Value Date   HDL 41.60  03/03/2014   Lab Results  Component Value Date   LDLCALC 126* 03/03/2014   Lab Results  Component Value Date   TRIG 157.0* 03/03/2014   Lab Results  Component Value Date   CHOLHDL 5 03/03/2014   IMPRESSION AND PLAN:  1) Contact dermatitis: resolving gradually on oral steroid taper. Finish 7 more days of pred 20 qd.  Stopped hctz in case this was a confounding factor.  2) DM 2, well controlled per pt report of home monitoring. HbA1c today, continue current med for now. Pneumovax 23 IM today.  3) HTN; The current medical regimen is effective;  continue present plan and medications.  4) Hyperlipidemia; pt needs to take statin but she is not mentally ready to take this med and may never be. We'll keep touching on this topic at future f/u visits.  An After Visit Summary was printed and given to the patient.  FOLLOW UP: 4 mo

## 2014-07-07 NOTE — Addendum Note (Signed)
Addended by: Julieta Bellini on: 07/07/2014 11:21 AM   Modules accepted: Orders

## 2014-07-07 NOTE — Progress Notes (Signed)
Pre visit review using our clinic review tool, if applicable. No additional management support is needed unless otherwise documented below in the visit note. 

## 2014-08-05 ENCOUNTER — Encounter: Payer: Self-pay | Admitting: Family Medicine

## 2014-09-02 ENCOUNTER — Encounter: Payer: Self-pay | Admitting: *Deleted

## 2014-09-02 ENCOUNTER — Ambulatory Visit (INDEPENDENT_AMBULATORY_CARE_PROVIDER_SITE_OTHER): Payer: Medicare Other | Admitting: Family Medicine

## 2014-09-02 ENCOUNTER — Encounter: Payer: Self-pay | Admitting: Family Medicine

## 2014-09-02 VITALS — BP 140/83 | HR 69 | Temp 98.3°F | Resp 16 | Wt 140.0 lb

## 2014-09-02 DIAGNOSIS — L509 Urticaria, unspecified: Secondary | ICD-10-CM | POA: Diagnosis not present

## 2014-09-02 DIAGNOSIS — I10 Essential (primary) hypertension: Secondary | ICD-10-CM

## 2014-09-02 DIAGNOSIS — E119 Type 2 diabetes mellitus without complications: Secondary | ICD-10-CM | POA: Diagnosis not present

## 2014-09-02 DIAGNOSIS — E039 Hypothyroidism, unspecified: Secondary | ICD-10-CM

## 2014-09-02 MED ORDER — AMLODIPINE BESYLATE 5 MG PO TABS: 5.0000 mg | ORAL_TABLET | Freq: Every day | ORAL | Status: DC

## 2014-09-02 NOTE — Progress Notes (Signed)
OFFICE NOTE  09/02/2014  CC:  Chief Complaint  Patient presents with  . Follow-up    6 month f/u. Pt is fasting.   HPI: Patient is a 69 y.o. [name] who is here for 2 mo f/u DM 2, HTN, hyperlipidemia, recurrent urticarial rash. Fastings getting more consistently <100, commonly 80s.   No checks later in day. BP: avg syst 140s, diast 80s, Pulse around 80.  No side effects from any meds.  No burning, tingling, or numbness in feet.  She has never had a foot ulcer.  Still breaking out in hives every morning, mostly on arms and some along beltline and on thighs--these sometimes itch and sometimes hurt.  They resolve by the afternoon each day.  Continues to take daily antihistamine.   Pertinent PMH:  Past medical, surgical, social, and family history reviewed and no changes are noted since last office visit.  MEDS: Taking metformin, cozaar Outpatient Prescriptions Prior to Visit  Medication Sig Dispense Refill  . aspirin EC 81 MG tablet Take 1 tablet (81 mg total) by mouth daily. (Patient taking differently: Take 81 mg by mouth every other day. ) 30 tablet 0  . Calcium Carbonate-Vitamin D (CALCIUM 600+D) 600-400 MG-UNIT per tablet Take 1 tablet by mouth 2 (two) times daily.    . Ciclopirox 0.77 % gel Apply to affected areas bid prn 30 g 6  . fexofenadine (ALLEGRA) 180 MG tablet Take 1 tablet (180 mg total) by mouth daily. 30 tablet 6  . glucose blood test strip 1 each by Other route daily. Use as instructed 100 each 11  . levothyroxine (SYNTHROID, LEVOTHROID) 125 MCG tablet TAKE 1 TABLET DAILY 90 tablet 1  . losartan (COZAAR) 100 MG tablet Take 1 tablet (100 mg total) by mouth daily. 90 tablet 3  . metFORMIN (GLUCOPHAGE) 500 MG tablet TAKE ONE TABLET AT DINNER FOR ONE WEEK. THEN, TAKE 1 TABLET AT BREAKFAST AND ONE TABLET AT DINNER. 173 tablet 0  . Omega-3 Fatty Acids (FISH OIL) 1000 MG CAPS Take 1 capsule by mouth 2 (two) times daily.    . hydrOXYzine (ATARAX/VISTARIL) 10 MG tablet  Take 10 mg by mouth 3 (three) times daily as needed.    . NON FORMULARY Ascend Cream-for rash and blisters    . predniSONE (DELTASONE) 20 MG tablet 3 tabs po daily x 3 days, then 2 tabs x 3 days, then 1.5 tabs x 3 days, then 1 tab x 3 days, then 0.5 tabs x 3 days (Patient not taking: Reported on 09/02/2014) 27 tablet 0  . triamcinolone ointment (KENALOG) 0.5 % Apply 1 application topically 2 (two) times daily. (Patient not taking: Reported on 09/02/2014) 30 g 1   No facility-administered medications prior to visit.    PE: Blood pressure 140/83, pulse 69, temperature 98.3 F (36.8 C), temperature source Oral, resp. rate 16, weight 140 lb (63.504 kg), SpO2 97 %. Foot exam - bilateral normal; no swelling, tenderness or skin or vascular lesions. Color and temperature is normal. Sensation is intact. Peripheral pulses are palpable. Toenails are normal.   LABS: Lab Results  Component Value Date   TSH 3.31 03/03/2014   Lab Results  Component Value Date   HGBA1C 7.4* 07/07/2014     Chemistry      Component Value Date/Time   NA 138 05/13/2014 1341   K 4.1 05/13/2014 1341   CL 100 05/13/2014 1341   CO2 32 05/13/2014 1341   BUN 21 05/13/2014 1341   CREATININE 0.68 05/13/2014  1341      Component Value Date/Time   CALCIUM 9.7 05/13/2014 1341   ALKPHOS 71 03/03/2014 1016   AST 24 03/03/2014 1016   ALT 24 03/03/2014 1016   BILITOT 0.6 03/03/2014 1016     Lab Results  Component Value Date   WBC 6.2 05/24/2012   HGB 12.5 05/24/2012   HCT 40.1 05/24/2012   MCV 66.1* 05/24/2012   PLT 244.0 05/24/2012     IMPRESSION AND PLAN:  1) HTN; not ideal control (still too many stage 1 systolic readings at home). Add amlodipine 5mg  qd to her cozaar 100 mg qd.  2) DM 2, control good as per fasting numbers. Next Hba1c in 4 mo. Continue metformin 500 mg bid. Feet exam normal today. She was unable to urinate today to get urine microalb/cr test so we'll try this again at next f/u visit.  3)  Hyperlipidemia: she is simply not on board for statin use at this time.  Will periodically check in with her about this issue when she follows up.  4) Recurrent urticarial rash: idiopathic.  Watchful waiting approach, plus take daily nonsedating antihistamine. Signs/symptoms to call or return for were reviewed and pt expressed understanding.  5) Hypothyroidism: TSH has been stable over the last couple of years. Next TSH check due 02/2015.  An After Visit Summary was printed and given to the patient.  FOLLOW UP: 4 mo

## 2014-09-02 NOTE — Progress Notes (Signed)
Pre visit review using our clinic review tool, if applicable. No additional management support is needed unless otherwise documented below in the visit note. 

## 2014-09-03 ENCOUNTER — Other Ambulatory Visit: Payer: Self-pay | Admitting: *Deleted

## 2014-09-03 MED ORDER — AMLODIPINE BESYLATE 5 MG PO TABS: 5.0000 mg | ORAL_TABLET | Freq: Every day | ORAL | Status: DC

## 2014-09-03 NOTE — Telephone Encounter (Signed)
Pt called stating that [facility] did not receive Rx for amlodipine. After reviewing her chart Rx was sent to [facility] order. New Rx was sent to [facility] and will contact [place] to cancel order for amlodipine.

## 2014-09-23 ENCOUNTER — Telehealth: Payer: Self-pay | Admitting: Family Medicine

## 2014-09-23 NOTE — Telephone Encounter (Signed)
Patient requesting CB. She is having an allergic reaction to her lunch. She is not having any trouble breathing but her tongue is swelling.

## 2014-09-23 NOTE — Telephone Encounter (Signed)
Spoke to [employer] she stated that if pt has any coughing, need to clear her throat, wheezing or shortness of breath she should go to ER. Otherwise she can take 50mg  Benadryl and 20mg  Pepcid. Pt advised and denied any cough, need to clear throat, wheezing or shortness of breath. She was advised that if she started having any of these symptoms then she will need to go to ER or call EMS. Pt voiced understanding.

## 2014-10-13 ENCOUNTER — Other Ambulatory Visit: Payer: Self-pay | Admitting: *Deleted

## 2014-10-13 MED ORDER — METFORMIN HCL 500 MG PO TABS: ORAL_TABLET | ORAL | Status: DC

## 2014-10-13 NOTE — Telephone Encounter (Signed)
Please review sig for Metformin.Thanks  Pt LMOM requesting refill for Metformin be sent to [facility] order. LOV: 09/02/14 NOV: 01/02/15 Last written: 06/30/14 #[id number] w/ 0RF  Please advise.Thanks.

## 2014-10-15 ENCOUNTER — Encounter: Payer: Self-pay | Admitting: *Deleted

## 2014-10-15 ENCOUNTER — Telehealth: Payer: Self-pay | Admitting: Family Medicine

## 2014-10-15 NOTE — Telephone Encounter (Signed)
[facility] called and said that the RX for glucose strips that where ordered are off the market. They suggested that pt use One Touch and asked we send a RX for those.

## 2014-10-15 NOTE — Telephone Encounter (Signed)
Rx printed, waiting for providers signature to be faxed.

## 2015-01-02 ENCOUNTER — Encounter: Payer: Self-pay | Admitting: *Deleted

## 2015-01-02 ENCOUNTER — Encounter: Payer: Self-pay | Admitting: Family Medicine

## 2015-01-02 ENCOUNTER — Ambulatory Visit (INDEPENDENT_AMBULATORY_CARE_PROVIDER_SITE_OTHER): Payer: Medicare Other | Admitting: Family Medicine

## 2015-01-02 VITALS — BP 141/86 | HR 68 | Temp 98.2°F | Resp 16 | Ht 59.5 in | Wt 142.0 lb

## 2015-01-02 DIAGNOSIS — E039 Hypothyroidism, unspecified: Secondary | ICD-10-CM | POA: Diagnosis not present

## 2015-01-02 DIAGNOSIS — I10 Essential (primary) hypertension: Secondary | ICD-10-CM

## 2015-01-02 DIAGNOSIS — Z23 Encounter for immunization: Secondary | ICD-10-CM

## 2015-01-02 DIAGNOSIS — E119 Type 2 diabetes mellitus without complications: Secondary | ICD-10-CM

## 2015-01-02 MED ORDER — LEVOTHYROXINE SODIUM 125 MCG PO TABS: 125.0000 ug | ORAL_TABLET | Freq: Every day | ORAL | Status: DC

## 2015-01-02 MED ORDER — AMLODIPINE BESYLATE 5 MG PO TABS: 5.0000 mg | ORAL_TABLET | Freq: Every day | ORAL | Status: DC

## 2015-01-02 NOTE — Progress Notes (Signed)
OFFICE VISIT  01/02/2015   CC:  Chief Complaint  Patient presents with  . Follow-up    Pt is not fasting.    HPI:    Patient is a 69 y.o. [name] who presents for 4 mo f/u DM 2, HTN, hypothyroidism.  Glucoses: fastings-avg 100 or so.   Home BP monitoring: syst avg 130s, diast 70s, HR 70 avg. She had labs for an insurance policy 09/02/10: W5Y was 6.4%, CMET normal, urine microalb/cr normal, lipids a bit high as per her usual (these results will be scanned into chart).  Compliant with thyroid med daily/takes it correctly.  No acute complaints.  ROS: no hives except a few weeks ago had a few on hands for a few hours  Past Medical History  Diagnosis Date  . HTN (hypertension)   . Hypothyroidism   . Depression   . Congenital sensorineural hearing loss     deaf in right ear; mild presbycusis in left ear (audiologist 2014)  . CAP (community acquired pneumonia) 02/2010  . Beta thalassemia, heterozygous   . Somnolence, daytime, controlled 04/22/2012  . Hyperlipidemia, mixed     Refuses meds  . Type II or unspecified type diabetes mellitus without mention of complication, uncontrolled     08/2012 pt refused to start meds despite rising HbA1c on diet only; eventually agreed to start meds  . Osteopenia     DEXAs 2003-2013--pt has declined bisphosphonates  . Vitamin D deficiency     Past Surgical History  Procedure Laterality Date  . Tonsillectomy and adenoidectomy      as a child  . Colonoscopy  2008    Normal.  10 yr recall.  . Breast biopsy      Left breast biopsy x 2 (fibrocystic/benign)    Outpatient Prescriptions Prior to Visit  Medication Sig Dispense Refill  . aspirin EC 81 MG tablet Take 1 tablet (81 mg total) by mouth daily. (Patient taking differently: Take 81 mg by mouth every other day. ) 30 tablet 0  . Calcium Carbonate-Vitamin D (CALCIUM 600+D) 600-400 MG-UNIT per tablet Take 1 tablet by mouth 2 (two) times daily.    . Ciclopirox 0.77 % gel Apply to  affected areas bid prn 30 g 6  . glucose blood test strip 1 each by Other route daily. Use as instructed 100 each 11  . losartan (COZAAR) 100 MG tablet Take 1 tablet (100 mg total) by mouth daily. 90 tablet 3  . metFORMIN (GLUCOPHAGE) 500 MG tablet 1 tab po bid 180 tablet 3  . NON FORMULARY Ascend Cream-for rash and blisters    . Omega-3 Fatty Acids (FISH OIL) 1000 MG CAPS Take 1 capsule by mouth 2 (two) times daily.    Marland Kitchen amLODipine (NORVASC) 5 MG tablet Take 1 tablet (5 mg total) by mouth daily. 30 tablet 4  . levothyroxine (SYNTHROID, LEVOTHROID) 125 MCG tablet TAKE 1 TABLET DAILY 90 tablet 1  . fexofenadine (ALLEGRA) 180 MG tablet Take 1 tablet (180 mg total) by mouth daily. (Patient not taking: Reported on 01/02/2015) 30 tablet 6  . hydrOXYzine (ATARAX/VISTARIL) 10 MG tablet Take 10 mg by mouth 3 (three) times daily as needed.    . triamcinolone ointment (KENALOG) 0.5 % Apply 1 application topically 2 (two) times daily. (Patient not taking: Reported on 01/02/2015) 30 g 1   No facility-administered medications prior to visit.    No Known Allergies  ROS As per HPI  PE: Blood pressure 141/86, pulse 68, temperature 98.2 F (36.8  C), temperature source Oral, resp. rate 16, height 4' 11.5" (1.511 m), weight 142 lb (64.411 kg), SpO2 98 %. Gen: Alert, well appearing.  Patient is oriented to person, place, time, and situation. AFFECT: pleasant, lucid thought and speech. No further exam today.  LABS:  Lab Results  Component Value Date   TSH 3.31 03/03/2014     Chemistry      Component Value Date/Time   NA 138 05/13/2014 1341   K 4.1 05/13/2014 1341   CL 100 05/13/2014 1341   CO2 32 05/13/2014 1341   BUN 21 05/13/2014 1341   CREATININE 0.68 05/13/2014 1341      Component Value Date/Time   CALCIUM 9.7 05/13/2014 1341   ALKPHOS 71 03/03/2014 1016   AST 24 03/03/2014 1016   ALT 24 03/03/2014 1016   BILITOT 0.6 03/03/2014 1016     Lab Results  Component Value Date   HGBA1C 7.4*  07/07/2014   Lab Results  Component Value Date   CHOL 199 03/03/2014   HDL 41.60 03/03/2014   LDLCALC 126* 03/03/2014   LDLDIRECT 176.6 03/04/2013   TRIG 157.0* 03/03/2014   CHOLHDL 5 03/03/2014     IMPRESSION AND PLAN:  1) DM 2, good control. The current medical regimen is effective;  continue present plan and medications.  2) Hypothyroidism: The current medical regimen is effective;  continue present plan and medications. Recheck TSH at next f/u in 4 mo when we will be getting her other "routine" labs.  3) HTN; The current medical regimen is effective;  continue present plan and medications. Recent cr/lytes normal 10/29/14.  4) Hyperlipidemia: pt refuses statins.  FOLLOW UP: Return in about 4 months (around 05/05/2015) for routine chronic illness f/u.

## 2015-01-02 NOTE — Progress Notes (Signed)
Pre visit review using our clinic review tool, if applicable. No additional management support is needed unless otherwise documented below in the visit note. 

## 2015-02-26 ENCOUNTER — Encounter: Payer: Self-pay | Admitting: Family Medicine

## 2015-05-06 ENCOUNTER — Encounter: Payer: Self-pay | Admitting: Family Medicine

## 2015-05-06 ENCOUNTER — Other Ambulatory Visit: Payer: Self-pay

## 2015-05-06 ENCOUNTER — Ambulatory Visit (INDEPENDENT_AMBULATORY_CARE_PROVIDER_SITE_OTHER): Payer: Medicare Other | Admitting: Family Medicine

## 2015-05-06 VITALS — BP 118/81 | HR 69 | Temp 98.0°F | Resp 16 | Ht 59.5 in | Wt 143.8 lb

## 2015-05-06 DIAGNOSIS — E039 Hypothyroidism, unspecified: Secondary | ICD-10-CM | POA: Diagnosis not present

## 2015-05-06 DIAGNOSIS — E119 Type 2 diabetes mellitus without complications: Secondary | ICD-10-CM | POA: Diagnosis not present

## 2015-05-06 DIAGNOSIS — I10 Essential (primary) hypertension: Secondary | ICD-10-CM | POA: Diagnosis not present

## 2015-05-06 DIAGNOSIS — Z1231 Encounter for screening mammogram for malignant neoplasm of breast: Secondary | ICD-10-CM

## 2015-05-06 LAB — BASIC METABOLIC PANEL
BUN: 17 mg/dL (ref 6–23)
CO2: 29 mEq/L (ref 19–32)
Calcium: 9.6 mg/dL (ref 8.4–10.5)
Chloride: 105 mEq/L (ref 96–112)
Creatinine, Ser: 0.63 mg/dL (ref 0.40–1.20)
GFR: 98.78 mL/min (ref >60.00)
GLUCOSE: 98 mg/dL (ref 70–99)
POTASSIUM: 4.4 meq/L (ref 3.5–5.1)
SODIUM: 142 meq/L (ref 135–145)

## 2015-05-06 LAB — HEMOGLOBIN A1C: Hgb A1c MFr Bld: 6.4 % (ref 4.6–6.5)

## 2015-05-06 LAB — TSH: TSH: 0.1 u[IU]/mL — ABNORMAL LOW (ref 0.35–4.50)

## 2015-05-06 NOTE — Progress Notes (Signed)
OFFICE VISIT  05/06/2015   CC:  Chief Complaint  Patient presents with  . Follow-up    Pt is not fasting.      HPI:    Patient is a 70 y.o. [name] who presents for 4 mo f/u DM 2, HTN, hyperlipidemia, hypothyroidism.  Feels well, no complaints.  Compliant with thyroid med, due for TSH check today.  Glucoses 120s or less fasting.  Takes metformin with 95% compliance. Eye exam is no her "to do" list.  Home bp monitoring: avg 140/70s avg.   She is trying to start getting into habit of walking for exercise. Diet: low carb, low sodium.  ROS: no CP, no sOB, no feet pain, no vision complaints.  Past Medical History  Diagnosis Date  . HTN (hypertension)   . Hypothyroidism   . Depression   . Congenital sensorineural hearing loss     deaf in right ear; mild presbycusis in left ear (audiologist 2014)  . CAP (community acquired pneumonia) 02/2010  . Beta thalassemia, heterozygous   . Somnolence, daytime, controlled 04/22/2012  . Hyperlipidemia, mixed     Refuses statins  . Type II or unspecified type diabetes mellitus without mention of complication, uncontrolled   . Osteopenia     DEXAs 2003-2013--pt has declined bisphosphonates  . Vitamin D deficiency     Past Surgical History  Procedure Laterality Date  . Tonsillectomy and adenoidectomy      as a child  . Colonoscopy  2008    Normal.  10 yr recall.  . Breast biopsy      Left breast biopsy x 2 (fibrocystic/benign)    Outpatient Prescriptions Prior to Visit  Medication Sig Dispense Refill  . amLODipine (NORVASC) 5 MG tablet Take 1 tablet (5 mg total) by mouth daily. 90 tablet 3  . aspirin EC 81 MG tablet Take 1 tablet (81 mg total) by mouth daily. (Patient taking differently: Take 81 mg by mouth every other day. ) 30 tablet 0  . Calcium Carbonate-Vitamin D (CALCIUM 600+D) 600-400 MG-UNIT per tablet Take 1 tablet by mouth 2 (two) times daily.    . Ciclopirox 0.77 % gel Apply to affected areas bid prn 30 g 6  .  glucose blood test strip 1 each by Other route daily. Use as instructed 100 each 11  . levothyroxine (SYNTHROID, LEVOTHROID) 125 MCG tablet Take 1 tablet (125 mcg total) by mouth daily. 90 tablet 3  . losartan (COZAAR) 100 MG tablet Take 1 tablet (100 mg total) by mouth daily. 90 tablet 3  . metFORMIN (GLUCOPHAGE) 500 MG tablet 1 tab po bid 180 tablet 3  . Omega-3 Fatty Acids (FISH OIL) 1000 MG CAPS Take 1 capsule by mouth 2 (two) times daily.    . NON FORMULARY Reported on 05/06/2015     No facility-administered medications prior to visit.    No Known Allergies  ROS As per HPI  PE: Blood pressure 118/81, pulse 69, temperature 98 F (36.7 C), temperature source Oral, resp. rate 16, height 4' 11.5" (1.511 m), weight 143 lb 12 oz (65.205 kg), SpO2 98 %. Gen: Alert, well appearing.  Patient is oriented to person, place, time, and situation. AFFECT: pleasant, lucid thought and speech. No further exam today.  LABS:  Lab Results  Component Value Date   TSH 3.31 03/03/2014   Lab Results  Component Value Date   WBC 6.2 05/24/2012   HGB 12.5 05/24/2012   HCT 40.1 05/24/2012   MCV 66.1* 05/24/2012  PLT 244.0 05/24/2012   Lab Results  Component Value Date   CREATININE 0.68 05/13/2014   BUN 21 05/13/2014   NA 138 05/13/2014   K 4.1 05/13/2014   CL 100 05/13/2014   CO2 32 05/13/2014   Lab Results  Component Value Date   ALT 24 03/03/2014   AST 24 03/03/2014   ALKPHOS 71 03/03/2014   BILITOT 0.6 03/03/2014   Lab Results  Component Value Date   CHOL 199 03/03/2014   Lab Results  Component Value Date   HDL 41.60 03/03/2014   Lab Results  Component Value Date   LDLCALC 126* 03/03/2014   Lab Results  Component Value Date   TRIG 157.0* 03/03/2014   Lab Results  Component Value Date   CHOLHDL 5 03/03/2014   Lab Results  Component Value Date   HGBA1C 7.4* 07/07/2014    IMPRESSION AND PLAN:  1) DM 2, good control. Hba1c today. She'll arrange appt for diab  retpthy screening exam ASAP.  2) HTN; The current medical regimen is effective;  continue present plan and medications. Lytes/cr today.  3) Hypothyroidism: TSH monitoring today.  4) Hyperlipidemia: pt continues to decline statin treatment.  5) Prev health care: vaccines UTD but she'll be due for prevnar 13 after 07/07/15.  An After Visit Summary was printed and given to the patient.  FOLLOW UP: Return in about 4 months (around 09/03/2015).

## 2015-05-06 NOTE — Progress Notes (Signed)
Pre visit review using our clinic review tool, if applicable. No additional management support is needed unless otherwise documented below in the visit note. 

## 2015-05-07 ENCOUNTER — Encounter: Payer: Self-pay | Admitting: *Deleted

## 2015-06-03 DIAGNOSIS — D1801 Hemangioma of skin and subcutaneous tissue: Secondary | ICD-10-CM | POA: Diagnosis not present

## 2015-06-03 DIAGNOSIS — D225 Melanocytic nevi of trunk: Secondary | ICD-10-CM | POA: Diagnosis not present

## 2015-06-03 DIAGNOSIS — L821 Other seborrheic keratosis: Secondary | ICD-10-CM | POA: Diagnosis not present

## 2015-06-03 DIAGNOSIS — L570 Actinic keratosis: Secondary | ICD-10-CM | POA: Diagnosis not present

## 2015-06-04 ENCOUNTER — Ambulatory Visit: Payer: Medicare Other

## 2015-06-04 ENCOUNTER — Ambulatory Visit
Admission: RE | Admit: 2015-06-04 | Discharge: 2015-06-04 | Disposition: A | Discharge status: Home / Self Care | Payer: Medicare Other | Source: Ambulatory Visit

## 2015-06-04 DIAGNOSIS — Z1231 Encounter for screening mammogram for malignant neoplasm of breast: Secondary | ICD-10-CM

## 2015-06-05 DIAGNOSIS — E119 Type 2 diabetes mellitus without complications: Secondary | ICD-10-CM | POA: Diagnosis not present

## 2015-06-05 LAB — HM DIABETES EYE EXAM

## 2015-06-17 ENCOUNTER — Other Ambulatory Visit: Payer: Self-pay | Admitting: *Deleted

## 2015-06-17 DIAGNOSIS — E039 Hypothyroidism, unspecified: Secondary | ICD-10-CM

## 2015-06-23 ENCOUNTER — Other Ambulatory Visit (INDEPENDENT_AMBULATORY_CARE_PROVIDER_SITE_OTHER): Payer: Medicare Other

## 2015-06-23 DIAGNOSIS — E039 Hypothyroidism, unspecified: Secondary | ICD-10-CM | POA: Diagnosis not present

## 2015-06-23 LAB — TSH: TSH: 0.77 u[IU]/mL (ref 0.35–4.50)

## 2015-07-22 ENCOUNTER — Encounter: Payer: Self-pay | Admitting: Family Medicine

## 2015-08-20 ENCOUNTER — Other Ambulatory Visit: Payer: Self-pay | Admitting: *Deleted

## 2015-08-20 MED ORDER — LOSARTAN POTASSIUM 100 MG PO TABS: 100.0000 mg | ORAL_TABLET | Freq: Every day | ORAL | Status: DC

## 2015-08-20 NOTE — Telephone Encounter (Signed)
RF request for losartan LOV: 05/06/15 Next ov: 09/08/15 Last written: 06/26/14 #90 w/ 3RF  Rx sent. Pt advised and voiced understanding.

## 2015-09-03 ENCOUNTER — Ambulatory Visit: Payer: Medicare Other | Admitting: Family Medicine

## 2015-09-08 ENCOUNTER — Encounter: Payer: Self-pay | Admitting: Family Medicine

## 2015-09-08 ENCOUNTER — Ambulatory Visit (INDEPENDENT_AMBULATORY_CARE_PROVIDER_SITE_OTHER): Payer: Medicare Other | Admitting: Family Medicine

## 2015-09-08 VITALS — BP 124/83 | HR 75 | Temp 97.9°F | Resp 16 | Ht 59.5 in | Wt 145.0 lb

## 2015-09-08 DIAGNOSIS — Z23 Encounter for immunization: Secondary | ICD-10-CM

## 2015-09-08 DIAGNOSIS — I10 Essential (primary) hypertension: Secondary | ICD-10-CM | POA: Diagnosis not present

## 2015-09-08 DIAGNOSIS — E782 Mixed hyperlipidemia: Secondary | ICD-10-CM

## 2015-09-08 DIAGNOSIS — E119 Type 2 diabetes mellitus without complications: Secondary | ICD-10-CM | POA: Diagnosis not present

## 2015-09-08 LAB — POCT GLYCOSYLATED HEMOGLOBIN (HGB A1C): Hemoglobin A1C: 5.8

## 2015-09-08 NOTE — Progress Notes (Signed)
OFFICE VISIT  09/08/2015   CC:  Chief Complaint  Patient presents with  . Follow-up    Pt is not fasting.      HPI:    Patient is a 70 y.o. [name] who presents for 4 mo f/u DM 2, HTN, HLD.  DM 2: compliant with meds, avg fasting gluc 110-120 last few months. Her gluc's fluctuate with her stress.  She does try to eat diabetic diet for the most part.  BP monitoring: Avg 140 syst, diast 70  Not on exercise regimen as of yet but she is not sedentary. Has hx of mild hyperlipidemia and we have discussed starting a statin, esp since she has DM 2, but she has repeatedly declined b/c of fear of possible side effects.  ROS: no HAs, no blurry vision, no dizziness, no CP, no SOB, no feet complaints.  No myalgias or arthralgias.  No rashes.  Past Medical History  Diagnosis Date  . HTN (hypertension)   . Hypothyroidism   . Depression   . Congenital sensorineural hearing loss     deaf in right ear; mild presbycusis in left ear (audiologist 2014)  . CAP (community acquired pneumonia) 02/2010  . Beta thalassemia, heterozygous   . Somnolence, daytime, controlled 04/22/2012  . Hyperlipidemia, mixed     Refuses statins  . Type II or unspecified type diabetes mellitus without mention of complication, uncontrolled   . Osteopenia     DEXAs 2003-2013--pt has declined bisphosphonates  . Vitamin D deficiency     Past Surgical History  Procedure Laterality Date  . Tonsillectomy and adenoidectomy      as a child  . Colonoscopy  2008    Normal.  10 yr recall.  . Breast biopsy      Left breast biopsy x 2 (fibrocystic/benign)    Outpatient Prescriptions Prior to Visit  Medication Sig Dispense Refill  . amLODipine (NORVASC) 5 MG tablet Take 1 tablet (5 mg total) by mouth daily. 90 tablet 3  . aspirin EC 81 MG tablet Take 1 tablet (81 mg total) by mouth daily. (Patient taking differently: Take 81 mg by mouth every other day. ) 30 tablet 0  . Calcium Carbonate-Vitamin D (CALCIUM  600+D) 600-400 MG-UNIT per tablet Take 1 tablet by mouth 2 (two) times daily.    Marland Kitchen glucose blood test strip 1 each by Other route daily. Use as instructed 100 each 11  . levothyroxine (SYNTHROID, LEVOTHROID) 125 MCG tablet Take 1 tablet (125 mcg total) by mouth daily. 90 tablet 3  . losartan (COZAAR) 100 MG tablet Take 1 tablet (100 mg total) by mouth daily. 90 tablet 3  . metFORMIN (GLUCOPHAGE) 500 MG tablet 1 tab po bid 180 tablet 3  . Omega-3 Fatty Acids (FISH OIL) 1000 MG CAPS Take 1 capsule by mouth 2 (two) times daily.    . Ciclopirox 0.77 % gel Apply to affected areas bid prn (Patient not taking: Reported on 09/08/2015) 30 g 6   No facility-administered medications prior to visit.    No Known Allergies  ROS As per HPI  PE: Blood pressure 124/83, pulse 75, temperature 97.9 F (36.6 C), temperature source Oral, resp. rate 16, height 4' 11.5" (1.511 m), weight 145 lb (65.772 kg), SpO2 95 %. Gen: Alert, well appearing.  Patient is oriented to person, place, time, and situation. AFFECT: pleasant, lucid thought and speech. Foot exam - bilateral normal; no swelling, tenderness or skin or vascular lesions. Color and temperature is normal. Sensation is intact.  Peripheral pulses are palpable. Toenails are normal.   LABS:  Lab Results  Component Value Date   HGBA1C 5.8 09/08/2015   Lab Results  Component Value Date   TSH 0.77 06/23/2015     Chemistry      Component Value Date/Time   NA 142 05/06/2015 0923   K 4.4 05/06/2015 0923   CL 105 05/06/2015 0923   CO2 29 05/06/2015 0923   BUN 17 05/06/2015 0923   CREATININE 0.63 05/06/2015 0923      Component Value Date/Time   CALCIUM 9.6 05/06/2015 0923   ALKPHOS 71 03/03/2014 1016   AST 24 03/03/2014 1016   ALT 24 03/03/2014 1016   BILITOT 0.6 03/03/2014 1016     Lab Results  Component Value Date   CHOL 199 03/03/2014   HDL 41.60 03/03/2014   LDLCALC 126* 03/03/2014   LDLDIRECT 176.6 03/04/2013   TRIG 157.0* 03/03/2014    CHOLHDL 5 03/03/2014    POCT HbA1c today: 5.8%  IMPRESSION AND PLAN:  1) DM 2; great control.  The current medical regimen is effective;  continue present plan and medications. A1c 5.8% today. Prevnar 13 given today.  2) HTN: The current medical regimen is effective;  continue present plan and medications. Lytes/cr good 4 mo ago.  Will repeat these at next f/u in 4 mo.  3) Hyperlipidemia, mixed: pt declines statins but will periodically re-discuss the possibility of getting on one of these meds.  An After Visit Summary was printed and given to the patient.  FOLLOW UP: Return in about 4 months (around 01/08/2016) for annual CPE (fasting).  Signed:  Crissie Sickles, MD           09/08/2015

## 2015-09-08 NOTE — Progress Notes (Signed)
Pre visit review using our clinic review tool, if applicable. No additional management support is needed unless otherwise documented below in the visit note. 

## 2015-10-13 ENCOUNTER — Ambulatory Visit (INDEPENDENT_AMBULATORY_CARE_PROVIDER_SITE_OTHER): Payer: Medicare Other | Admitting: Family Medicine

## 2015-10-13 ENCOUNTER — Encounter: Payer: Self-pay | Admitting: Family Medicine

## 2015-10-13 VITALS — BP 150/80 | HR 69 | Temp 98.1°F | Resp 16 | Ht 59.5 in | Wt 145.8 lb

## 2015-10-13 DIAGNOSIS — T7840XA Allergy, unspecified, initial encounter: Secondary | ICD-10-CM | POA: Diagnosis not present

## 2015-10-13 DIAGNOSIS — H5789 Other specified disorders of eye and adnexa: Secondary | ICD-10-CM

## 2015-10-13 DIAGNOSIS — H578 Other specified disorders of eye and adnexa: Secondary | ICD-10-CM | POA: Diagnosis not present

## 2015-10-13 MED ORDER — METHYLPREDNISOLONE ACETATE 40 MG/ML IJ SUSP
40.0000 mg | Freq: Once | INTRAMUSCULAR | Status: AC
  Administered 2015-10-13: 40 mg via INTRAMUSCULAR

## 2015-10-13 NOTE — Progress Notes (Signed)
Pre visit review using our clinic review tool, if applicable. No additional management support is needed unless otherwise documented below in the visit note. 

## 2015-10-13 NOTE — Progress Notes (Signed)
OFFICE VISIT  10/13/2015   CC:  Chief Complaint  Patient presents with  . Facial Swelling    left eye x 4 days   HPI:    Patient is a 70 y.o. [name] who presents for left eye itching and swelling that started 5 days ago.  Turned pink/red around eyelids.  Has been taking allegra 180mg  qd since onset. Took otc antihistamine drops a couple times. No drainage, just very itching inside eyelid and around orbit.  No eye pain or vision probs. She recalls no bite or potential contact irritant except that she did some gardening the night it started. No swelling of cheek, lips, other eye, or tongue.  No hives. The swelling/itching has peaked and has started to abate some the last 24-36h. No fevers.  Past Medical History  Diagnosis Date  . HTN (hypertension)   . Hypothyroidism   . Depression   . Congenital sensorineural hearing loss     deaf in right ear; mild presbycusis in left ear (audiologist 2014)  . CAP (community acquired pneumonia) 02/2010  . Beta thalassemia, heterozygous   . Somnolence, daytime, controlled 04/22/2012  . Hyperlipidemia, mixed     Refuses statins  . Type II or unspecified type diabetes mellitus without mention of complication, uncontrolled   . Osteopenia     DEXAs 2003-2013--pt has declined bisphosphonates  . Vitamin D deficiency     Past Surgical History  Procedure Laterality Date  . Tonsillectomy and adenoidectomy      as a child  . Colonoscopy  2008    Normal.  10 yr recall.  . Breast biopsy      Left breast biopsy x 2 (fibrocystic/benign)    Outpatient Prescriptions Prior to Visit  Medication Sig Dispense Refill  . amLODipine (NORVASC) 5 MG tablet Take 1 tablet (5 mg total) by mouth daily. 90 tablet 3  . aspirin EC 81 MG tablet Take 1 tablet (81 mg total) by mouth daily. (Patient taking differently: Take 81 mg by mouth every other day. ) 30 tablet 0  . Calcium Carbonate-Vitamin D (CALCIUM 600+D) 600-400 MG-UNIT per tablet Take 1 tablet by  mouth 2 (two) times daily.    Marland Kitchen glucose blood test strip 1 each by Other route daily. Use as instructed 100 each 11  . levothyroxine (SYNTHROID, LEVOTHROID) 125 MCG tablet Take 1 tablet (125 mcg total) by mouth daily. 90 tablet 3  . losartan (COZAAR) 100 MG tablet Take 1 tablet (100 mg total) by mouth daily. 90 tablet 3  . metFORMIN (GLUCOPHAGE) 500 MG tablet 1 tab po bid 180 tablet 3  . Omega-3 Fatty Acids (FISH OIL) 1000 MG CAPS Take 1 capsule by mouth 2 (two) times daily.     No facility-administered medications prior to visit.    No Known Allergies  ROS As per HPI  PE: Blood pressure 150/80, pulse 69, temperature 98.1 F (36.7 C), temperature source Oral, resp. rate 16, height 4' 11.5" (1.511 m), weight 145 lb 12 oz (66.112 kg), SpO2 96 %. Gen: Alert, well appearing.  Patient is oriented to person, place, time, and situation. EOMI, PERRL. Left peri-orbital tissues mildly swollen, but nontender and without erythema.  No eye d/c. Globe w/out signif injection and with no edema.  LABS:  none  IMPRESSION AND PLAN:  Left peri-orbital allergic/contact derm rxn. No sign of angioedema elsewhere. No sign of infection. Plan: continue allegra qd and otc antihistamine eye gtts 1 drop bid prn, and give 40mg  depo-medrol IM in office  today.  An After Visit Summary was printed and given to the patient.  FOLLOW UP: Return if symptoms worsen or fail to improve.  Signed:  Crissie Sickles, MD           10/13/2015

## 2015-10-13 NOTE — Addendum Note (Signed)
Addended by: Onalee Hua on: 10/13/2015 11:16 AM   Modules accepted: Orders

## 2015-11-16 ENCOUNTER — Other Ambulatory Visit: Payer: Self-pay | Admitting: *Deleted

## 2015-11-16 MED ORDER — GLUCOSE BLOOD VI STRP: 1.0000 | ORAL_STRIP | Freq: Every day | 11 refills | Status: DC

## 2015-11-16 NOTE — Telephone Encounter (Signed)
Pt LMOM on 11/16/15 at 9:54am requesting refill for DM test strips. Rx sent. Pt advised and voiced understanding.

## 2015-12-04 ENCOUNTER — Other Ambulatory Visit: Payer: Self-pay

## 2015-12-04 DIAGNOSIS — E119 Type 2 diabetes mellitus without complications: Secondary | ICD-10-CM

## 2015-12-04 MED ORDER — METFORMIN HCL 500 MG PO TABS: ORAL_TABLET | ORAL | 3 refills | Status: DC

## 2015-12-08 ENCOUNTER — Telehealth: Payer: Self-pay

## 2015-12-08 NOTE — Telephone Encounter (Signed)
Spoke with patient to schedule MWV, declined at this time.

## 2016-01-06 ENCOUNTER — Other Ambulatory Visit (INDEPENDENT_AMBULATORY_CARE_PROVIDER_SITE_OTHER): Payer: Medicare Other

## 2016-01-06 DIAGNOSIS — E785 Hyperlipidemia, unspecified: Secondary | ICD-10-CM | POA: Diagnosis not present

## 2016-01-06 DIAGNOSIS — E119 Type 2 diabetes mellitus without complications: Secondary | ICD-10-CM

## 2016-01-06 DIAGNOSIS — E039 Hypothyroidism, unspecified: Secondary | ICD-10-CM | POA: Diagnosis not present

## 2016-01-06 DIAGNOSIS — Z0000 Encounter for general adult medical examination without abnormal findings: Secondary | ICD-10-CM | POA: Diagnosis not present

## 2016-01-06 LAB — COMPREHENSIVE METABOLIC PANEL
ALK PHOS: 65 U/L (ref 39–117)
ALT: 14 U/L (ref 0–35)
AST: 17 U/L (ref 0–37)
Albumin: 4.3 g/dL (ref 3.5–5.2)
BILIRUBIN TOTAL: 0.4 mg/dL (ref 0.2–1.2)
BUN: 15 mg/dL (ref 6–23)
CALCIUM: 9.7 mg/dL (ref 8.4–10.5)
CO2: 29 meq/L (ref 19–32)
CREATININE: 0.69 mg/dL (ref 0.40–1.20)
Chloride: 103 mEq/L (ref 96–112)
GFR: 88.77 mL/min (ref >60.00)
GLUCOSE: 109 mg/dL — AB (ref 70–99)
Potassium: 4.8 mEq/L (ref 3.5–5.1)
Sodium: 141 mEq/L (ref 135–145)
TOTAL PROTEIN: 6.9 g/dL (ref 6.0–8.3)

## 2016-01-06 LAB — LIPID PANEL
Cholesterol: 198 mg/dL (ref 0–200)
HDL: 49.9 mg/dL (ref >39.00)
NONHDL: 148.32
Total CHOL/HDL Ratio: 4
Triglycerides: 202 mg/dL — ABNORMAL HIGH (ref 0.0–149.0)
VLDL: 40.4 mg/dL — ABNORMAL HIGH (ref 0.0–40.0)

## 2016-01-06 LAB — TSH: TSH: 2.37 u[IU]/mL (ref 0.35–4.50)

## 2016-01-06 LAB — HEMOGLOBIN A1C: HEMOGLOBIN A1C: 6.5 % (ref 4.6–6.5)

## 2016-01-06 LAB — LDL CHOLESTEROL, DIRECT: LDL DIRECT: 126 mg/dL

## 2016-01-08 ENCOUNTER — Ambulatory Visit (INDEPENDENT_AMBULATORY_CARE_PROVIDER_SITE_OTHER): Payer: Medicare Other | Admitting: Family Medicine

## 2016-01-08 ENCOUNTER — Encounter: Payer: Self-pay | Admitting: Family Medicine

## 2016-01-08 VITALS — BP 155/74 | HR 73 | Temp 97.8°F | Resp 16 | Ht 59.5 in | Wt 147.1 lb

## 2016-01-08 DIAGNOSIS — E119 Type 2 diabetes mellitus without complications: Secondary | ICD-10-CM | POA: Diagnosis not present

## 2016-01-08 DIAGNOSIS — Z0000 Encounter for general adult medical examination without abnormal findings: Secondary | ICD-10-CM

## 2016-01-08 DIAGNOSIS — Z23 Encounter for immunization: Secondary | ICD-10-CM

## 2016-01-08 DIAGNOSIS — E7800 Pure hypercholesterolemia, unspecified: Secondary | ICD-10-CM

## 2016-01-08 DIAGNOSIS — I10 Essential (primary) hypertension: Secondary | ICD-10-CM

## 2016-01-08 DIAGNOSIS — E039 Hypothyroidism, unspecified: Secondary | ICD-10-CM | POA: Diagnosis not present

## 2016-01-08 MED ORDER — AMLODIPINE BESYLATE 5 MG PO TABS: 5.0000 mg | ORAL_TABLET | Freq: Every day | ORAL | 3 refills | Status: DC

## 2016-01-08 MED ORDER — LEVOTHYROXINE SODIUM 125 MCG PO TABS: 125.0000 ug | ORAL_TABLET | Freq: Every day | ORAL | 3 refills | Status: DC

## 2016-01-08 NOTE — Progress Notes (Signed)
Office Note 01/08/2016  CC:  Chief Complaint  Patient presents with  . Annual Exam    CPE    HPI:  Mackenzie Wood is a 70 y.o. White female who is here for f/u DM 2, HTN, hyperlip, hypoth.  Reviewed recent HP labs in detail today.  Feeling well. Cholesterol elevated: pt still declines statin but will notify me if she changes her mind. Most recent cervical ca screening was >3 yrs ago when she was seeing Dr. Ernie Hew at [facility].  Home glucoses: occ check normal. Home bp's: avg high 130s/70s, HR 70s. Compliant with thyroid med daily.  Past Medical History:  Diagnosis Date  . Beta thalassemia, heterozygous   . CAP (community acquired pneumonia) 02/2010  . Congenital sensorineural hearing loss    deaf in right ear; mild presbycusis in left ear (audiologist 2014)  . Depression   . HTN (hypertension)   . Hyperlipidemia, mixed    Refuses statins  . Hypothyroidism   . Osteopenia    DEXAs 2003-2013--pt has declined bisphosphonates  . Somnolence, daytime, controlled 04/22/2012  . Type II or unspecified type diabetes mellitus without mention of complication, uncontrolled   . Vitamin D deficiency     Past Surgical History:  Procedure Laterality Date  . BREAST BIOPSY     Left breast biopsy x 2 (fibrocystic/benign)  . COLONOSCOPY  2008   Normal.  10 yr recall.  . TONSILLECTOMY AND ADENOIDECTOMY     as a child    Family History  Problem Relation Age of Onset  . Hypertension Mother   . Hypertension Father     Social History   Social History  . Marital status: Married    Spouse name: N/A  . Number of children: N/A  . Years of education: N/A   Occupational History  . Not on file.   Social History Main Topics  . Smoking status: Never Smoker  . Smokeless tobacco: Never Used  . Alcohol use No  . Drug use: No  . Sexual activity: Not on file   Other Topics Concern  . Not on file   Social History Narrative   Married, 2 daughters, 3  grandchildren.   Retired Archivist.  Relocated from [place] around 2010.   No T/A/Ds.  Exercise: sedentary life + no formal exercise at all.   Diet: regular                Outpatient Medications Prior to Visit  Medication Sig Dispense Refill  . aspirin EC 81 MG tablet Take 1 tablet (81 mg total) by mouth daily. (Patient taking differently: Take 81 mg by mouth every other day. ) 30 tablet 0  . Calcium Carbonate-Vitamin D (CALCIUM 600+D) 600-400 MG-UNIT per tablet Take 1 tablet by mouth 2 (two) times daily.    Marland Kitchen glucose blood test strip 1 each by Other route daily. 100 each 11  . losartan (COZAAR) 100 MG tablet Take 1 tablet (100 mg total) by mouth daily. 90 tablet 3  . metFORMIN (GLUCOPHAGE) 500 MG tablet 1 tab po bid 180 tablet 3  . Omega-3 Fatty Acids (FISH OIL) 1000 MG CAPS Take 1 capsule by mouth 2 (two) times daily.    Marland Kitchen amLODipine (NORVASC) 5 MG tablet Take 1 tablet (5 mg total) by mouth daily. 90 tablet 3  . levothyroxine (SYNTHROID, LEVOTHROID) 125 MCG tablet Take 1 tablet (125 mcg total) by mouth daily. 90 tablet 3   No facility-administered medications prior to visit.  No Known Allergies  ROS Review of Systems  Constitutional: Negative for appetite change, chills, fatigue and fever.  HENT: Negative for congestion, dental problem, ear pain and sore throat.   Eyes: Negative for discharge, redness and visual disturbance.  Respiratory: Negative for cough, chest tightness, shortness of breath and wheezing.   Cardiovascular: Negative for chest pain, palpitations and leg swelling.  Gastrointestinal: Negative for abdominal pain, blood in stool, diarrhea, nausea and vomiting.  Genitourinary: Negative for difficulty urinating, dysuria, flank pain, frequency, hematuria and urgency.  Musculoskeletal: Negative for arthralgias, back pain, joint swelling, myalgias and neck stiffness.  Skin: Negative for pallor and rash.  Neurological: Negative for dizziness, speech difficulty,  weakness and headaches.  Hematological: Negative for adenopathy. Does not bruise/bleed easily.  Psychiatric/Behavioral: Negative for confusion and sleep disturbance. The patient is not nervous/anxious.     PE; Blood pressure (!) 155/74, pulse 73, temperature 97.8 F (36.6 C), temperature source Oral, resp. rate 16, height 4' 11.5" (1.511 m), weight 147 lb 1.9 oz (66.7 kg), SpO2 99 %.  Pt examined with Starla Link, CMA, as chaperone. Gen: Alert, well appearing.  Patient is oriented to person, place, time, and situation. AFFECT: pleasant, lucid thought and speech. ENT: Ears: EACs clear, normal epithelium.  TMs with good light reflex and landmarks bilaterally.  Eyes: no injection, icteris, swelling, or exudate.  EOMI, PERRLA. Nose: no drainage or turbinate edema/swelling.  No injection or focal lesion.  Mouth: lips without lesion/swelling.  Oral mucosa pink and moist.  Dentition intact and without obvious caries or gingival swelling.  Oropharynx without erythema, exudate, or swelling.  Neck: supple/nontender.  No LAD, mass, or TM.  Carotid pulses 2+ bilaterally, without bruits. CV: RRR, no m/r/g.   LUNGS: CTA bilat, nonlabored resps, good aeration in all lung fields. ABD: soft, NT, ND, BS normal.  No hepatospenomegaly or mass.  No bruits. EXT: no clubbing, cyanosis, or edema.  Musculoskeletal: no joint swelling, erythema, warmth, or tenderness.  ROM of all joints intact. Skin - no sores or suspicious lesions or rashes or color changes  Pertinent labs:  Lab Results  Component Value Date   TSH 2.37 01/06/2016   Lab Results  Component Value Date   WBC 6.2 05/24/2012   HGB 12.5 05/24/2012   HCT 40.1 05/24/2012   MCV 66.1 (L) 05/24/2012   PLT 244.0 05/24/2012   Lab Results  Component Value Date   CREATININE 0.69 01/06/2016   BUN 15 01/06/2016   NA 141 01/06/2016   K 4.8 01/06/2016   CL 103 01/06/2016   CO2 29 01/06/2016   Lab Results  Component Value Date   ALT 14 01/06/2016   AST  17 01/06/2016   ALKPHOS 65 01/06/2016   BILITOT 0.4 01/06/2016   Lab Results  Component Value Date   CHOL 198 01/06/2016   Lab Results  Component Value Date   HDL 49.90 01/06/2016   Lab Results  Component Value Date   LDLCALC 126 (H) 03/03/2014   Lab Results  Component Value Date   TRIG 202.0 (H) 01/06/2016   Lab Results  Component Value Date   CHOLHDL 4 01/06/2016   Lab Results  Component Value Date   HGBA1C 6.5 01/06/2016   ASSESSMENT AND PLAN:   1) DM 2: Hb A1c up some but still at goal. No change in meds at this time.  2) HTN: The current medical regimen is effective;  continue present plan and medications.  3) Hyperlipidemia: she is making fair attempt at dietary  mod but no exercise. She declines statin.  4) Hypothyroidism: TSH normal.  The current medical regimen is effective;  continue present plan and medications.  5) Preventative health care: Flu vaccine today, otherwise UTD on vaccines. Cerv ca screening: she'll return for o/v with me in 4 mo for pelvic exam/pap smear. Colon ca screening: recall will be 2018.  First one done 2008 was in [place], so she'll need new referral in 2018. Breast ca screening: next mammo due 05/2016.  An After Visit Summary was printed and given to the patient.  FOLLOW UP:  Return in about 4 months (around 05/10/2016) for Pelvic exam/pap smear (30 min).  Signed:  Crissie Sickles, MD           01/08/2016

## 2016-01-08 NOTE — Progress Notes (Signed)
Pre visit review using our clinic review tool, if applicable. No additional management support is needed unless otherwise documented below in the visit note. 

## 2016-01-08 NOTE — Addendum Note (Signed)
Addended by: Gordy Councilman on: 01/08/2016 10:10 AM   Modules accepted: Orders

## 2016-01-21 ENCOUNTER — Other Ambulatory Visit: Payer: Self-pay

## 2016-01-21 MED ORDER — LANCETS ULTRA THIN 30G MISC: 11 refills | Status: DC

## 2016-01-28 ENCOUNTER — Other Ambulatory Visit: Payer: Self-pay

## 2016-01-28 MED ORDER — LANCETS ULTRA THIN 30G MISC: 11 refills | Status: DC

## 2016-01-28 NOTE — Telephone Encounter (Signed)
Refill sent to [facility].  

## 2016-02-11 ENCOUNTER — Other Ambulatory Visit: Payer: Self-pay

## 2016-02-11 MED ORDER — LANCETS ULTRA THIN 30G MISC: 11 refills | Status: DC

## 2016-02-11 NOTE — Telephone Encounter (Signed)
Refill sent with icd 10 code.

## 2016-03-02 ENCOUNTER — Other Ambulatory Visit: Payer: Self-pay | Admitting: Family Medicine

## 2016-03-02 ENCOUNTER — Telehealth: Payer: Self-pay | Admitting: Family Medicine

## 2016-03-02 DIAGNOSIS — Z124 Encounter for screening for malignant neoplasm of cervix: Secondary | ICD-10-CM

## 2016-03-02 NOTE — Telephone Encounter (Signed)
[facility] [place]. She can reschedule our appt in February for April or May for follow up of her chronic medical problems.-thx

## 2016-03-02 NOTE — Telephone Encounter (Signed)
Pt advised and voiced understanding.  Call transferred to [place] for rescheduling apt.

## 2016-03-02 NOTE — Telephone Encounter (Signed)
Patient states she has an appt scheduled for 05/13/16 for a follow up for pelvic and pap smear.  She would like to be referred to a GYN for this instead of coming to pcp.   Patient would like to know if she needs to keep appt with Dr. Anitra Lauth on 05/13/16 since she will be seeing GYN for pap.  Does pcp need to see her for something else during this visit other than the pap smear?  Please submit referral to GYN.

## 2016-03-02 NOTE — Telephone Encounter (Signed)
Please advise. Thanks.  

## 2016-03-29 DIAGNOSIS — Z01419 Encounter for gynecological examination (general) (routine) without abnormal findings: Secondary | ICD-10-CM | POA: Diagnosis not present

## 2016-03-29 DIAGNOSIS — Z6829 Body mass index (BMI) 29.0-29.9, adult: Secondary | ICD-10-CM | POA: Diagnosis not present

## 2016-03-29 DIAGNOSIS — Z124 Encounter for screening for malignant neoplasm of cervix: Secondary | ICD-10-CM | POA: Diagnosis not present

## 2016-04-26 ENCOUNTER — Other Ambulatory Visit: Payer: Self-pay | Admitting: Family Medicine

## 2016-04-26 DIAGNOSIS — Z1231 Encounter for screening mammogram for malignant neoplasm of breast: Secondary | ICD-10-CM

## 2016-05-13 ENCOUNTER — Ambulatory Visit: Payer: Medicare Other | Admitting: Family Medicine

## 2016-06-02 DIAGNOSIS — L821 Other seborrheic keratosis: Secondary | ICD-10-CM | POA: Diagnosis not present

## 2016-06-02 DIAGNOSIS — L570 Actinic keratosis: Secondary | ICD-10-CM | POA: Diagnosis not present

## 2016-06-02 DIAGNOSIS — B078 Other viral warts: Secondary | ICD-10-CM | POA: Diagnosis not present

## 2016-06-02 DIAGNOSIS — L814 Other melanin hyperpigmentation: Secondary | ICD-10-CM | POA: Diagnosis not present

## 2016-06-02 DIAGNOSIS — D1801 Hemangioma of skin and subcutaneous tissue: Secondary | ICD-10-CM | POA: Diagnosis not present

## 2016-06-06 ENCOUNTER — Ambulatory Visit
Admission: RE | Admit: 2016-06-06 | Discharge: 2016-06-06 | Disposition: A | Discharge status: Home / Self Care | Payer: Medicare Other | Source: Ambulatory Visit | Attending: Family Medicine | Admitting: Family Medicine

## 2016-06-06 DIAGNOSIS — Z1231 Encounter for screening mammogram for malignant neoplasm of breast: Secondary | ICD-10-CM | POA: Diagnosis not present

## 2016-07-14 ENCOUNTER — Ambulatory Visit (INDEPENDENT_AMBULATORY_CARE_PROVIDER_SITE_OTHER): Payer: Medicare Other | Admitting: Family Medicine

## 2016-07-14 ENCOUNTER — Encounter: Payer: Self-pay | Admitting: Family Medicine

## 2016-07-14 VITALS — BP 127/77 | HR 69 | Temp 98.0°F | Resp 16 | Ht 59.5 in | Wt 146.8 lb

## 2016-07-14 DIAGNOSIS — I10 Essential (primary) hypertension: Secondary | ICD-10-CM | POA: Diagnosis not present

## 2016-07-14 DIAGNOSIS — E039 Hypothyroidism, unspecified: Secondary | ICD-10-CM

## 2016-07-14 DIAGNOSIS — E119 Type 2 diabetes mellitus without complications: Secondary | ICD-10-CM

## 2016-07-14 DIAGNOSIS — Z1211 Encounter for screening for malignant neoplasm of colon: Secondary | ICD-10-CM

## 2016-07-14 DIAGNOSIS — E7800 Pure hypercholesterolemia, unspecified: Secondary | ICD-10-CM | POA: Diagnosis not present

## 2016-07-14 LAB — BASIC METABOLIC PANEL
BUN: 17 mg/dL (ref 6–23)
CALCIUM: 9.5 mg/dL (ref 8.4–10.5)
CO2: 28 meq/L (ref 19–32)
Chloride: 105 mEq/L (ref 96–112)
Creatinine, Ser: 0.7 mg/dL (ref 0.40–1.20)
GFR: 87.18 mL/min (ref >60.00)
GLUCOSE: 108 mg/dL — AB (ref 70–99)
Potassium: 4.6 mEq/L (ref 3.5–5.1)
Sodium: 140 mEq/L (ref 135–145)

## 2016-07-14 LAB — TSH: TSH: 1.64 u[IU]/mL (ref 0.35–4.50)

## 2016-07-14 LAB — HEMOGLOBIN A1C: Hgb A1c MFr Bld: 6.7 % — ABNORMAL HIGH (ref 4.6–6.5)

## 2016-07-14 NOTE — Progress Notes (Signed)
OFFICE VISIT  07/14/2016   CC:  Chief Complaint  Patient presents with  . Follow-up    RCI, pt is fasting.    HPI:    Patient is a 71 y.o. [name] who presents for 6 mo f/u DM 2, HTN, Hyperlipidemia, hypothyroidism. Recent pap/pelvic at GYN was normal per pt.  DM: fasting gluc checks daily--avg 123-125.   Tries to eat diabetic diet. Exercise: Sometimes walking, gardening.  Not sedentary.  HTN: home monitoring: avg 130s/70s, HR avg 70s. Pt wants to make no changes in meds today.  Thyroid: feels like she's been tired, feels like wt creeping up easier.  HLD: historically has declined statin.  Past Medical History:  Diagnosis Date  . Beta thalassemia, heterozygous   . CAP (community acquired pneumonia) 02/2010  . Congenital sensorineural hearing loss    deaf in right ear; mild presbycusis in left ear (audiologist 2014)  . Depression   . HTN (hypertension)   . Hyperlipidemia, mixed    Refuses statins  . Hypothyroidism   . Osteopenia    DEXAs 2003-2013--pt has declined bisphosphonates  . Somnolence, daytime, controlled 04/22/2012  . Type II or unspecified type diabetes mellitus without mention of complication, uncontrolled   . Vitamin D deficiency     Past Surgical History:  Procedure Laterality Date  . BREAST BIOPSY     Left breast biopsy x 2 (fibrocystic/benign)  . COLONOSCOPY  2008   Normal.  10 yr recall (initial colonoscopy done in [place]--needs referral to local GI in 2018.  . TONSILLECTOMY AND ADENOIDECTOMY     as a child    Outpatient Medications Prior to Visit  Medication Sig Dispense Refill  . amLODipine (NORVASC) 5 MG tablet Take 1 tablet (5 mg total) by mouth daily. 90 tablet 3  . aspirin EC 81 MG tablet Take 1 tablet (81 mg total) by mouth daily. (Patient taking differently: Take 81 mg by mouth every other day. ) 30 tablet 0  . Calcium Carbonate-Vitamin D (CALCIUM 600+D) 600-400 MG-UNIT per tablet Take 1 tablet by mouth 2 (two) times  daily.    Marland Kitchen glucose blood test strip 1 each by Other route daily. 100 each 11  . LANCETS ULTRA THIN 30G MISC Use as directed - check blood sugar once daily 100 each 11  . levothyroxine (SYNTHROID, LEVOTHROID) 125 MCG tablet Take 1 tablet (125 mcg total) by mouth daily. 90 tablet 3  . losartan (COZAAR) 100 MG tablet Take 1 tablet (100 mg total) by mouth daily. 90 tablet 3  . metFORMIN (GLUCOPHAGE) 500 MG tablet 1 tab po bid 180 tablet 3  . Omega-3 Fatty Acids (FISH OIL) 1000 MG CAPS Take 1 capsule by mouth 2 (two) times daily.     No facility-administered medications prior to visit.     No Known Allergies  ROS As per HPI  PE: Blood pressure 127/77, pulse 69, temperature 98 F (36.7 C), temperature source Oral, resp. rate 16, height 4' 11.5" (1.511 m), weight 146 lb 12 oz (66.6 kg), SpO2 97 %. Gen: Alert, well appearing.  Patient is oriented to person, place, time, and situation. AFFECT: pleasant, lucid thought and speech. No further exam today.  LABS:  Lab Results  Component Value Date   TSH 2.37 01/06/2016   Lab Results  Component Value Date   WBC 6.2 05/24/2012   HGB 12.5 05/24/2012   HCT 40.1 05/24/2012   MCV 66.1 (L) 05/24/2012   PLT 244.0 05/24/2012   Lab Results  Component Value  Date   CREATININE 0.69 01/06/2016   BUN 15 01/06/2016   NA 141 01/06/2016   K 4.8 01/06/2016   CL 103 01/06/2016   CO2 29 01/06/2016   Lab Results  Component Value Date   ALT 14 01/06/2016   AST 17 01/06/2016   ALKPHOS 65 01/06/2016   BILITOT 0.4 01/06/2016   Lab Results  Component Value Date   CHOL 198 01/06/2016   Lab Results  Component Value Date   HDL 49.90 01/06/2016   Lab Results  Component Value Date   LDLCALC 126 (H) 03/03/2014   Lab Results  Component Value Date   TRIG 202.0 (H) 01/06/2016   Lab Results  Component Value Date   CHOLHDL 4 01/06/2016   Lab Results  Component Value Date   HGBA1C 6.5 01/06/2016   IMPRESSION AND PLAN:  1) DM 2, historically  stable. Due for diab retpthy eye exam. Check A1c today.  2) HTN: The current medical regimen is effective;  continue present plan and medications. Technically, by new guidelines she is not ideally controlled but she declines any change/additions in meds at this time.  3) HLD: declines statin.  Continue to work on [place].  4) Hypothyroidism: recheck TSH today, pt concerned about some fatigue and more trouble keeping her wt stable lately. Adjust med to get TSH in lower normal range if possible.  5) Colon cancer screening: she declines any further colon cancer screening at this time.  An After Visit Summary was printed and given to the patient.  FOLLOW UP: Return in about 6 months (around 01/13/2017) for routine chronic illness f/u.  Signed:  Crissie Sickles, MD           07/14/2016

## 2016-07-14 NOTE — Progress Notes (Signed)
Pre visit review using our clinic review tool, if applicable. No additional management support is needed unless otherwise documented below in the visit note. 

## 2016-08-03 ENCOUNTER — Other Ambulatory Visit: Payer: Self-pay | Admitting: *Deleted

## 2016-08-03 MED ORDER — LOSARTAN POTASSIUM 100 MG PO TABS: 100.0000 mg | ORAL_TABLET | Freq: Every day | ORAL | 1 refills | Status: DC

## 2016-08-03 NOTE — Telephone Encounter (Signed)
Rx sent.  Pt advised and voiced understanding.   

## 2016-08-03 NOTE — Telephone Encounter (Signed)
Pt LMOM on 08/03/16 at 9:29am requesting refill for losartan be sent to [facility].   RF request for losartan LOV: 07/14/16 Next ov: 01/13/17 Last written: 08/20/15 #90 w/ 3RF

## 2016-08-10 ENCOUNTER — Encounter: Payer: Self-pay | Admitting: Family Medicine

## 2016-08-10 DIAGNOSIS — H2513 Age-related nuclear cataract, bilateral: Secondary | ICD-10-CM | POA: Diagnosis not present

## 2016-08-10 DIAGNOSIS — H35033 Hypertensive retinopathy, bilateral: Secondary | ICD-10-CM | POA: Diagnosis not present

## 2016-08-10 DIAGNOSIS — E119 Type 2 diabetes mellitus without complications: Secondary | ICD-10-CM | POA: Diagnosis not present

## 2016-08-10 LAB — HM DIABETES EYE EXAM

## 2016-08-15 ENCOUNTER — Telehealth: Payer: Self-pay | Admitting: *Deleted

## 2016-08-15 NOTE — Telephone Encounter (Signed)
Pt advised and voiced understanding.   

## 2016-08-15 NOTE — Telephone Encounter (Signed)
Monistat

## 2016-08-15 NOTE — Telephone Encounter (Signed)
Pt LMOM on 08/15/16 at 9:28am stating that she has had some vaginal itching and wants to know if there is something otc she can use. Please advise. Thanks.

## 2016-11-15 ENCOUNTER — Other Ambulatory Visit: Payer: Self-pay | Admitting: Family Medicine

## 2016-12-13 ENCOUNTER — Other Ambulatory Visit: Payer: Self-pay | Admitting: Family Medicine

## 2016-12-28 ENCOUNTER — Other Ambulatory Visit: Payer: Self-pay | Admitting: Family Medicine

## 2016-12-28 DIAGNOSIS — E119 Type 2 diabetes mellitus without complications: Secondary | ICD-10-CM

## 2017-01-13 ENCOUNTER — Encounter: Payer: Self-pay | Admitting: Family Medicine

## 2017-01-13 ENCOUNTER — Ambulatory Visit (INDEPENDENT_AMBULATORY_CARE_PROVIDER_SITE_OTHER): Payer: Medicare Other | Admitting: Family Medicine

## 2017-01-13 VITALS — BP 139/74 | HR 78 | Temp 98.0°F | Resp 16 | Ht 59.5 in | Wt 148.2 lb

## 2017-01-13 DIAGNOSIS — E119 Type 2 diabetes mellitus without complications: Secondary | ICD-10-CM

## 2017-01-13 DIAGNOSIS — E039 Hypothyroidism, unspecified: Secondary | ICD-10-CM

## 2017-01-13 DIAGNOSIS — Z23 Encounter for immunization: Secondary | ICD-10-CM

## 2017-01-13 DIAGNOSIS — R718 Other abnormality of red blood cells: Secondary | ICD-10-CM | POA: Diagnosis not present

## 2017-01-13 DIAGNOSIS — I10 Essential (primary) hypertension: Secondary | ICD-10-CM | POA: Diagnosis not present

## 2017-01-13 DIAGNOSIS — Z0000 Encounter for general adult medical examination without abnormal findings: Secondary | ICD-10-CM | POA: Diagnosis not present

## 2017-01-13 LAB — COMPREHENSIVE METABOLIC PANEL
ALBUMIN: 4.5 g/dL (ref 3.5–5.2)
ALK PHOS: 64 U/L (ref 39–117)
ALT: 19 U/L (ref 0–35)
AST: 16 U/L (ref 0–37)
BILIRUBIN TOTAL: 0.4 mg/dL (ref 0.2–1.2)
BUN: 21 mg/dL (ref 6–23)
CO2: 30 mEq/L (ref 19–32)
CREATININE: 0.76 mg/dL (ref 0.40–1.20)
Calcium: 9.7 mg/dL (ref 8.4–10.5)
Chloride: 97 mEq/L (ref 96–112)
GFR: 79.18 mL/min (ref >60.00)
GLUCOSE: 174 mg/dL — AB (ref 70–99)
POTASSIUM: 4.3 meq/L (ref 3.5–5.1)
Sodium: 137 mEq/L (ref 135–145)
TOTAL PROTEIN: 6.8 g/dL (ref 6.0–8.3)

## 2017-01-13 LAB — CBC WITH DIFFERENTIAL/PLATELET
BASOS ABS: 0 10*3/uL (ref 0.0–0.1)
BASOS PCT: 0.6 % (ref 0.0–3.0)
EOS ABS: 0.5 10*3/uL (ref 0.0–0.7)
Eosinophils Relative: 7.2 % — ABNORMAL HIGH (ref 0.0–5.0)
HEMATOCRIT: 38.5 % (ref 36.0–46.0)
HEMOGLOBIN: 11.8 g/dL — AB (ref 12.0–15.0)
LYMPHS PCT: 29.7 % (ref 12.0–46.0)
Lymphs Abs: 1.9 10*3/uL (ref 0.7–4.0)
MCHC: 30.7 g/dL (ref 30.0–36.0)
MONOS PCT: 5.5 % (ref 3.0–12.0)
Monocytes Absolute: 0.4 10*3/uL (ref 0.1–1.0)
NEUTROS ABS: 3.7 10*3/uL (ref 1.4–7.7)
Neutrophils Relative %: 57 % (ref 43.0–77.0)
PLATELETS: 282 10*3/uL (ref 150.0–400.0)
RBC: 5.81 Mil/uL — ABNORMAL HIGH (ref 3.87–5.11)
RDW: 15.3 % (ref 11.5–15.5)
WBC: 6.6 10*3/uL (ref 4.0–10.5)

## 2017-01-13 LAB — HEMOGLOBIN A1C: Hgb A1c MFr Bld: 6.8 % — ABNORMAL HIGH (ref 4.6–6.5)

## 2017-01-13 LAB — TSH: TSH: 1.86 u[IU]/mL (ref 0.35–4.50)

## 2017-01-13 NOTE — Progress Notes (Signed)
AWV reviewed and agree.  Signed:  Crissie Sickles, MD           01/13/2017

## 2017-01-13 NOTE — Patient Instructions (Addendum)

## 2017-01-13 NOTE — Progress Notes (Signed)
Subjective:   Mackenzie Wood is a 70 y.o. female who presents for Medicare Annual (Subsequent) preventive examination.  Review of Systems:  No ROS.  Medicare Wellness Visit. Additional risk factors are reflected in the social history.  Cardiac Risk Factors include: advanced age (>68men, >91 women);diabetes mellitus;dyslipidemia;hypertension   Sleep patterns: Sleeps 8-10 hours.  Home Safety/Smoke Alarms: Feels safe in home. Smoke alarms in place.  Living environment; residence and Firearm Safety: Lives with husband in 1 story.  Seat Belt Safety/Bike Helmet: Wears seat belt.   Female:   Pap-NA    Mammo-06/06/2016, negative.        Dexa scan-05/09/2011, osteopenia. Declines testing.      [id number] in [place]. Recall 10 years. Declines testing.      Objective:     Vitals: BP 139/74 (BP Location: Left Arm, Patient Position: Sitting, Cuff Size: Normal)   Pulse 78   Temp 98 F (36.7 C) (Oral)   Resp 16   Ht 4' 11.5" (1.511 m)   Wt 148 lb 4 oz (67.2 kg)   SpO2 98%   BMI 29.44 kg/m   Body mass index is 29.44 kg/m.   Tobacco History  Smoking Status  . Never Smoker  Smokeless Tobacco  . Never Used     Counseling given: Not Answered   Past Medical History:  Diagnosis Date  . Beta thalassemia, heterozygous   . CAP (community acquired pneumonia) 02/2010  . Colon cancer screening    As of 06/2016, she declines any further colon cancer screening.  . Congenital sensorineural hearing loss    deaf in right ear; mild presbycusis in left ear (audiologist 2014)  . Depression   . HTN (hypertension)   . Hyperlipidemia, mixed    Refuses statins  . Hypothyroidism   . Osteopenia    DEXAs 2003-2013--pt has declined bisphosphonates  . Somnolence, daytime, controlled 04/22/2012  . Type II or unspecified type diabetes mellitus without mention of complication, uncontrolled   . Vitamin D deficiency    Past Surgical History:  Procedure Laterality Date  . BREAST BIOPSY     Left breast biopsy x 2 (fibrocystic/benign)  . COLONOSCOPY  2008   Normal.  10 yr recall (initial colonoscopy done in [place]--needs referral to local GI in 2018.  . TONSILLECTOMY AND ADENOIDECTOMY     as a child   Family History  Problem Relation Age of Onset  . Hypertension Mother   . Hypertension Father    History  Sexual Activity  . Sexual activity: Not on file    Outpatient Encounter Prescriptions as of 01/13/2017  Medication Sig  . amLODipine (NORVASC) 5 MG tablet TAKE 1 TABLET DAILY.  Marland Kitchen aspirin EC 81 MG tablet Take 1 tablet (81 mg total) by mouth daily. (Patient taking differently: Take 81 mg by mouth every other day. )  . Calcium Carbonate-Vitamin D (CALCIUM 600+D) 600-400 MG-UNIT per tablet Take 1 tablet by mouth 2 (two) times daily.  Marland Kitchen LANCETS ULTRA THIN 30G MISC Use as directed - check blood sugar once daily  . levothyroxine (SYNTHROID, LEVOTHROID) 125 MCG tablet Take 1 tablet (125 mcg total) by mouth daily.  Marland Kitchen losartan (COZAAR) 100 MG tablet Take 1 tablet (100 mg total) by mouth daily.  . metFORMIN (GLUCOPHAGE) 500 MG tablet TAKE 1 TABLET TWICE DAILY  . Omega-3 Fatty Acids (FISH OIL) 1000 MG CAPS Take 1 capsule by mouth 2 (two) times daily.  . ONE TOUCH ULTRA TEST test strip TEST 1 EACH BY OTHER ROUTE DAILY.  DIAG E11.9   No facility-administered encounter medications on file as of 01/13/2017.     Activities of Daily Living In your present state of health, do you have any difficulty performing the following activities: 01/13/2017  Hearing? N  Vision? N  Difficulty concentrating or making decisions? N  Walking or climbing stairs? N  Dressing or bathing? N  Doing errands, shopping? N  Preparing Food and eating ? N  Using the Toilet? N  In the past six months, have you accidently leaked urine? N  Do you have problems with loss of bowel control? N  Managing your Medications? N  Managing your Finances? N  Housekeeping or managing your Housekeeping? N  Some  recent data might be hidden    Patient Care Team: Tammi Sou, MD as PCP - General (Family Medicine) Melida Quitter, MD as Consulting Physician (Otolaryngology)    Assessment:    Physical assessment deferred to PCP.  Exercise Activities and Dietary recommendations Current Exercise Habits: The patient does not participate in regular exercise at present (gardens; housework), Exercise limited by: None identified   Diet (meal preparation, eat out, water intake, caffeinated beverages, dairy products, fruits and vegetables): Drinks coffee and unsweet tea.   Breakfast: toast, pb; eggs; oatmeal Lunch: protein/veggies; cheese crackers Dinner: protein/veggies; salad  Goals    . Weight (lb) < 140 lb (63.5 kg)          Lose 5-8 pounds.       Fall Risk Fall Risk  01/13/2017 01/13/2017 01/08/2016 09/08/2015 09/02/2014  Falls in the past year? No No No No No   Depression Screen PHQ 2/9 Scores 01/13/2017 01/13/2017 01/08/2016 09/08/2015  PHQ - 2 Score - - 0 0  Exception Documentation Patient refusal Patient refusal - -     Cognitive Function       Ad8 score reviewed for issues:  Issues making decisions: no  Less interest in hobbies / activities: no  Repeats questions, stories (family complaining): no  Trouble using ordinary gadgets (microwave, computer, phone): no  Forgets the month or year: no  Mismanaging finances: no  Remembering appts: no  Daily problems with thinking and/or memory: no Ad8 score is=0     Immunization History  Administered Date(s) Administered  . Influenza Split 12/27/2011  . Influenza, High Dose Seasonal PF 01/02/2015, 01/08/2016  . Influenza,inj,Quad PF,6+ Mos 03/04/2013, 03/03/2014  . Pneumococcal Conjugate-13 09/08/2015  . Pneumococcal Polysaccharide-23 07/07/2014   Screening Tests Health Maintenance  Topic Date Due  . COLONOSCOPY  04/22/2016  . INFLUENZA VACCINE  10/26/2016  . HEMOGLOBIN A1C  01/13/2017  . TETANUS/TDAP  03/28/2018  (Originally 03/28/2016)  . Hepatitis C Screening  12/27/2018 (Originally November 21, 1945)  . MAMMOGRAM  06/06/2017  . OPHTHALMOLOGY EXAM  08/10/2017  . FOOT EXAM  01/13/2018  . DEXA SCAN  Completed  . PNA vac Low Risk Adult  Completed      Plan:    Bring a copy of your living will and/or healthcare power of attorney to your next office visit.  Continue doing brain stimulating activities (puzzles, reading, adult coloring books, staying active) to keep memory sharp.   I have personally reviewed and noted the following in the patient's chart:   . Medical and social history . Use of alcohol, tobacco or illicit drugs  . Current medications and supplements . Functional ability and status . Nutritional status . Physical activity . Advanced directives . List of other physicians . Hospitalizations, surgeries, and ER visits in previous 12 months .  Vitals . Screenings to include cognitive, depression, and falls . Referrals and appointments  In addition, I have reviewed and discussed with patient certain preventive protocols, quality metrics, and best practice recommendations. A written personalized care plan for preventive services as well as general preventive health recommendations were provided to patient.     Gerilyn Nestle, RN  01/13/2017  PCP Notes: -Declines DEXA -Declines colon ca screening

## 2017-01-13 NOTE — Progress Notes (Signed)
OFFICE VISIT  01/13/2017   CC:  Chief Complaint  Patient presents with  . Follow-up    RCI, pt is not fasting.   . Medicare Wellness     HPI:    Patient is a 71 y.o. [name] who presents for 6 mo f/u DM 2, HTN, Hyperlipidemia, and hypothyroidism.  DM: home monitoring 120s fasting avg. No pain, tingling, or numbness in feet.  HTN: home bp's 130/80 or better consistently.  HLD: not making any big changes, avoids eating out.  Tries to eat reasonably.  Exercise: walking about 2 times a week in warm weather, does yard and house work.  Hypothyr: takes levothyr fasting qAM with her calcium and fish oil.     Past Medical History:  Diagnosis Date  . Beta thalassemia, heterozygous   . CAP (community acquired pneumonia) 02/2010  . Colon cancer screening    As of 06/2016, she declines any further colon cancer screening.  . Congenital sensorineural hearing loss    deaf in right ear; mild presbycusis in left ear (audiologist 2014)  . Depression   . HTN (hypertension)   . Hyperlipidemia, mixed    Refuses statins  . Hypothyroidism   . Osteopenia    DEXAs 2003-2013--pt has declined bisphosphonates  . Somnolence, daytime, controlled 04/22/2012  . Type II or unspecified type diabetes mellitus without mention of complication, uncontrolled   . Vitamin D deficiency     Past Surgical History:  Procedure Laterality Date  . BREAST BIOPSY     Left breast biopsy x 2 (fibrocystic/benign)  . COLONOSCOPY  2008   Normal.  10 yr recall (initial colonoscopy done in [place]--needs referral to local GI in 2018.  . TONSILLECTOMY AND ADENOIDECTOMY     as a child    Outpatient Medications Prior to Visit  Medication Sig Dispense Refill  . amLODipine (NORVASC) 5 MG tablet TAKE 1 TABLET DAILY. 90 tablet 1  . aspirin EC 81 MG tablet Take 1 tablet (81 mg total) by mouth daily. (Patient taking differently: Take 81 mg by mouth every other day. ) 30 tablet 0  . Calcium Carbonate-Vitamin D  (CALCIUM 600+D) 600-400 MG-UNIT per tablet Take 1 tablet by mouth 2 (two) times daily.    Marland Kitchen LANCETS ULTRA THIN 30G MISC Use as directed - check blood sugar once daily 100 each 11  . levothyroxine (SYNTHROID, LEVOTHROID) 125 MCG tablet Take 1 tablet (125 mcg total) by mouth daily. 90 tablet 3  . losartan (COZAAR) 100 MG tablet Take 1 tablet (100 mg total) by mouth daily. 90 tablet 1  . metFORMIN (GLUCOPHAGE) 500 MG tablet TAKE 1 TABLET TWICE DAILY 180 tablet 0  . Omega-3 Fatty Acids (FISH OIL) 1000 MG CAPS Take 1 capsule by mouth 2 (two) times daily.    . ONE TOUCH ULTRA TEST test strip TEST 1 EACH BY OTHER ROUTE DAILY. DIAG E11.9 100 each 3   No facility-administered medications prior to visit.     No Known Allergies  ROS As per HPI  PE: Blood pressure 139/74, pulse 78, temperature 98 F (36.7 C), temperature source Oral, resp. rate 16, height 4' 11.5" (1.511 m), weight 148 lb 4 oz (67.2 kg), SpO2 98 %. Gen: Alert, well appearing.  Patient is oriented to person, place, time, and situation. AFFECT: pleasant, lucid thought and speech. CV: RRR, no m/r/g.   LUNGS: CTA bilat, nonlabored resps, good aeration in all lung fields. EXT: no clubbing, cyanosis, or edema.  Foot exam - bilateral normal; no  swelling, tenderness or skin or vascular lesions. Color and temperature is normal. Sensation is intact. Peripheral pulses are palpable. Toenails are normal.   LABS:  Lab Results  Component Value Date   TSH 1.64 07/14/2016   Lab Results  Component Value Date   WBC 6.2 05/24/2012   HGB 12.5 05/24/2012   HCT 40.1 05/24/2012   MCV 66.1 (L) 05/24/2012   PLT 244.0 05/24/2012   Lab Results  Component Value Date   IRON 137 05/24/2012   TIBC 375 05/24/2012    Lab Results  Component Value Date   CREATININE 0.70 07/14/2016   BUN 17 07/14/2016   NA 140 07/14/2016   K 4.6 07/14/2016   CL 105 07/14/2016   CO2 28 07/14/2016   Lab Results  Component Value Date   ALT 14 01/06/2016   AST  17 01/06/2016   ALKPHOS 65 01/06/2016   BILITOT 0.4 01/06/2016   Lab Results  Component Value Date   CHOL 198 01/06/2016   Lab Results  Component Value Date   HDL 49.90 01/06/2016   Lab Results  Component Value Date   LDLCALC 126 (H) 03/03/2014   Lab Results  Component Value Date   TRIG 202.0 (H) 01/06/2016   Lab Results  Component Value Date   CHOLHDL 4 01/06/2016   Lab Results  Component Value Date   HGBA1C 6.7 (H) 07/14/2016    IMPRESSION AND PLAN:  1) DM 2, good control. Feet exam normal today. Flu vaccine today. HbA1c today.  2) HTN: The current medical regimen is effective;  continue present plan and medications. Lytes/cr today.  3) HLD: still trying to work on [place].  Declines statin.    4) Hypothyroidism: TSH monitoring today.  5) Microcytosis, w/out anemia.  On labs back in 2014.  Iron and TIBC were normal but ferritin never got checked. Recheck CBC today.  An After Visit Summary was printed and given to the patient.  FOLLOW UP: Return in about 6 months (around 07/14/2017).  Signed:  Crissie Sickles, MD           01/13/2017

## 2017-01-19 ENCOUNTER — Telehealth: Payer: Self-pay | Admitting: Family Medicine

## 2017-01-19 ENCOUNTER — Encounter: Payer: Self-pay | Admitting: Family Medicine

## 2017-01-19 NOTE — Telephone Encounter (Signed)
Please advise. Thanks.  

## 2017-01-19 NOTE — Telephone Encounter (Signed)
Patient requesting call back with lab results done Monday.

## 2017-01-19 NOTE — Telephone Encounter (Signed)
Will call pt and discuss lab results.

## 2017-02-13 ENCOUNTER — Other Ambulatory Visit: Payer: Self-pay | Admitting: Family Medicine

## 2017-03-01 ENCOUNTER — Other Ambulatory Visit: Payer: Self-pay | Admitting: Family Medicine

## 2017-03-01 DIAGNOSIS — E119 Type 2 diabetes mellitus without complications: Secondary | ICD-10-CM

## 2017-04-11 ENCOUNTER — Other Ambulatory Visit: Payer: Self-pay | Admitting: Family Medicine

## 2017-05-16 DIAGNOSIS — H903 Sensorineural hearing loss, bilateral: Secondary | ICD-10-CM | POA: Diagnosis not present

## 2017-05-22 DIAGNOSIS — H6122 Impacted cerumen, left ear: Secondary | ICD-10-CM | POA: Diagnosis not present

## 2017-05-22 DIAGNOSIS — H903 Sensorineural hearing loss, bilateral: Secondary | ICD-10-CM | POA: Insufficient documentation

## 2017-05-22 DIAGNOSIS — H905 Unspecified sensorineural hearing loss: Secondary | ICD-10-CM | POA: Insufficient documentation

## 2017-06-29 ENCOUNTER — Telehealth: Payer: Self-pay | Admitting: Family Medicine

## 2017-06-29 NOTE — Telephone Encounter (Signed)
Pt called back to discuss medications. States she will discuss at next office visit which is on the 07/17/17.  Aware PCP will call in any new medications to a local pharmacy while waiting for mail order RX's.

## 2017-06-29 NOTE — Telephone Encounter (Signed)
Left message to call back to discuss medication concerns.

## 2017-06-29 NOTE — Telephone Encounter (Signed)
Copied from [place]. Topic: Quick Communication - See Telephone Encounter >> Jun 29, 2017 10:42 AM Boyd Kerbs wrote: CRM for notification.   Pt. Said Dr. Anitra Lauth was going to change losartan (COZAAR) 100 MG tablet at next visit. But was asking about the amLODipine (NORVASC) 5 MG tablet. She has enough of both to last until appt.  She uses [facility] order and is not sure if should refill Amlodipine or if he wants to put in a new prescription for a different medication.   Has appt 4/22 but it takes 2 weeks to get medication from [facility] mail order.   Please call pt.   See Telephone encounter for: 06/29/17.

## 2017-07-17 ENCOUNTER — Encounter: Payer: Self-pay | Admitting: Family Medicine

## 2017-07-17 ENCOUNTER — Ambulatory Visit (INDEPENDENT_AMBULATORY_CARE_PROVIDER_SITE_OTHER): Payer: Medicare Other | Admitting: Family Medicine

## 2017-07-17 VITALS — BP 147/75 | HR 72 | Temp 97.2°F | Ht 59.5 in | Wt 149.6 lb

## 2017-07-17 DIAGNOSIS — E875 Hyperkalemia: Secondary | ICD-10-CM

## 2017-07-17 DIAGNOSIS — R5382 Chronic fatigue, unspecified: Secondary | ICD-10-CM

## 2017-07-17 DIAGNOSIS — E119 Type 2 diabetes mellitus without complications: Secondary | ICD-10-CM

## 2017-07-17 DIAGNOSIS — R42 Dizziness and giddiness: Secondary | ICD-10-CM

## 2017-07-17 DIAGNOSIS — I10 Essential (primary) hypertension: Secondary | ICD-10-CM

## 2017-07-17 HISTORY — DX: Hyperkalemia: E87.5

## 2017-07-17 LAB — COMPREHENSIVE METABOLIC PANEL
ALT: 18 U/L (ref 0–35)
AST: 15 U/L (ref 0–37)
Albumin: 4.4 g/dL (ref 3.5–5.2)
Alkaline Phosphatase: 62 U/L (ref 39–117)
BILIRUBIN TOTAL: 0.3 mg/dL (ref 0.2–1.2)
BUN: 16 mg/dL (ref 6–23)
CALCIUM: 9.5 mg/dL (ref 8.4–10.5)
CHLORIDE: 103 meq/L (ref 96–112)
CO2: 28 meq/L (ref 19–32)
CREATININE: 0.63 mg/dL (ref 0.40–1.20)
GFR: 98.18 mL/min (ref >60.00)
Glucose, Bld: 105 mg/dL — ABNORMAL HIGH (ref 70–99)
Potassium: 5.6 mEq/L — ABNORMAL HIGH (ref 3.5–5.1)
SODIUM: 141 meq/L (ref 135–145)
Total Protein: 6.6 g/dL (ref 6.0–8.3)

## 2017-07-17 LAB — CBC WITH DIFFERENTIAL/PLATELET
BASOS ABS: 0 10*3/uL (ref 0.0–0.1)
BASOS PCT: 0.7 % (ref 0.0–3.0)
Eosinophils Absolute: 0.2 10*3/uL (ref 0.0–0.7)
Eosinophils Relative: 2.9 % (ref 0.0–5.0)
HCT: 37.6 % (ref 36.0–46.0)
Hemoglobin: 11.8 g/dL — ABNORMAL LOW (ref 12.0–15.0)
LYMPHS ABS: 1.8 10*3/uL (ref 0.7–4.0)
LYMPHS PCT: 34.2 % (ref 12.0–46.0)
MCHC: 31.4 g/dL (ref 30.0–36.0)
MCV: 65.4 fl — ABNORMAL LOW (ref 78.0–100.0)
MONO ABS: 0.5 10*3/uL (ref 0.1–1.0)
Monocytes Relative: 8.8 % (ref 3.0–12.0)
NEUTROS ABS: 2.8 10*3/uL (ref 1.4–7.7)
NEUTROS PCT: 53.4 % (ref 43.0–77.0)
Platelets: 250 10*3/uL (ref 150.0–400.0)
RBC: 5.74 Mil/uL — ABNORMAL HIGH (ref 3.87–5.11)
RDW: 15.6 % — AB (ref 11.5–15.5)
WBC: 5.2 10*3/uL (ref 4.0–10.5)

## 2017-07-17 LAB — HEMOGLOBIN A1C: Hgb A1c MFr Bld: 6.9 % — ABNORMAL HIGH (ref 4.6–6.5)

## 2017-07-17 MED ORDER — TELMISARTAN 80 MG PO TABS: 80.0000 mg | ORAL_TABLET | Freq: Every day | ORAL | 0 refills | Status: DC

## 2017-07-17 MED ORDER — AMLODIPINE BESYLATE 5 MG PO TABS: 5.0000 mg | ORAL_TABLET | Freq: Every day | ORAL | 1 refills | Status: DC

## 2017-07-17 NOTE — Addendum Note (Signed)
Addended by: Onalee Hua on: 07/17/2017 11:55 AM   Modules accepted: Orders

## 2017-07-17 NOTE — Progress Notes (Signed)
OFFICE VISIT  07/17/2017   CC:  Chief Complaint  Patient presents with  . Follow-up    RCI   HPI:    Patient is a 72 y.o. [name] who presents for 6 mo f/u DM 2, HTN.  Exercise: walking around home, getting in garden some.  Not sedentary. Time to start walking again, though.  HTN: only occ bp check shows 329J systolic, 18A diastolic.  However, after episode of vertigo with n/v yesterday b/p was 416S systolic. Compliant with meds.  DM: no home glucose monitoring. Diet fair.  New complaint: > 6-7 mo of fatigue.  Excessive daytime somnolence ++.  "I can sleep anytime I want".  She snores.  Husband has never commented on any apneic events during sleep. Has lifelong habit of more need for sleep but it has been worse the last 6 mo.  No correlation with any meds we've put her on.  Describes good sleep hygiene except daily naps. Deals with chronic low level depression and anxiety, ? Worse this winter?.  ROS: random brief wave of dizzy spells.  Two distinct episodes of vertigo with n/v.  No polyuria or polydipsia. No CP, SOB, left arm sx's, jaw pain, or LE swelling.  No HAs.  No falls.  Past Medical History:  Diagnosis Date  . Beta thalassemia, heterozygous    vs alpha thalassemia minor (pt's family originally from [place]).  Marland Kitchen CAP (community acquired pneumonia) 02/2010  . Colon cancer screening    As of 06/2016, she declines any further colon cancer screening.  . Congenital sensorineural hearing loss    deaf in right ear; mild presbycusis in left ear (audiologist 2014)  . Depression   . HTN (hypertension)   . Hyperlipidemia, mixed    Refuses statins  . Hypothyroidism   . Osteopenia    DEXAs 2003-2013--pt has declined bisphosphonates  . Somnolence, daytime, controlled 04/22/2012  . Type II or unspecified type diabetes mellitus without mention of complication, uncontrolled   . Vitamin D deficiency     Past Surgical History:  Procedure Laterality Date  . BREAST BIOPSY      Left breast biopsy x 2 (fibrocystic/benign)  . COLONOSCOPY  2008   Normal.  10 yr recall (initial colonoscopy done in [place]--needs referral to local GI in 2018.  . TONSILLECTOMY AND ADENOIDECTOMY     as a child    Outpatient Medications Prior to Visit  Medication Sig Dispense Refill  . aspirin EC 81 MG tablet Take 1 tablet (81 mg total) by mouth daily. (Patient taking differently: Take 81 mg by mouth every other day. ) 30 tablet 0  . Calcium Carbonate-Vitamin D (CALCIUM 600+D) 600-400 MG-UNIT per tablet Take 1 tablet by mouth 2 (two) times daily.    . [facility] LANCETS ULTRA-THIN 30G MISC USE AS DIRECTED - CHECK BLOOD SUGAR ONCE DAILY. ICD 10: E11.9 100 each 2  . levothyroxine (SYNTHROID, LEVOTHROID) 125 MCG tablet TAKE 1 TABLET DAILY. 90 tablet 1  . metFORMIN (GLUCOPHAGE) 500 MG tablet TAKE 1 TABLET TWICE DAILY 180 tablet 1  . Omega-3 Fatty Acids (FISH OIL) 1000 MG CAPS Take 1 capsule by mouth 2 (two) times daily.    . ONE TOUCH ULTRA TEST test strip TEST 1 EACH BY OTHER ROUTE DAILY. DIAG E11.9 100 each 3  . amLODipine (NORVASC) 5 MG tablet TAKE 1 TABLET DAILY. 90 tablet 1  . losartan (COZAAR) 100 MG tablet TAKE 1 TABLET EVERY DAY 90 tablet 1   No facility-administered medications prior to visit.  No Known Allergies  ROS As per HPI  PE: Blood pressure (!) 147/75, pulse 72, temperature (!) 97.2 F (36.2 C), temperature source Oral, height 4' 11.5" (1.511 m), weight 149 lb 9.6 oz (67.9 kg), SpO2 98 %. Gen: Alert, well appearing.  Patient is oriented to person, place, time, and situation. AFFECT: pleasant, lucid thought and speech. CV: RRR, no m/r/g.   LUNGS: CTA bilat, nonlabored resps, good aeration in all lung fields. EXT: no clubbing, cyanosis, or edema.  Neuro: CN 2-12 intact bilaterally, strength 5/5 in proximal and distal upper extremities and lower extremities bilaterally.  No sensory deficits.  No tremor.  No disdiadochokinesis.  No ataxia.  Upper extremity and lower  extremity DTRs symmetric.  No pronator drift.   LABS:  Lab Results  Component Value Date   TSH 1.86 01/13/2017   Lab Results  Component Value Date   WBC 6.6 01/13/2017   HGB 11.8 (L) 01/13/2017   HCT 38.5 01/13/2017   MCV 66.3 Repeated and verified X2. (L) 01/13/2017   PLT 282.0 01/13/2017   Lab Results  Component Value Date   CREATININE 0.76 01/13/2017   BUN 21 01/13/2017   NA 137 01/13/2017   K 4.3 01/13/2017   CL 97 01/13/2017   CO2 30 01/13/2017   Lab Results  Component Value Date   ALT 19 01/13/2017   AST 16 01/13/2017   ALKPHOS 64 01/13/2017   BILITOT 0.4 01/13/2017   Lab Results  Component Value Date   CHOL 198 01/06/2016   Lab Results  Component Value Date   HDL 49.90 01/06/2016   Lab Results  Component Value Date   LDLCALC 126 (H) 03/03/2014   Lab Results  Component Value Date   TRIG 202.0 (H) 01/06/2016   Lab Results  Component Value Date   CHOLHDL 4 01/06/2016   Lab Results  Component Value Date   HGBA1C 6.8 (H) 01/13/2017   12 lead EKG: sinus rhythm, rate 70, borderline 1st deg AV block (PR interval 200 msec), no ischemic changes, no hypertrophy, no ectopy.  She has TWI in V1 and biphasic TW in V2.  QT interval and QRS duration normal. No prior EKG for comparison.  IMPRESSION AND PLAN:  1) HTN: not well controlled. Change losartan to telmisartan 80mg  qd to try to get better control (plus losartan availability is on/off lately).  2) DM 2: diet ok.  No exercise. Historically well controlled. HbA1c today.  3) Chronic fatigue/daytime somnolence: worse last 6-8 mo. Snoring+.  Needs to ask husband whether or not he notes apneic or hypopneic events while she sleeps. Could be secondary to seasonal affective d/o as well.  We'll see how this improves with better bp control and make sure labs are ok at this time.  EKG reassuring. CBC, CMET today.  4) Dizzy spells: sounds like some mild lightheaded spells (very brief) but also sounds like 2  distinct episodes of BPPV. With this dizziness and increased fatigue lately I checked EKG to further eval whether or not this is an anginal equivalent.  Still need to keep this in mind even though EKG normal.  An After Visit Summary was printed and given to the patient.  FOLLOW UP: Return in about 2 weeks (around 07/31/2017) for f/u bp and dizziness and hypersomnolence.  Signed:  Crissie Sickles, MD           07/17/2017

## 2017-07-18 ENCOUNTER — Encounter: Payer: Self-pay | Admitting: Family Medicine

## 2017-07-21 ENCOUNTER — Other Ambulatory Visit: Payer: Self-pay | Admitting: *Deleted

## 2017-07-21 DIAGNOSIS — E875 Hyperkalemia: Secondary | ICD-10-CM

## 2017-07-21 MED ORDER — HYDROCHLOROTHIAZIDE 12.5 MG PO TABS: 12.5000 mg | ORAL_TABLET | Freq: Every day | ORAL | 3 refills | Status: DC

## 2017-07-28 ENCOUNTER — Other Ambulatory Visit (INDEPENDENT_AMBULATORY_CARE_PROVIDER_SITE_OTHER): Payer: Medicare Other

## 2017-07-28 DIAGNOSIS — E875 Hyperkalemia: Secondary | ICD-10-CM | POA: Diagnosis not present

## 2017-07-28 LAB — BASIC METABOLIC PANEL
BUN: 21 mg/dL (ref 6–23)
CHLORIDE: 98 meq/L (ref 96–112)
CO2: 30 meq/L (ref 19–32)
CREATININE: 0.72 mg/dL (ref 0.40–1.20)
Calcium: 9.8 mg/dL (ref 8.4–10.5)
GFR: 84.15 mL/min (ref >60.00)
Glucose, Bld: 149 mg/dL — ABNORMAL HIGH (ref 70–99)
POTASSIUM: 4.1 meq/L (ref 3.5–5.1)
Sodium: 139 mEq/L (ref 135–145)

## 2017-07-31 ENCOUNTER — Ambulatory Visit (INDEPENDENT_AMBULATORY_CARE_PROVIDER_SITE_OTHER): Payer: Medicare Other | Admitting: Family Medicine

## 2017-07-31 ENCOUNTER — Encounter: Payer: Self-pay | Admitting: Family Medicine

## 2017-07-31 VITALS — BP 132/71 | HR 80 | Temp 97.4°F | Resp 16 | Ht 59.0 in | Wt 149.0 lb

## 2017-07-31 DIAGNOSIS — E875 Hyperkalemia: Secondary | ICD-10-CM | POA: Diagnosis not present

## 2017-07-31 DIAGNOSIS — I10 Essential (primary) hypertension: Secondary | ICD-10-CM | POA: Diagnosis not present

## 2017-07-31 DIAGNOSIS — R42 Dizziness and giddiness: Secondary | ICD-10-CM | POA: Diagnosis not present

## 2017-07-31 NOTE — Progress Notes (Signed)
OFFICE VISIT  07/31/2017   CC:  Chief Complaint  Patient presents with  . Follow-up    dizziness and fatigue   HPI:    Patient is a 72 y.o. [name] who presents for 2 week f/u fatigue, hypersomnolence, and dizziness. Labs at last visit showed hyperkalemia (remaineder of CMET normal, CBC normal, EKG normal), so I decreased her ARB dose and added low dose hctz, advised low K diet--->her potassium repeat after this was normal.  Home bp's on current regimen/dosing have been 120s-140/6-50s-70s.  Not feeling as fatigued but still having brief wave of lightheadedness---sometimes very brief, sometimes 15 min or so. No vertigo since last visit. She feels like it is connected to taking her meds b/c this feeling is almost exclusively a early part of the day feeling. They can occur when sitting and when walking around.  Has brief feeling of loss of equilibrium and veers briefly when walking at times.  No headaches.  No vision complaints.  No new hearing deficits: has congenital deafness in R ear, presbycusia in L ear.    Husband has never witnessed any apneic spells when she sleeps.    Past Medical History:  Diagnosis Date  . Beta thalassemia, heterozygous    vs alpha thalassemia minor (pt's family originally from [place]).  Marland Kitchen CAP (community acquired pneumonia) 02/2010  . Colon cancer screening    As of 06/2016, she declines any further colon cancer screening.  . Congenital sensorineural hearing loss    deaf in right ear; mild presbycusis in left ear (audiologist 2014)  . Depression   . HTN (hypertension)   . Hyperkalemia 07/17/2017   Decreased ARB dose, added low dose hctz, low K diet---repeat BMET 1 wk.  . Hyperlipidemia, mixed    Refuses statins  . Hypothyroidism   . Osteopenia    DEXAs 2003-2013--pt has declined bisphosphonates  . Somnolence, daytime, controlled 04/22/2012  . Type II or unspecified type diabetes mellitus without mention of complication, uncontrolled   .  Vitamin D deficiency     Past Surgical History:  Procedure Laterality Date  . BREAST BIOPSY     Left breast biopsy x 2 (fibrocystic/benign)  . COLONOSCOPY  2008   Normal.  10 yr recall (initial colonoscopy done in [place]--needs referral to local GI in 2018.  . TONSILLECTOMY AND ADENOIDECTOMY     as a child    Outpatient Medications Prior to Visit  Medication Sig Dispense Refill  . amLODipine (NORVASC) 5 MG tablet Take 1 tablet (5 mg total) by mouth daily. 90 tablet 1  . aspirin EC 81 MG tablet Take 1 tablet (81 mg total) by mouth daily. (Patient taking differently: Take 81 mg by mouth every other day. ) 30 tablet 0  . Calcium Carbonate-Vitamin D (CALCIUM 600+D) 600-400 MG-UNIT per tablet Take 1 tablet by mouth 2 (two) times daily.    . [facility] LANCETS ULTRA-THIN 30G MISC USE AS DIRECTED - CHECK BLOOD SUGAR ONCE DAILY. ICD 10: E11.9 100 each 2  . hydrochlorothiazide (HYDRODIURIL) 12.5 MG tablet Take 1 tablet (12.5 mg total) by mouth daily. 30 tablet 3  . levothyroxine (SYNTHROID, LEVOTHROID) 125 MCG tablet TAKE 1 TABLET DAILY. 90 tablet 1  . metFORMIN (GLUCOPHAGE) 500 MG tablet TAKE 1 TABLET TWICE DAILY 180 tablet 1  . Omega-3 Fatty Acids (FISH OIL) 1000 MG CAPS Take 1 capsule by mouth 2 (two) times daily.    . ONE TOUCH ULTRA TEST test strip TEST 1 EACH BY OTHER ROUTE DAILY. DIAG E11.9  100 each 3  . telmisartan (MICARDIS) 80 MG tablet Take 1 tablet (80 mg total) by mouth daily. (Patient taking differently: Take 80 mg by mouth daily. 1/2 tab every day) 30 tablet 0   No facility-administered medications prior to visit.     No Known Allergies  ROS As per HPI  PE: Blood pressure 132/71, pulse 80, temperature (!) 97.4 F (36.3 C), temperature source Oral, resp. rate 16, height 4\' 11"  (1.499 m), weight 149 lb (67.6 kg), SpO2 96 %. Gen: Alert, well appearing.  Patient is oriented to person, place, time, and situation. AFFECT: pleasant, lucid thought and speech. Dix -Halpike: no  vertigo or nystagmus induced.  LABS:  Lab Results  Component Value Date   TSH 1.86 01/13/2017   Lab Results  Component Value Date   WBC 5.2 07/17/2017   HGB 11.8 (L) 07/17/2017   HCT 37.6 07/17/2017   MCV 65.4 Repeated and verified X2. (L) 07/17/2017   PLT 250.0 07/17/2017   Lab Results  Component Value Date   CREATININE 0.72 07/28/2017   BUN 21 07/28/2017   NA 139 07/28/2017   K 4.1 07/28/2017   CL 98 07/28/2017   CO2 30 07/28/2017   Lab Results  Component Value Date   ALT 18 07/17/2017   AST 15 07/17/2017   ALKPHOS 62 07/17/2017   BILITOT 0.3 07/17/2017   Lab Results  Component Value Date   CHOL 198 01/06/2016   Lab Results  Component Value Date   HDL 49.90 01/06/2016   Lab Results  Component Value Date   LDLCALC 126 (H) 03/03/2014   Lab Results  Component Value Date   TRIG 202.0 (H) 01/06/2016   Lab Results  Component Value Date   CHOLHDL 4 01/06/2016   Lab Results  Component Value Date   HGBA1C 6.9 (H) 07/17/2017    IMPRESSION AND PLAN:  1) Vague lightheaded feeling, intermittent.  ? Brief ataxia associated with this. Two episodes of vertigo seemed to come near the beginning of this problem. Recent w/u so far unrevealing. She is a bit improved regarding her fatigue. Watchful waiting approach decided on today: f/u 1 mo. Signs/symptoms to call or return for were reviewed and pt expressed understanding.  2) HTN: stable w/recent regimen changes.  3) Hyperkalemia: resolved with cutting ARB in half and adding low dose hctz.  (If not improving or if worsening, check MRI brain. Consider cardiac stress test-->anginal equivalent?)   An After Visit Summary was printed and given to the patient.  FOLLOW UP: Return in about 1 month (around 08/28/2017) for f/u dizziness.  Signed:  Crissie Sickles, MD           07/31/2017

## 2017-08-07 ENCOUNTER — Other Ambulatory Visit: Payer: Self-pay | Admitting: Family Medicine

## 2017-08-07 DIAGNOSIS — Z1231 Encounter for screening mammogram for malignant neoplasm of breast: Secondary | ICD-10-CM

## 2017-08-11 ENCOUNTER — Other Ambulatory Visit: Payer: Self-pay

## 2017-08-15 ENCOUNTER — Other Ambulatory Visit: Payer: Self-pay

## 2017-08-15 MED ORDER — TELMISARTAN 80 MG PO TABS: 80.0000 mg | ORAL_TABLET | Freq: Every day | ORAL | 3 refills | Status: DC

## 2017-08-23 DIAGNOSIS — H35033 Hypertensive retinopathy, bilateral: Secondary | ICD-10-CM | POA: Diagnosis not present

## 2017-08-23 DIAGNOSIS — E119 Type 2 diabetes mellitus without complications: Secondary | ICD-10-CM | POA: Diagnosis not present

## 2017-08-23 LAB — HM DIABETES EYE EXAM

## 2017-08-25 ENCOUNTER — Encounter: Payer: Self-pay | Admitting: *Deleted

## 2017-08-28 ENCOUNTER — Ambulatory Visit: Payer: Medicare Other | Admitting: Family Medicine

## 2017-08-30 ENCOUNTER — Ambulatory Visit: Payer: Medicare Other

## 2017-09-01 ENCOUNTER — Encounter: Payer: Self-pay | Admitting: Family Medicine

## 2017-09-01 ENCOUNTER — Ambulatory Visit (INDEPENDENT_AMBULATORY_CARE_PROVIDER_SITE_OTHER): Payer: Medicare Other | Admitting: Family Medicine

## 2017-09-01 VITALS — BP 128/75 | HR 72 | Temp 98.6°F | Resp 16 | Ht 59.0 in | Wt 150.5 lb

## 2017-09-01 DIAGNOSIS — R42 Dizziness and giddiness: Secondary | ICD-10-CM | POA: Diagnosis not present

## 2017-09-01 DIAGNOSIS — E669 Obesity, unspecified: Secondary | ICD-10-CM | POA: Diagnosis not present

## 2017-09-01 DIAGNOSIS — E119 Type 2 diabetes mellitus without complications: Secondary | ICD-10-CM

## 2017-09-01 DIAGNOSIS — I10 Essential (primary) hypertension: Secondary | ICD-10-CM

## 2017-09-01 MED ORDER — TELMISARTAN 80 MG PO TABS: ORAL_TABLET | ORAL | 3 refills | Status: DC

## 2017-09-01 NOTE — Progress Notes (Signed)
OFFICE VISIT  09/01/2017   CC:  Chief Complaint  Patient presents with  . Follow-up    dizziness and BP    HPI:    Patient is a 72 y.o. [name] who presents for 1 mo f/u disequilibrium, HTN. Has been describing brief (1-2 min up to sometimes 15 min) of lightheaded sensation, some need to feel like she needs to be careful to steady herself when walking. Was improved 1 mo ago after slight change of bp meds that we did due to hyperkalemia.  K repeat normal.  Doing well--rarely has a little twinge of mild lightheaded feeling--very brief.  Feeling MUCH better and is happier.  Home bp's: 130s 140s consistently, great diastolics, HR 62B avg.  DM: glucoses slowly "creeping up" into 130s to 150 range consistently last couple months.  Past Medical History:  Diagnosis Date  . Beta thalassemia, heterozygous    vs alpha thalassemia minor (pt's family originally from [place]).  Marland Kitchen CAP (community acquired pneumonia) 02/2010  . Colon cancer screening    As of 06/2016, she declines any further colon cancer screening.  . Congenital sensorineural hearing loss    deaf in right ear; mild presbycusis in left ear (audiologist 2014)  . Depression   . HTN (hypertension)   . Hyperkalemia 07/17/2017   Decreased ARB dose, added low dose hctz, low K diet---repeat K normal.  . Hyperlipidemia, mixed    Refuses statins  . Hypothyroidism   . Osteopenia    DEXAs 2003-2013--pt has declined bisphosphonates  . Somnolence, daytime, controlled 04/22/2012  . Type II or unspecified type diabetes mellitus without mention of complication, uncontrolled   . Vitamin D deficiency     Past Surgical History:  Procedure Laterality Date  . BREAST BIOPSY     Left breast biopsy x 2 (fibrocystic/benign)  . COLONOSCOPY  2008   Normal.  10 yr recall (initial colonoscopy done in [place]--needs referral to local GI in 2018.  . TONSILLECTOMY AND ADENOIDECTOMY     as a child    Outpatient Medications Prior to  Visit  Medication Sig Dispense Refill  . amLODipine (NORVASC) 5 MG tablet Take 1 tablet (5 mg total) by mouth daily. 90 tablet 1  . aspirin EC 81 MG tablet Take 1 tablet (81 mg total) by mouth daily. (Patient taking differently: Take 81 mg by mouth every other day. ) 30 tablet 0  . Calcium Carbonate-Vitamin D (CALCIUM 600+D) 600-400 MG-UNIT per tablet Take 1 tablet by mouth 2 (two) times daily.    . [facility] LANCETS ULTRA-THIN 30G MISC USE AS DIRECTED - CHECK BLOOD SUGAR ONCE DAILY. ICD 10: E11.9 100 each 2  . hydrochlorothiazide (HYDRODIURIL) 12.5 MG tablet Take 1 tablet (12.5 mg total) by mouth daily. 30 tablet 3  . levothyroxine (SYNTHROID, LEVOTHROID) 125 MCG tablet TAKE 1 TABLET DAILY. 90 tablet 1  . metFORMIN (GLUCOPHAGE) 500 MG tablet TAKE 1 TABLET TWICE DAILY 180 tablet 1  . Omega-3 Fatty Acids (FISH OIL) 1000 MG CAPS Take 1 capsule by mouth 2 (two) times daily.    . ONE TOUCH ULTRA TEST test strip TEST 1 EACH BY OTHER ROUTE DAILY. DIAG E11.9 100 each 3  . telmisartan (MICARDIS) 80 MG tablet Take 1 tablet (80 mg total) by mouth daily. 30 tablet 3   No facility-administered medications prior to visit.     No Known Allergies  ROS As per HPI  PE: Blood pressure 128/75, pulse 72, temperature 98.6 F (37 C), temperature source Oral, resp. rate  16, height 4\' 11"  (1.499 m), weight 150 lb 8 oz (68.3 kg), SpO2 97 %. Body mass index is 30.4 kg/m.  Gen: Alert, well appearing.  Patient is oriented to person, place, time, and situation. AFFECT: pleasant, lucid thought and speech. EXT; trace pitting edema in both LL's. No further exam today.  LABS:    Chemistry      Component Value Date/Time   NA 139 07/28/2017 0957   K 4.1 07/28/2017 0957   CL 98 07/28/2017 0957   CO2 30 07/28/2017 0957   BUN 21 07/28/2017 0957   CREATININE 0.72 07/28/2017 0957      Component Value Date/Time   CALCIUM 9.8 07/28/2017 0957   ALKPHOS 62 07/17/2017 1039   AST 15 07/17/2017 1039   ALT 18 07/17/2017  1039   BILITOT 0.3 07/17/2017 1039     Lab Results  Component Value Date   TSH 1.86 01/13/2017   Lab Results  Component Value Date   WBC 5.2 07/17/2017   HGB 11.8 (L) 07/17/2017   HCT 37.6 07/17/2017   MCV 65.4 Repeated and verified X2. (L) 07/17/2017   PLT 250.0 07/17/2017   Lab Results  Component Value Date   HGBA1C 6.9 (H) 07/17/2017   IMPRESSION AND PLAN:  1) Disequilibrium: resolved-->likely multifactorial etiology (timing of bp med, water intake, psychological issues due to seasonal affective d/o).  2) HTN: bp's mildly up systolic but I want to just keep things the same for now. If consistently the same at next f/u in 2 mo will increase hctz to 25mg  qd.  3) DM: glucoses creeping up slightly but max fasting 150. At this time I encouraged her to increase exercise and try to watch diet closer. If A1c up to 7.5% or more at next check in 2 mo, will make diabetic med adjustment.  An After Visit Summary was printed and given to the patient.  FOLLOW UP: Return in about 2 months (around 11/01/2017) for routine chronic illness f/u.  Signed:  Crissie Sickles, MD           09/01/2017

## 2017-09-05 ENCOUNTER — Telehealth: Payer: Self-pay | Admitting: Family Medicine

## 2017-09-06 MED ORDER — TELMISARTAN 40 MG PO TABS: 40.0000 mg | ORAL_TABLET | Freq: Every day | ORAL | 1 refills | Status: DC

## 2017-09-06 NOTE — Telephone Encounter (Signed)
Will send in new rx for 40mg  telmisartan, 1 qd.

## 2017-09-06 NOTE — Telephone Encounter (Signed)
Pharmacy comment: RX FOR TELMISARTAN 80MG  TAB WITH DIRECTIONS TO CUT THIS TABLET. THIS MEDICATION IS NOT DESIGNED TO BE SPLIT AND CUTTING MAY ALTER ITS RELEASE. PLEASE CLARIFY THE DIRECTIONS AND STRENGTH BELOW.    Please advise. Thanks.

## 2017-09-07 MED ORDER — TELMISARTAN 40 MG PO TABS: 40.0000 mg | ORAL_TABLET | Freq: Every day | ORAL | 1 refills | Status: DC

## 2017-09-07 NOTE — Telephone Encounter (Signed)
Resent Rx to [facility], called [facility] and cancelled [facility]. Pt advised and voiced understanding.

## 2017-09-07 NOTE — Telephone Encounter (Signed)
Pt called to f/u on RX for telmisartan that was to go to [facility].  Based on this telephone encounter [employer] recommended change from 1/2 tab or 80mg  to 1 tab of 40mg . This RX was sent in yesterday [facility] in [facility]. Please resend RX for telmisartan 40mg  in 90 day supply to [facility] and cancel at [place].  [place], [place]

## 2017-09-21 ENCOUNTER — Ambulatory Visit
Admission: RE | Admit: 2017-09-21 | Discharge: 2017-09-21 | Disposition: A | Discharge status: Home / Self Care | Payer: Medicare Other | Source: Ambulatory Visit | Attending: Family Medicine | Admitting: Family Medicine

## 2017-09-21 DIAGNOSIS — Z1231 Encounter for screening mammogram for malignant neoplasm of breast: Secondary | ICD-10-CM

## 2017-09-27 ENCOUNTER — Other Ambulatory Visit: Payer: Self-pay | Admitting: Family Medicine

## 2017-09-29 ENCOUNTER — Other Ambulatory Visit: Payer: Self-pay | Admitting: *Deleted

## 2017-09-29 MED ORDER — HYDROCHLOROTHIAZIDE 12.5 MG PO TABS: 12.5000 mg | ORAL_TABLET | Freq: Every day | ORAL | 0 refills | Status: DC

## 2017-10-06 ENCOUNTER — Other Ambulatory Visit: Payer: Self-pay | Admitting: Family Medicine

## 2017-10-06 MED ORDER — HYDROCHLOROTHIAZIDE 12.5 MG PO TABS: 12.5000 mg | ORAL_TABLET | Freq: Every day | ORAL | 1 refills | Status: DC

## 2017-10-24 ENCOUNTER — Other Ambulatory Visit: Payer: Self-pay | Admitting: Family Medicine

## 2017-10-24 DIAGNOSIS — E119 Type 2 diabetes mellitus without complications: Secondary | ICD-10-CM

## 2017-11-02 ENCOUNTER — Ambulatory Visit (INDEPENDENT_AMBULATORY_CARE_PROVIDER_SITE_OTHER): Payer: Medicare Other | Admitting: Family Medicine

## 2017-11-02 ENCOUNTER — Encounter: Payer: Self-pay | Admitting: Family Medicine

## 2017-11-02 VITALS — BP 122/78 | HR 67 | Temp 97.9°F | Resp 16 | Ht 59.0 in | Wt 148.2 lb

## 2017-11-02 DIAGNOSIS — E119 Type 2 diabetes mellitus without complications: Secondary | ICD-10-CM

## 2017-11-02 DIAGNOSIS — R197 Diarrhea, unspecified: Secondary | ICD-10-CM

## 2017-11-02 DIAGNOSIS — I10 Essential (primary) hypertension: Secondary | ICD-10-CM | POA: Diagnosis not present

## 2017-11-02 LAB — SEDIMENTATION RATE: Sed Rate: 34 mm/hr — ABNORMAL HIGH (ref 0–30)

## 2017-11-02 LAB — COMPREHENSIVE METABOLIC PANEL
ALT: 19 U/L (ref 0–35)
AST: 14 U/L (ref 0–37)
Albumin: 4.7 g/dL (ref 3.5–5.2)
Alkaline Phosphatase: 60 U/L (ref 39–117)
BUN: 19 mg/dL (ref 6–23)
CALCIUM: 10.1 mg/dL (ref 8.4–10.5)
CHLORIDE: 99 meq/L (ref 96–112)
CO2: 31 meq/L (ref 19–32)
Creatinine, Ser: 0.75 mg/dL (ref 0.40–1.20)
GFR: 80.22 mL/min (ref >60.00)
Glucose, Bld: 93 mg/dL (ref 70–99)
Potassium: 4.2 mEq/L (ref 3.5–5.1)
Sodium: 138 mEq/L (ref 135–145)
Total Bilirubin: 0.4 mg/dL (ref 0.2–1.2)
Total Protein: 7.2 g/dL (ref 6.0–8.3)

## 2017-11-02 LAB — HEMOGLOBIN A1C: HEMOGLOBIN A1C: 7 % — AB (ref 4.6–6.5)

## 2017-11-02 LAB — IGA: IgA: 171 mg/dL (ref 68–378)

## 2017-11-02 MED ORDER — HYOSCYAMINE SULFATE 0.125 MG PO TABS: ORAL_TABLET | ORAL | 2 refills | Status: DC

## 2017-11-02 NOTE — Addendum Note (Signed)
Addended by: Gordy Councilman on: 11/02/2017 02:12 PM   Modules accepted: Orders

## 2017-11-02 NOTE — Progress Notes (Signed)
OFFICE VISIT  11/02/2017   CC:  Chief Complaint  Patient presents with  . Follow-up    RCI, pt is not fasting.    HPI:    Patient is a 72 y.o. [name] who presents for f/u DM 2, HTN.  DM: fasting gluc's avg 130s fasting. No checks later in the day. Diet: does a pretty good diab diet, could eat more fresh veggies. Exercise: lots of gardening.  No formal exercise regimen.  HTN: mid/upper 130s/70s, HR 70s.   Not checking more than a few times a month.  She is concerned that her dizziness earlier this year was due to a brief change in manufacturer of generic thyroid pill. She is back on her old one and has not had any dizziness since.  New complaint:  Has brief periods of urinary incontinence: sometimes loses most of bladder several times a day, but then will go weeks w/out any thing at all, not even any dribbling.  No hematuria or dysuria.  No urinary frequency. Also with lifelong waxing/waning loose/urgent BMs.  Says this has been much worse the last month or so, very urgent BMs at times, it is loose most of these times and has had accidents in public before x 4 episodes.  No abd pain/cramping is associated.  Not consistently related with postprandial state--"seem random".  Good amount of stool each BM, no sensation of incomplete evacuation.  Rare periods of constipation. No blood or pus in stool.  No changes in diet recently.  Sx's not associated with any of her meds. No fevers, no rashes, no joint swelling.  Past Medical History:  Diagnosis Date  . Beta thalassemia, heterozygous    vs alpha thalassemia minor (pt's family originally from [place]).  Marland Kitchen CAP (community acquired pneumonia) 02/2010  . Colon cancer screening    As of 06/2016, she declines any further colon cancer screening.  . Congenital sensorineural hearing loss    deaf in right ear; mild presbycusis in left ear (audiologist 2014)  . Depression   . HTN (hypertension)   . Hyperkalemia 07/17/2017   Decreased  ARB dose, added low dose hctz, low K diet---repeat K normal.  . Hyperlipidemia, mixed    Refuses statins  . Hypothyroidism   . Osteopenia    DEXAs 2003-2013--pt has declined bisphosphonates  . Somnolence, daytime, controlled 04/22/2012  . Type II or unspecified type diabetes mellitus without mention of complication, uncontrolled   . Vitamin D deficiency     Past Surgical History:  Procedure Laterality Date  . BREAST BIOPSY     Left breast biopsy x 2 (fibrocystic/benign)  . COLONOSCOPY  2008   Normal.  10 yr recall (initial colonoscopy done in [place]--needs referral to local GI in 2018.  . TONSILLECTOMY AND ADENOIDECTOMY     as a child    Outpatient Medications Prior to Visit  Medication Sig Dispense Refill  . amLODipine (NORVASC) 5 MG tablet Take 1 tablet (5 mg total) by mouth daily. 90 tablet 1  . aspirin EC 81 MG tablet Take 1 tablet (81 mg total) by mouth daily. (Patient taking differently: Take 81 mg by mouth every other day. ) 30 tablet 0  . Calcium Carbonate-Vitamin D (CALCIUM 600+D) 600-400 MG-UNIT per tablet Take 1 tablet by mouth 2 (two) times daily.    . [facility] LANCETS ULTRA-THIN 30G MISC USE AS DIRECTED - CHECK BLOOD SUGAR ONCE DAILY. ICD 10: E11.9 100 each 2  . hydrochlorothiazide (HYDRODIURIL) 12.5 MG tablet Take 1 tablet (12.5 mg  total) by mouth daily. 90 tablet 1  . levothyroxine (SYNTHROID, LEVOTHROID) 125 MCG tablet TAKE 1 TABLET EVERY DAY 90 tablet 0  . metFORMIN (GLUCOPHAGE) 500 MG tablet TAKE 1 TABLET TWICE DAILY 180 tablet 1  . Omega-3 Fatty Acids (FISH OIL) 1000 MG CAPS Take 1 capsule by mouth 2 (two) times daily.    . ONE TOUCH ULTRA TEST test strip TEST 1 EACH BY OTHER ROUTE DAILY. DIAG E11.9 100 each 3  . telmisartan (MICARDIS) 40 MG tablet Take 1 tablet (40 mg total) by mouth daily. 90 tablet 1   No facility-administered medications prior to visit.     No Known Allergies  ROS As per HPI  PE: Blood pressure 122/78, pulse 67, temperature 97.9 F  (36.6 C), temperature source Oral, resp. rate 16, height '4\' 11"'  (1.499 m), weight 148 lb 4 oz (67.2 kg), SpO2 99 %. Gen: Alert, well appearing.  Patient is oriented to person, place, time, and situation. AFFECT: pleasant, lucid thought and speech. CV: RRR, no m/r/g.   LUNGS: CTA bilat, nonlabored resps, good aeration in all lung fields. ABD: soft, NT, ND, BS normal.  No hepatospenomegaly or mass.  No bruits. EXT: no clubbing, cyanosis, or edema.    LABS:  Lab Results  Component Value Date   TSH 1.86 01/13/2017   Lab Results  Component Value Date   WBC 5.2 07/17/2017   HGB 11.8 (L) 07/17/2017   HCT 37.6 07/17/2017   MCV 65.4 Repeated and verified X2. (L) 07/17/2017   PLT 250.0 07/17/2017   Lab Results  Component Value Date   CREATININE 0.72 07/28/2017   BUN 21 07/28/2017   NA 139 07/28/2017   K 4.1 07/28/2017   CL 98 07/28/2017   CO2 30 07/28/2017   Lab Results  Component Value Date   ALT 18 07/17/2017   AST 15 07/17/2017   ALKPHOS 62 07/17/2017   BILITOT 0.3 07/17/2017   Lab Results  Component Value Date   CHOL 198 01/06/2016   Lab Results  Component Value Date   HDL 49.90 01/06/2016   Lab Results  Component Value Date   LDLCALC 126 (H) 03/03/2014   Lab Results  Component Value Date   TRIG 202.0 (H) 01/06/2016   Lab Results  Component Value Date   CHOLHDL 4 01/06/2016   Lab Results  Component Value Date   HGBA1C 6.9 (H) 07/17/2017    IMPRESSION AND PLAN:  1) DM 2, good control. HbA1c today.  2) HTN; The current medical regimen is effective;  continue present plan and medications.  3) Chronic diarrhea, worsening x 1 mo. Question functional GI disorder vs malabsorption.  Inflammatory bowel dz and infection much less likely. Will check CBC, CMET, ESR, TTG IgA, IgA total, and check stool for giardia. Start trial of levsin 0.1110m, 1-2 q4h prn. Also: Take imodium (over the counter anti-diarrhea med) 1-2 times per day AS NEEDED for diarrhea. If no  signif response to these med trials + all labs neg, will ask GI to see (? bile acid diarrhea-->low suspicion given she still has her GB.  Chronic pancreatic exocrine insufficiency malabsorption?).  An After Visit Summary was printed and given to the patient.  FOLLOW UP: Return in about 4 weeks (around 11/30/2017) for f/u diarrhea.  Signed:  PCrissie Sickles MD           11/02/2017

## 2017-11-02 NOTE — Patient Instructions (Signed)
Take imodium (over the counter anti-diarrhea med) 1-2 times per day AS NEEDED for diarrhea.

## 2017-11-03 LAB — GIARDIA/CRYPTOSPORIDIUM (EIA)
MICRO NUMBER: 90943672
MICRO NUMBER:: 90943671
RESULT: NOT DETECTED
RESULT:: NOT DETECTED
SPECIMEN QUALITY:: ADEQUATE
SPECIMEN QUALITY:: ADEQUATE

## 2017-11-03 LAB — TISSUE TRANSGLUTAMINASE, IGA: (tTG) Ab, IgA: 2 U/mL

## 2017-12-05 ENCOUNTER — Ambulatory Visit (INDEPENDENT_AMBULATORY_CARE_PROVIDER_SITE_OTHER): Payer: Medicare Other | Admitting: Family Medicine

## 2017-12-05 ENCOUNTER — Encounter: Payer: Self-pay | Admitting: Family Medicine

## 2017-12-05 VITALS — BP 124/73 | HR 67 | Temp 98.4°F | Resp 16 | Ht 59.0 in | Wt 147.4 lb

## 2017-12-05 DIAGNOSIS — R152 Fecal urgency: Secondary | ICD-10-CM | POA: Diagnosis not present

## 2017-12-05 DIAGNOSIS — E663 Overweight: Secondary | ICD-10-CM | POA: Diagnosis not present

## 2017-12-05 DIAGNOSIS — K529 Noninfective gastroenteritis and colitis, unspecified: Secondary | ICD-10-CM | POA: Diagnosis not present

## 2017-12-05 DIAGNOSIS — R159 Full incontinence of feces: Secondary | ICD-10-CM

## 2017-12-05 NOTE — Progress Notes (Signed)
OFFICE VISIT  12/05/2017   CC:  Chief Complaint  Patient presents with  . Follow-up    diarrhea   HPI:    Patient is a 72 y.o. [name] who presents for 1 mo f/u worsening of chronic diarrhea. Lab/stool eval normal.   Levsin trial and prn imodium recommended last visit.  "Hard to tell if I'm better b/c it is so sporadic". Used imodium some since last visit, on cruise ship.  Unclear whether she actually tried levsin or not. Since I last saw her she has had a couple of instances in which she had BM before getting to the bathroom. On a couple of occasions she recalls the very urgent bm/incontinence come after having eaten a rich [place] cheese. Also on one occasion occurred while gardening in very hot weather. Otherwise, she states there is still no clear and consistent correlation with types of food or certain activities, though.  No abd pain, no GI upset, no bloating, no fevers, no blood in stool, no pus in stool. Some stools are normal/formed.   Past Medical History:  Diagnosis Date  . Beta thalassemia, heterozygous    vs alpha thalassemia minor (pt's family originally from [place]).  Marland Kitchen CAP (community acquired pneumonia) 02/2010  . Colon cancer screening    As of 06/2016, she declines any further colon cancer screening.  . Congenital sensorineural hearing loss    deaf in right ear; mild presbycusis in left ear (audiologist 2014)  . Depression   . HTN (hypertension)   . Hyperkalemia 07/17/2017   Decreased ARB dose, added low dose hctz, low K diet---repeat K normal.  . Hyperlipidemia, mixed    Refuses statins  . Hypothyroidism   . Osteopenia    DEXAs 2003-2013--pt has declined bisphosphonates  . Somnolence, daytime, controlled 04/22/2012  . Type II or unspecified type diabetes mellitus without mention of complication, uncontrolled   . Vitamin D deficiency     Past Surgical History:  Procedure Laterality Date  . BREAST BIOPSY     Left breast biopsy x 2  (fibrocystic/benign)  . COLONOSCOPY  2008   Normal.  10 yr recall (initial colonoscopy done in [place]--needs referral to local GI in 2018.  . TONSILLECTOMY AND ADENOIDECTOMY     as a child    Outpatient Medications Prior to Visit  Medication Sig Dispense Refill  . amLODipine (NORVASC) 5 MG tablet Take 1 tablet (5 mg total) by mouth daily. 90 tablet 1  . aspirin EC 81 MG tablet Take 1 tablet (81 mg total) by mouth daily. (Patient taking differently: Take 81 mg by mouth every other day. ) 30 tablet 0  . Calcium Carbonate-Vitamin D (CALCIUM 600+D) 600-400 MG-UNIT per tablet Take 1 tablet by mouth 2 (two) times daily.    . [facility] LANCETS ULTRA-THIN 30G MISC USE AS DIRECTED - CHECK BLOOD SUGAR ONCE DAILY. ICD 10: E11.9 100 each 2  . hydrochlorothiazide (HYDRODIURIL) 12.5 MG tablet Take 1 tablet (12.5 mg total) by mouth daily. 90 tablet 1  . hyoscyamine (LEVSIN, ANASPAZ) 0.125 MG tablet 1-2 tabs q4h prn 30 tablet 2  . levothyroxine (SYNTHROID, LEVOTHROID) 125 MCG tablet TAKE 1 TABLET EVERY DAY 90 tablet 0  . metFORMIN (GLUCOPHAGE) 500 MG tablet TAKE 1 TABLET TWICE DAILY 180 tablet 1  . Omega-3 Fatty Acids (FISH OIL) 1000 MG CAPS Take 1 capsule by mouth 2 (two) times daily.    . ONE TOUCH ULTRA TEST test strip TEST 1 EACH BY OTHER ROUTE DAILY. DIAG E11.9 100  each 3  . telmisartan (MICARDIS) 40 MG tablet Take 1 tablet (40 mg total) by mouth daily. 90 tablet 1   No facility-administered medications prior to visit.     No Known Allergies  ROS As per HPI  PE: Blood pressure 124/73, pulse 67, temperature 98.4 F (36.9 C), temperature source Oral, resp. rate 16, height 4\' 11"  (1.499 m), weight 147 lb 6 oz (66.8 kg), SpO2 95 %. Gen: Alert, well appearing.  Patient is oriented to person, place, time, and situation. AFFECT: pleasant, lucid thought and speech. No further exam today.  LABS:    Chemistry      Component Value Date/Time   NA 138 11/02/2017 1051   K 4.2 11/02/2017 1051   CL 99  11/02/2017 1051   CO2 31 11/02/2017 1051   BUN 19 11/02/2017 1051   CREATININE 0.75 11/02/2017 1051      Component Value Date/Time   CALCIUM 10.1 11/02/2017 1051   ALKPHOS 60 11/02/2017 1051   AST 14 11/02/2017 1051   ALT 19 11/02/2017 1051   BILITOT 0.4 11/02/2017 1051     Lab Results  Component Value Date   WBC 5.2 07/17/2017   HGB 11.8 (L) 07/17/2017   HCT 37.6 07/17/2017   MCV 65.4 Repeated and verified X2. (L) 07/17/2017   PLT 250.0 07/17/2017   Lab Results  Component Value Date   ESRSEDRATE 34 (H) 11/02/2017   Lab Results  Component Value Date   HGBA1C 7.0 (H) 11/02/2017   Lab Results  Component Value Date   TSH 1.86 01/13/2017    IMPRESSION AND PLAN:  1) Chronic diarrhea, more problematic urgency last several months, with some very frustrating/embarrassing fecal incontinence due to severe urgency. Most sx's + normal exam and lab eval support dx of functional-type diarrhea. However, still need to consider either a trial off of metformin to see if this med is making things worse AND consider food elimination trial (s) to try to see the effect of this on her sx's.   I also offered GI referral at this time to see if they recommend any additional testing/treatment. She chose to try eliminating some food types in a way that could help her see if sx's consistently connected to this or not. She declined GI referral at this time.  An After Visit Summary was printed and given to the patient.  FOLLOW UP: Return in about 2 months (around 02/04/2018) for routine chronic illness f/u/diarrhea.  Signed:  Crissie Sickles, MD           12/05/2017

## 2017-12-07 ENCOUNTER — Ambulatory Visit: Payer: Medicare Other | Admitting: Family Medicine

## 2017-12-12 ENCOUNTER — Other Ambulatory Visit: Payer: Self-pay | Admitting: Physician Assistant

## 2017-12-12 ENCOUNTER — Other Ambulatory Visit: Payer: Self-pay | Admitting: Family Medicine

## 2017-12-12 ENCOUNTER — Other Ambulatory Visit: Payer: Self-pay

## 2017-12-12 MED ORDER — GLUCOSE BLOOD VI STRP: ORAL_STRIP | 3 refills | Status: DC

## 2017-12-12 MED ORDER — CVS LANCETS ULTRA-THIN 30G MISC: 1.0000 | Freq: Every day | 2 refills | Status: DC

## 2018-01-26 DIAGNOSIS — T783XXA Angioneurotic edema, initial encounter: Secondary | ICD-10-CM

## 2018-01-26 HISTORY — DX: Angioneurotic edema, initial encounter: T78.3XXA

## 2018-01-29 ENCOUNTER — Telehealth: Payer: Self-pay

## 2018-01-29 NOTE — Telephone Encounter (Signed)
Noted  

## 2018-01-29 NOTE — Telephone Encounter (Signed)
[facility] note received via fax  Call:     Date: 01/27/2018  Time: 1031   Caller: Mackenzie Wood Return number: [phone]  Nurse: Delfin Edis, RN  Chief Complaint: Hives  Reason for call: "Caller states she thinks she might have hives. They are around her mouth and lips. Her lips feel swollen on the right lower lip and into her cheek, so is her left eye. She has new medications. She wants to know if she can take Allegra with her current medications."  Related visit to physician within the last 2 weeks: No  Guideline: Lip swelling  Disposition: Home Care  Triage nurse: "referred to [email or web address] for drug interaction chart to verify her medications did not interact with Allegra. No known interactions found with the medication list she gave me"  Called patient, she reports lips and face are "just about back to normal". Has continued Allegra x 3 days. F/U appt with PCP Wednesday, 02/07/18 for 2 month f/u.

## 2018-02-07 ENCOUNTER — Ambulatory Visit (INDEPENDENT_AMBULATORY_CARE_PROVIDER_SITE_OTHER): Payer: Medicare Other | Admitting: Family Medicine

## 2018-02-07 ENCOUNTER — Encounter: Payer: Self-pay | Admitting: Family Medicine

## 2018-02-07 VITALS — BP 121/71 | HR 72 | Temp 97.5°F | Resp 16 | Ht 59.0 in | Wt 148.1 lb

## 2018-02-07 DIAGNOSIS — E039 Hypothyroidism, unspecified: Secondary | ICD-10-CM

## 2018-02-07 DIAGNOSIS — Z1211 Encounter for screening for malignant neoplasm of colon: Secondary | ICD-10-CM

## 2018-02-07 DIAGNOSIS — T783XXA Angioneurotic edema, initial encounter: Secondary | ICD-10-CM

## 2018-02-07 DIAGNOSIS — E119 Type 2 diabetes mellitus without complications: Secondary | ICD-10-CM | POA: Diagnosis not present

## 2018-02-07 DIAGNOSIS — I10 Essential (primary) hypertension: Secondary | ICD-10-CM

## 2018-02-07 LAB — BASIC METABOLIC PANEL
BUN: 21 mg/dL (ref 6–23)
CHLORIDE: 99 meq/L (ref 96–112)
CO2: 31 mEq/L (ref 19–32)
Calcium: 9.7 mg/dL (ref 8.4–10.5)
Creatinine, Ser: 0.78 mg/dL (ref 0.40–1.20)
GFR: 76.61 mL/min (ref >60.00)
GLUCOSE: 231 mg/dL — AB (ref 70–99)
POTASSIUM: 3.7 meq/L (ref 3.5–5.1)
SODIUM: 138 meq/L (ref 135–145)

## 2018-02-07 LAB — COLOGUARD: Cologuard: NEGATIVE

## 2018-02-07 LAB — HEMOGLOBIN A1C: Hgb A1c MFr Bld: 7 % — ABNORMAL HIGH (ref 4.6–6.5)

## 2018-02-07 LAB — TSH: TSH: 2.61 u[IU]/mL (ref 0.35–4.50)

## 2018-02-07 MED ORDER — AMLODIPINE BESYLATE 10 MG PO TABS: 10.0000 mg | ORAL_TABLET | Freq: Every day | ORAL | 3 refills | Status: DC

## 2018-02-07 NOTE — Progress Notes (Signed)
OFFICE VISIT  02/07/2018   CC:  Chief Complaint  Patient presents with  . Follow-up    [facility], pt is not fasting.    HPI:    Patient is a 72 y.o. [name] who presents for 2 mo f/u chronic diarrhea, DM 2, and HTN.  Diarrhea: everything is the same.   Some days has 4-5 loose BMs/urgent per day.  Has urge incontinence as well, stable. When she goes out of the house, she has to work around these problems. She cannot figure out any pattern associated with food intake or medications. She is not as concerned with these things right now b/c she doesn't want to "pop a pill for everything".  HTN: upper 130s over 80 avg, HR avg 75.  DM: fasting glucoses: gradually has seen her fasting gluc rising over the last year or so. Avg 135.    About 2 wks ago she got an episode of lower lip numbness and swelling, which gradually involved the entire lower 1/2 of the face extending into anterior neck.  Allegra was taken daily and this resolved in about 5-6 days.  Muscles of face were normal. Tongue was not swollen, throat not swollen.  Mild infra-orbital edema.  No wheezing, SOB, or dizziness. No new meds coincidental with this.  No new foods or contact irritants or allergens. She continued to take all current chronic meds and it has not recurred. She denies having any similar kind of sx's in the past.  Past Medical History:  Diagnosis Date  . Beta thalassemia, heterozygous    vs alpha thalassemia minor (pt's family originally from [place]).  Marland Kitchen CAP (community acquired pneumonia) 02/2010  . Colon cancer screening    As of 06/2016, she declines any further colon cancer screening.  . Congenital sensorineural hearing loss    deaf in right ear; mild presbycusis in left ear (audiologist 2014)  . Depression   . HTN (hypertension)   . Hyperkalemia 07/17/2017   Decreased ARB dose, added low dose hctz, low K diet---repeat K normal.  . Hyperlipidemia, mixed    Refuses statins  . Hypothyroidism   .  Osteopenia    DEXAs 2003-2013--pt has declined bisphosphonates  . Somnolence, daytime, controlled 04/22/2012  . Type II or unspecified type diabetes mellitus without mention of complication, uncontrolled   . Vitamin D deficiency     Past Surgical History:  Procedure Laterality Date  . BREAST BIOPSY     Left breast biopsy x 2 (fibrocystic/benign)  . COLONOSCOPY  2008   Normal.  10 yr recall (initial colonoscopy done in [place]--needs referral to local GI in 2018.  . TONSILLECTOMY AND ADENOIDECTOMY     as a child    Outpatient Medications Prior to Visit  Medication Sig Dispense Refill  . aspirin EC 81 MG tablet Take 1 tablet (81 mg total) by mouth daily. (Patient taking differently: Take 81 mg by mouth every other day. ) 30 tablet 0  . Calcium Carbonate-Vitamin D (CALCIUM 600+D) 600-400 MG-UNIT per tablet Take 1 tablet by mouth 2 (two) times daily.    . [employer] LANCETS ULTRA-THIN 30G MISC 1 Device by Other route daily. 100 each 2  . glucose blood (ONE TOUCH ULTRA TEST) test strip TEST 1 EACH BY OTHER ROUTE DAILY. DIAG E11.9 100 each 3  . hydrochlorothiazide (HYDRODIURIL) 12.5 MG tablet Take 1 tablet (12.5 mg total) by mouth daily. 90 tablet 1  . hyoscyamine (LEVSIN, ANASPAZ) 0.125 MG tablet 1-2 tabs q4h prn 30 tablet 2  .  levothyroxine (SYNTHROID, LEVOTHROID) 125 MCG tablet TAKE 1 TABLET EVERY DAY 90 tablet 0  . metFORMIN (GLUCOPHAGE) 500 MG tablet TAKE 1 TABLET TWICE DAILY 180 tablet 1  . Omega-3 Fatty Acids (FISH OIL) 1000 MG CAPS Take 1 capsule by mouth 2 (two) times daily.    Marland Kitchen telmisartan (MICARDIS) 40 MG tablet Take 1 tablet (40 mg total) by mouth daily. 90 tablet 1  . amLODipine (NORVASC) 5 MG tablet TAKE 1 TABLET EVERY DAY 90 tablet 1   No facility-administered medications prior to visit.     No Known Allergies  ROS As per HPI  PE: Blood pressure 121/71, pulse 72, temperature (!) 97.5 F (36.4 C), temperature source Oral, resp. rate 16, height '4\' 11"'$  (1.499 m), weight  148 lb 2 oz (67.2 kg), SpO2 100 %. Gen: Alert, well appearing.  Patient is oriented to person, place, time, and situation. AFFECT: pleasant, lucid thought and speech. No facial swelling or rash.  Lips and tongue w/out swelling.  Oropharynx normal. Foot exam - bilateral normal; no swelling, tenderness or skin or vascular lesions. Color and temperature is normal. Sensation is intact. Peripheral pulses are palpable. Toenails are normal.   LABS:  Lab Results  Component Value Date   TSH 2.61 02/07/2018   Lab Results  Component Value Date   WBC 5.2 07/17/2017   HGB 11.8 (L) 07/17/2017   HCT 37.6 07/17/2017   MCV 65.4 Repeated and verified X2. (L) 07/17/2017   PLT 250.0 07/17/2017   Lab Results  Component Value Date   CREATININE 0.78 02/07/2018   BUN 21 02/07/2018   NA 138 02/07/2018   K 3.7 02/07/2018   CL 99 02/07/2018   CO2 31 02/07/2018   Lab Results  Component Value Date   ALT 19 11/02/2017   AST 14 11/02/2017   ALKPHOS 60 11/02/2017   BILITOT 0.4 11/02/2017   Lab Results  Component Value Date   CHOL 198 01/06/2016   Lab Results  Component Value Date   HDL 49.90 01/06/2016   Lab Results  Component Value Date   LDLCALC 126 (H) 03/03/2014   Lab Results  Component Value Date   TRIG 202.0 (H) 01/06/2016   Lab Results  Component Value Date   CHOLHDL 4 01/06/2016   Lab Results  Component Value Date   HGBA1C 7.0 (H) 02/07/2018    IMPRESSION AND PLAN:  1) DM 2, good control. HbA1c today. Feet exam normal today. BMET today.  2) HTN: pretty well controlled. Stop telmisartan ---question of the cause of her recent angioedema. Increase amlodipine to '10mg'$  qd. BMET today.  3) Chronic urgent diarrhea and chronic urge urinary incontinence: her GI sx's are intermittent, not resulting in any wt loss or dehydration.  At this time she elects to not pursue anymore investigations---she is coping with it for now.  4) Angioedema: ? Due to telmisartan.  Stop this  med. Monitor for recurrence. May ultimately need allergy testing if recurs and we cannot identify a cause. Signs/symptoms to call or return for were reviewed and pt expressed understanding.  5) Colon ca screening: pt had decided at one point that she did not want to do any further colon cancer screening, but today she changed her mind and told me she wanted a new cologuard kit--done.  An After Visit Summary was printed and given to the patient.  FOLLOW UP: Return for 2-3 wks f/u HTN.  Signed:  Crissie Sickles, MD  02/07/2018     

## 2018-02-12 DIAGNOSIS — Z1211 Encounter for screening for malignant neoplasm of colon: Secondary | ICD-10-CM | POA: Diagnosis not present

## 2018-02-14 ENCOUNTER — Ambulatory Visit: Payer: Self-pay

## 2018-02-14 NOTE — Telephone Encounter (Signed)
Pt c/o swelling of the left side of the upper lip. Symptoms began this morning. Pt stated the left side is twice as swollen as the right side of the upper lip. No swelling is noted to the right side of the upper lip. Pt stated the swelling feels numb. Pt was seen by PCP on 02/07/18 and telmisartan was discontinued. Pt denies any tongue or mouth swelling and stated she is having no trouble swallowing or breathing. Pt is not sure what is causing the edema. Pt denies toothache or other areas of swelling.  Care advice given and pt verbalized understanding. Pt given appointment with Dr Anitra Lauth at 10:30. Pt advised to call back if swelling worsens or spreads.  Reason for Disposition . [1] Mild lip swelling from food reaction AND [2] diagnosis never confirmed by a HCP    Pt having recurrence of lip swelling today. Unsure of cause.  Answer Assessment - Initial Assessment Questions 1. ONSET: "When did the swelling start?" (e.g., minutes, hours, days)     Today  2. LOCATION: "What part of the face is swollen?"     Upper lip on the left side 3. SEVERITY: "How swollen is it?"     Left side of lip is twice the size of the right side 4. ITCHING: "Is there any itching?" If so, ask: "How much?"   (Scale 1-10; mild, moderate or severe)     No itching on wrist  5. PAIN: "Is the swelling painful to touch?" If so, ask: "How painful is it?"   (Scale 1-10; mild, moderate or severe)     No- feels numb 6. FEVER: "Do you have a fever?" If so, ask: "What is it, how was it measured, and when did it start?"      no 7. CAUSE: "What do you think is causing the face swelling?"     Pt does not know 8. RECURRENT SYMPTOM: "Have you had face swelling before?" If so, ask: "When was the last time?" "What happened that time?"     Yes- 2 weeks ago- saw PCP and stopped one of her BP medications 9. OTHER SYMPTOMS: "Do you have any other symptoms?" (e.g., toothache, leg swelling)     no 10. PREGNANCY: "Is there any chance you are  pregnant?" "When was your last menstrual period?"       n/a  Protocols used: LIP SWELLING-A-AH, FACE [facility]

## 2018-02-15 ENCOUNTER — Ambulatory Visit (INDEPENDENT_AMBULATORY_CARE_PROVIDER_SITE_OTHER): Payer: Medicare Other | Admitting: Family Medicine

## 2018-02-15 ENCOUNTER — Encounter: Payer: Self-pay | Admitting: Family Medicine

## 2018-02-15 VITALS — BP 139/75 | HR 77 | Temp 97.8°F | Resp 16 | Ht 59.0 in | Wt 148.5 lb

## 2018-02-15 DIAGNOSIS — T783XXD Angioneurotic edema, subsequent encounter: Secondary | ICD-10-CM

## 2018-02-15 DIAGNOSIS — L509 Urticaria, unspecified: Secondary | ICD-10-CM

## 2018-02-15 MED ORDER — FEXOFENADINE HCL 180 MG PO TABS: 180.0000 mg | ORAL_TABLET | Freq: Every day | ORAL | 3 refills | Status: DC

## 2018-02-15 MED ORDER — EPINEPHRINE 0.3 MG/0.3ML IJ SOAJ: 0.3000 mg | Freq: Once | INTRAMUSCULAR | 1 refills | Status: AC

## 2018-02-15 NOTE — Progress Notes (Signed)
OFFICE VISIT  02/15/2018   CC:  Chief Complaint  Patient presents with  . Oral Swelling   HPI:    Patient is a 72 y.o. [name] who presents for upper lip swelling. Onset of upper lip swelling about 10AM yesterday when waiting for husband to get out of his colonoscopy procedure. Itching on R palm and volar surface of right forearm then soon started, then right forearm started swelling some but pt said arm swelling was really only discernable by her.  One small hive was noted on right wrist volar aspect.   Upper lip swelling was mainly left side, extended to L nostril region but did not involve bottom lip, peri-orbital region, or any other area of the face, and no neck involvement.  No tongue swelling or throat swelling. No SOB or wheezing or palpitations or diarrhea or CP or dizziness. She took an Human resources officer yesterday after getting back home in the afternoon.  Lip swelling has gone down mostly. L arm still mildly swollen. She had a URI about 5-6 days ago. No new contact irritants, foods, or meds. BPs near 628 systolic, 31D diastolic.  I saw her 02/07/18 for routine chronic illness f/u.  At that time she related a fairly recent episode (10-14d prior) of LOWER lip/one sided facial swelling. Unknown trigger.  As a precaution, I d/c'd her ARB (and increased her amlodipine to 10 mg qd). I recommended allegra daily.  Past Medical History:  Diagnosis Date  . Angioedema 01/2018   1 episode ->stopped ARB as precaution.  Then had another episode a couple weeks later.  . Beta thalassemia, heterozygous    vs alpha thalassemia minor (pt's family originally from [place]).  Marland Kitchen CAP (community acquired pneumonia) 02/2010  . Colon cancer screening    As of 06/2016, she declines any further colon cancer screening.  . Congenital sensorineural hearing loss    deaf in right ear; mild presbycusis in left ear (audiologist 2014)  . Depression   . HTN (hypertension)   . Hyperkalemia 07/17/2017   Decreased ARB dose, added low dose hctz, low K diet---repeat K normal.  . Hyperlipidemia, mixed    Refuses statins  . Hypothyroidism   . Osteopenia    DEXAs 2003-2013--pt has declined bisphosphonates  . Somnolence, daytime, controlled 04/22/2012  . Type II or unspecified type diabetes mellitus without mention of complication, uncontrolled   . Vitamin D deficiency     Past Surgical History:  Procedure Laterality Date  . BREAST BIOPSY     Left breast biopsy x 2 (fibrocystic/benign)  . COLONOSCOPY  2008   Normal.  10 yr recall (initial colonoscopy done in [place]--needs referral to local GI in 2018.  . TONSILLECTOMY AND ADENOIDECTOMY     as a child    Outpatient Medications Prior to Visit  Medication Sig Dispense Refill  . amLODipine (NORVASC) 10 MG tablet Take 1 tablet (10 mg total) by mouth daily. 30 tablet 3  . Calcium Carbonate-Vitamin D (CALCIUM 600+D) 600-400 MG-UNIT per tablet Take 1 tablet by mouth 2 (two) times daily.    . [employer] LANCETS ULTRA-THIN 30G MISC 1 Device by Other route daily. 100 each 2  . glucose blood (ONE TOUCH ULTRA TEST) test strip TEST 1 EACH BY OTHER ROUTE DAILY. DIAG E11.9 100 each 3  . hydrochlorothiazide (HYDRODIURIL) 12.5 MG tablet Take 1 tablet (12.5 mg total) by mouth daily. 90 tablet 1  . hyoscyamine (LEVSIN, ANASPAZ) 0.125 MG tablet 1-2 tabs q4h prn 30 tablet 2  . levothyroxine (  SYNTHROID, LEVOTHROID) 125 MCG tablet TAKE 1 TABLET EVERY DAY 90 tablet 0  . metFORMIN (GLUCOPHAGE) 500 MG tablet TAKE 1 TABLET TWICE DAILY 180 tablet 1  . Omega-3 Fatty Acids (FISH OIL) 1000 MG CAPS Take 1 capsule by mouth 2 (two) times daily.    Marland Kitchen aspirin EC 81 MG tablet Take 1 tablet (81 mg total) by mouth daily. (Patient taking differently: Take 81 mg by mouth every other day. ) 30 tablet 0  . telmisartan (MICARDIS) 40 MG tablet Take 1 tablet (40 mg total) by mouth daily. (Patient not taking: Reported on 02/15/2018) 90 tablet 1   No facility-administered medications  prior to visit.     No Known Allergies  ROS As per HPI  PE: Blood pressure 139/75, pulse 77, temperature 97.8 F (36.6 C), temperature source Oral, resp. rate 16, height 4\' 11"  (1.499 m), weight 148 lb 8 oz (67.4 kg), SpO2 98 %. Gen: Alert, well appearing.  Patient is oriented to person, place, time, and situation. AFFECT: pleasant, lucid thought and speech. QIW:LNLG: no injection, icteris, swelling, or exudate.  EOMI, PERRLA. Mouth: diffuse mild swelling of upper lip w/out erythema.  Oral mucosa pink and moist. Oropharynx without erythema, exudate, or swelling.  Neck - No masses or thyromegaly or limitation in range of motion CV: RRR, no m/r/g.   LUNGS: CTA bilat, nonlabored resps, good aeration in all lung fields. EXT: no clubbing or cyanosis.  no edema.   SHe has a VERY subtle increased fullness in volar soft tissues of R forearm.  No tenderness.  Distal pulse normal, color normal. SKIN: no rash.   LABS:    Chemistry      Component Value Date/Time   NA 138 02/07/2018 1017   K 3.7 02/07/2018 1017   CL 99 02/07/2018 1017   CO2 31 02/07/2018 1017   BUN 21 02/07/2018 1017   CREATININE 0.78 02/07/2018 1017      Component Value Date/Time   CALCIUM 9.7 02/07/2018 1017   ALKPHOS 60 11/02/2017 1051   AST 14 11/02/2017 1051   ALT 19 11/02/2017 1051   BILITOT 0.4 11/02/2017 1051     Lab Results  Component Value Date   HGBA1C 7.0 (H) 02/07/2018    IMPRESSION AND PLAN:  1) Angioedema (trace of urticarial rash x 1 lesion only): this is her 2nd episode. Unknown etiology-->? Postviral.  ? Idiopathic. Although she had been on ARB long before her 1st episode of angioedema, I took her off this med to be cautious. Additionally, for the same reason I will take her off of her ASA today (we discussed the latest research that has led to recommendation against use of ASA for primary CV prevention, esp in pt's >70 yrs old). Will have her start to take allegra 180mg  qd (she had NOT been  taking this at all until yesterday). Discussed increased risk of more extensive angioedema and/or anaphylaxis-->epi pen rx'd today and parameters for self administration discussed. Will ask allergist to see her to see if allergy testing and/or additional w/u would be helpful in this case-->ref to Dr. Verlin Fester with allergy and asthma clinic here at [facility].  2) HTN: she is maintaining good bp control on amlodipine 10mg  daily and 12.5mg  hctz daily. No changes today.  Lytes/cr normal 1 week ago.  An After Visit Summary was printed and given to the patient.  FOLLOW UP: Return for may cancel appt for next wednesday; reschedule as [facility] appt in 10-12 weeks.  Signed:  Crissie Sickles,  MD           02/15/2018

## 2018-02-15 NOTE — Patient Instructions (Signed)
Take one over the counter, generic allegra 180 mg tab ONCE a day EVERY DAY.

## 2018-02-20 ENCOUNTER — Other Ambulatory Visit: Payer: Self-pay

## 2018-02-20 ENCOUNTER — Encounter: Payer: Self-pay | Admitting: *Deleted

## 2018-02-20 ENCOUNTER — Encounter: Payer: Self-pay | Admitting: Family Medicine

## 2018-02-20 DIAGNOSIS — Z1211 Encounter for screening for malignant neoplasm of colon: Secondary | ICD-10-CM

## 2018-02-21 ENCOUNTER — Ambulatory Visit: Payer: Medicare Other | Admitting: Family Medicine

## 2018-02-27 ENCOUNTER — Ambulatory Visit (INDEPENDENT_AMBULATORY_CARE_PROVIDER_SITE_OTHER): Payer: Medicare Other | Admitting: Allergy and Immunology

## 2018-02-27 ENCOUNTER — Encounter: Payer: Self-pay | Admitting: Allergy and Immunology

## 2018-02-27 VITALS — BP 142/78 | HR 76 | Temp 97.8°F | Resp 14 | Ht 59.0 in | Wt 148.4 lb

## 2018-02-27 DIAGNOSIS — T7840XD Allergy, unspecified, subsequent encounter: Secondary | ICD-10-CM

## 2018-02-27 DIAGNOSIS — L500 Allergic urticaria: Secondary | ICD-10-CM | POA: Diagnosis not present

## 2018-02-27 DIAGNOSIS — L508 Other urticaria: Secondary | ICD-10-CM | POA: Diagnosis not present

## 2018-02-27 DIAGNOSIS — Z91018 Allergy to other foods: Secondary | ICD-10-CM

## 2018-02-27 DIAGNOSIS — T783XXD Angioneurotic edema, subsequent encounter: Secondary | ICD-10-CM

## 2018-02-27 DIAGNOSIS — K2970 Gastritis, unspecified, without bleeding: Secondary | ICD-10-CM | POA: Diagnosis not present

## 2018-02-27 DIAGNOSIS — T783XXA Angioneurotic edema, initial encounter: Secondary | ICD-10-CM | POA: Insufficient documentation

## 2018-02-27 NOTE — Assessment & Plan Note (Signed)
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  We were unable to skin test today due to recent administration of antihistamine.  A laboratory order form has been provided for serum specific IgE against kiwi.

## 2018-02-27 NOTE — Patient Instructions (Addendum)
Angioedema Unclear etiology.   The patient is not taking an ACE inhibitor, however had been on an ARB at the time of symptom onset.  The ARB was discontinued, however stuttering angioedema may occur during the ensuing weeks after discontinuation of an ACE inhibitor or ARB.  If this is the case, we would expect the angioedema to decrescendo and resolve during the upcoming weeks. The patient may have had episodes mild urticaria on several occasions in the past. Will order labs to rule out hereditary angioedema, acquired angioedema, urticaria associated angioedema, and other potential etiologies. For symptom relief, the patient is to take oral antihistamines as directed. However, if the underlying pathophysiology is bradykinin mediated, antihistamines will not reduce symptoms.  We were unable to perform skin tests today due to recent administration of antihistamine.   The following labs have been ordered: Antithyroglobulin antibody, thyroid peroxidase antibody, FCeRI antibody, tryptase, C4, C1 esterase inhibitor (quantitative and functional), C1q, factor XII, CBC, CMP, and galactose-alpha-1,3-galactose IgE level.  The patient will be notified with further recommendations after lab results have returned.  If lab results are unrevealing, she will return for skin testing after having been off of antihistamines for at least 3 days.  Instructions have been provided and discussed for H1/H2 receptor blockade with step-wise increase/decrease to find lowest effective dose.  Should symptoms recur, a  journal is to be kept recording any foods eaten, beverages consumed, medications taken, activities performed, and environmental conditions within a 6 hour period prior to the onset of symptoms. For any symptoms concerning for anaphylaxis, epinephrine is to be administered and 911 is to be called immediately.  History of food allergy The patient's history suggests food allergy to kiwi versus oral allergy  syndrome.  We were unable to skin test today due to recent administration of antihistamine.  A laboratory order form has been provided for serum specific IgE against kiwi.   When lab results have returned the patient will be called with further recommendations and follow up instructions.  Urticaria (Hives)  . Fexofenadine (Allegra) 180 mg once a day.  If symptoms continue then increase to .  Marland Kitchen Fexofenadine (Allegra) 180 mg  twice a day.  If symptoms continue then increase to .  Marland Kitchen Fexofenadine (Allegra) 180 mg  twice a day and famotidine (Pepcid) 20 mg once a day.  If symptoms continue then increase to.  Marland Kitchen Fexofenadine (Allegra) 180 mg  twice a day and famotidine (Pepcid) 20 mg twice a day  May use Benadryl as needed for breakthrough symptoms       If no symptoms for 7 days, then step down dosage

## 2018-02-27 NOTE — Assessment & Plan Note (Addendum)
Unclear etiology.   The patient is not taking an ACE inhibitor, however had been on an ARB at the time of symptom onset.  The ARB was discontinued, however stuttering angioedema may occur during the ensuing weeks after discontinuation of an ACE inhibitor or ARB.  If this is the case, we would expect the angioedema to decrescendo and resolve during the upcoming weeks. The patient may have had episodes mild urticaria on several occasions in the past. Will order labs to rule out hereditary angioedema, acquired angioedema, urticaria associated angioedema, and other potential etiologies. For symptom relief, the patient is to take oral antihistamines as directed. However, if the underlying pathophysiology is bradykinin mediated, antihistamines will not reduce symptoms.  We were unable to perform skin tests today due to recent administration of antihistamine.   The following labs have been ordered: Antithyroglobulin antibody, thyroid peroxidase antibody, FCeRI antibody, tryptase, C4, C1 esterase inhibitor (quantitative and functional), C1q, factor XII, CBC, CMP, and galactose-alpha-1,3-galactose IgE level.  The patient will be notified with further recommendations after lab results have returned.  If lab results are unrevealing, she will return for skin testing after having been off of antihistamines for at least 3 days.  Instructions have been provided and discussed for H1/H2 receptor blockade with step-wise increase/decrease to find lowest effective dose.  Should symptoms recur, a  journal is to be kept recording any foods eaten, beverages consumed, medications taken, activities performed, and environmental conditions within a 6 hour period prior to the onset of symptoms. For any symptoms concerning for anaphylaxis, epinephrine is to be administered and 911 is to be called immediately.

## 2018-02-27 NOTE — Progress Notes (Signed)
New Patient Note  RE: Mackenzie Wood MRN: [id number] DOB: 15-Sep-1945 Date of Office Visit: 02/27/2018  Referring provider: Tammi Sou, MD Primary care provider: Tammi Sou, MD  Chief Complaint: Angioedema/Allergic Reactions   History of present illness: Mackenzie Wood is a 72 y.o. female seen today in consultation requested by Shawnie Dapper, MD.  She reports that approximately 6 weeks ago she experienced numbness of her lower lip which progressed into lower lip swelling.  The swelling eventually extended to her chin and anterior aspect of her neck.  She did not experience concomitant urticaria, cardiopulmonary symptoms, or GI symptoms.  She took fexofenadine and the swelling resolved over the next 5 or 6 days.  No specific medication, food, skin care product, detergent, soap, or other environmental triggers have been identified.  She saw Dr. Anitra Lauth on November 13 for a routine follow-up visit and, upon learning about the episode of angioedema 10 to 14 days prior, he discontinued telmisartan and aspirin.  She returned to see him on November 21 with another episode of angioedema of the upper lip that it started the day prior.  Just prior to the onset of lip swelling on this episode she had experienced pruritus of her right palm and volar aspect of her wrist/forearm.  She noticed one small hive on her right wrist.  She did not experience concomitant cardiopulmonary or GI symptoms.  At that point, she was asked to take fexofenadine 180 mg daily, was prescribed an epinephrine autoinjector, and the referral was made for today's consultation.  Yesterday she developed lower lip swelling, right eyelid swelling, and believes that the volar aspect of her forearms "became puffy."  She denies associated symptoms concerning for anaphylaxis.  On questioning, she admits to developing occasional "itchy spots and little random hives" without identified triggers.  She has no history of symptoms  consistent with asthma or eczema.  She notes that she had experienced pharyngeal pruritus with the consumption of kiwi on 2 or 3 occasions approximately 15 years ago.  She has avoided kiwi since that time.  Assessment and plan: Angioedema Unclear etiology.   The patient is not taking an ACE inhibitor, however had been on an ARB at the time of symptom onset.  The ARB was discontinued, however stuttering angioedema may occur during the ensuing weeks after discontinuation of an ACE inhibitor or ARB.  If this is the case, we would expect the angioedema to decrescendo and resolve during the upcoming weeks. The patient may have had episodes mild urticaria on several occasions in the past. Will order labs to rule out hereditary angioedema, acquired angioedema, urticaria associated angioedema, and other potential etiologies. For symptom relief, the patient is to take oral antihistamines as directed. However, if the underlying pathophysiology is bradykinin mediated, antihistamines will not reduce symptoms.  We were unable to perform skin tests today due to recent administration of antihistamine.   The following labs have been ordered: Antithyroglobulin antibody, thyroid peroxidase antibody, FCeRI antibody, tryptase, C4, C1 esterase inhibitor (quantitative and functional), C1q, factor XII, CBC, CMP, and galactose-alpha-1,3-galactose IgE level.  The patient will be notified with further recommendations after lab results have returned.  If lab results are unrevealing, she will return for skin testing after having been off of antihistamines for at least 3 days.  Instructions have been provided and discussed for H1/H2 receptor blockade with step-wise increase/decrease to find lowest effective dose.  Should symptoms recur, a  journal is to be kept recording any foods eaten, beverages  consumed, medications taken, activities performed, and environmental conditions within a 6 hour period prior to the onset of  symptoms. For any symptoms concerning for anaphylaxis, epinephrine is to be administered and 911 is to be called immediately.  History of food allergy The patient's history suggests food allergy to kiwi versus oral allergy syndrome.  We were unable to skin test today due to recent administration of antihistamine.  A laboratory order form has been provided for serum specific IgE against kiwi.   No orders of the defined types were placed in this encounter.   Diagnostics: Allergy skin testing: We were unable to perform skin tests today due to recent administration of antihistamine.  Labs have been ordered.    Physical examination: Blood pressure (!) 142/78, pulse 76, temperature 97.8 F (36.6 C), temperature source Oral, resp. rate 14, height 4\' 11"  (1.499 m), weight 148 lb 6.4 oz (67.3 kg), SpO2 96 %.  General: Alert, interactive, in no acute distress. HEENT: TMs pearly gray, turbinates mildly edematous without discharge, post-pharynx mildly erythematous. Mild edema of the lower lip. Neck: Supple without lymphadenopathy. Lungs: Clear to auscultation without wheezing, rhonchi or rales. CV: Normal S1, S2 without murmurs. Abdomen: Nondistended, nontender. Skin: Warm and dry, without lesions or rashes. Extremities:  No clubbing, cyanosis or edema. Neuro:   Grossly intact.  Review of systems:  Review of systems negative except as noted in HPI / PMHx or noted below: Review of Systems  Constitutional: Negative.   HENT: Negative.   Eyes: Negative.   Respiratory: Negative.   Cardiovascular: Negative.   Gastrointestinal: Negative.   Genitourinary: Negative.   Musculoskeletal: Negative.   Skin: Negative.   Neurological: Negative.   Endo/Heme/Allergies: Negative.   Psychiatric/Behavioral: Negative.     Past medical history:  Past Medical History:  Diagnosis Date  . Angioedema 01/2018   1 episode ->stopped ARB as precaution.  Then had another episode a couple weeks later.  .  Beta thalassemia, heterozygous    vs alpha thalassemia minor (pt's family originally from [place]).  Marland Kitchen CAP (community acquired pneumonia) 02/2010  . Colon cancer screening    Pt declines colonoscopy.  Cologuard negative 01/2018-->repeat 3 yrs.  . Congenital sensorineural hearing loss    deaf in right ear; mild presbycusis in left ear (audiologist 2014)  . Depression   . HTN (hypertension)   . Hyperkalemia 07/17/2017   Decreased ARB dose, added low dose hctz, low K diet---repeat K normal.  . Hyperlipidemia, mixed    Refuses statins  . Hypothyroidism   . Osteopenia    DEXAs 2003-2013--pt has declined bisphosphonates  . Somnolence, daytime, controlled 04/22/2012  . Type II or unspecified type diabetes mellitus without mention of complication, uncontrolled   . Vitamin D deficiency     Past surgical history:  Past Surgical History:  Procedure Laterality Date  . BREAST BIOPSY     Left breast biopsy x 2 (fibrocystic/benign)  . COLONOSCOPY  2008   Normal.  10 yr recall (initial colonoscopy done in [place]--needs referral to local GI in 2018.  . TONSILLECTOMY AND ADENOIDECTOMY     as a child    Family history: Family History  Problem Relation Age of Onset  . Hypertension Mother   . Hypertension Father     Social history: Social History   Socioeconomic History  . Marital status: Married    Spouse name: Not on file  . Number of children: Not on file  . Years of education: Not on file  . Highest education  level: Not on file  Occupational History  . Not on file  Social Needs  . Financial resource strain: Not on file  . Food insecurity:    Worry: Not on file    Inability: Not on file  . Transportation needs:    Medical: Not on file    Non-medical: Not on file  Tobacco Use  . Smoking status: Never Smoker  . Smokeless tobacco: Never Used  Substance and Sexual Activity  . Alcohol use: No  . Drug use: No  . Sexual activity: Not on file  Lifestyle  . Physical activity:     Days per week: Not on file    Minutes per session: Not on file  . Stress: Not on file  Relationships  . Social connections:    Talks on phone: Not on file    Gets together: Not on file    Attends religious service: Not on file    Active member of club or organization: Not on file    Attends meetings of clubs or organizations: Not on file    Relationship status: Not on file  . Intimate partner violence:    Fear of current or ex partner: Not on file    Emotionally abused: Not on file    Physically abused: Not on file    Forced sexual activity: Not on file  Other Topics Concern  . Not on file  Social History Narrative   Married, 2 daughters, 3 grandchildren.   Retired Archivist.  Relocated from [place] around 2010.   No T/A/Ds.  Exercise: sedentary life + no formal exercise at all.   Diet: regular            Environmental History: The patient lives in a 74 year old house with hardwood floors throughout and central air/heat.  She is a non-smoker without pets.  There is no known mold/water damage in the home.  Allergies as of 02/27/2018   No Known Allergies     Medication List        Accurate as of 02/27/18 10:55 AM. Always use your most recent med list.          amLODipine 10 MG tablet Commonly known as:  NORVASC Take 1 tablet (10 mg total) by mouth daily.   CALCIUM 600+D 600-400 MG-UNIT tablet Generic drug:  Calcium Carbonate-Vitamin D Take 1 tablet by mouth 2 (two) times daily.   [facility] LANCETS ULTRA-THIN 30G Misc 1 Device by Other route daily.   EPINEPHrine 0.3 mg/0.3 mL Soaj injection Commonly known as:  EPI-PEN INJECT 0.3 MLS (0.3 MG TOTAL) INTO THE MUSCLE ONCE FOR 1 DOSE.   fexofenadine 180 MG tablet Commonly known as:  ALLEGRA Take 1 tablet (180 mg total) by mouth daily.   Fish Oil 1000 MG Caps Take 1 capsule by mouth 2 (two) times daily.   glucose blood test strip TEST 1 EACH BY OTHER ROUTE DAILY. DIAG E11.9   hydrochlorothiazide 12.5 MG  tablet Commonly known as:  HYDRODIURIL Take 1 tablet (12.5 mg total) by mouth daily.   hyoscyamine 0.125 MG tablet Commonly known as:  LEVSIN, ANASPAZ 1-2 tabs q4h prn   levothyroxine 125 MCG tablet Commonly known as:  SYNTHROID, LEVOTHROID TAKE 1 TABLET EVERY DAY   metFORMIN 500 MG tablet Commonly known as:  GLUCOPHAGE TAKE 1 TABLET TWICE DAILY       Known medication allergies: No Known Allergies  I appreciate the opportunity to take part in Mackenzie Wood's care. Please do not hesitate to contact  me with questions.  Sincerely,   R. Edgar Frisk, MD

## 2018-03-01 LAB — H. PYLORI BREATH TEST: H pylori Breath Test: NEGATIVE

## 2018-03-07 ENCOUNTER — Encounter: Payer: Self-pay | Admitting: Family Medicine

## 2018-03-07 LAB — CBC WITH DIFFERENTIAL
Basophils Absolute: 0 10*3/uL (ref 0.0–0.2)
Basos: 0 %
EOS (ABSOLUTE): 0.2 10*3/uL (ref 0.0–0.4)
Eos: 3 %
Hematocrit: 39.2 % (ref 34.0–46.6)
Hemoglobin: 12.5 g/dL (ref 11.1–15.9)
Immature Grans (Abs): 0 10*3/uL (ref 0.0–0.1)
Immature Granulocytes: 0 %
Lymphocytes Absolute: 2.1 10*3/uL (ref 0.7–3.1)
Lymphs: 30 %
MCH: 20.1 pg — ABNORMAL LOW (ref 26.6–33.0)
MCHC: 31.9 g/dL (ref 31.5–35.7)
MCV: 63 fL — ABNORMAL LOW (ref 79–97)
Monocytes Absolute: 0.5 10*3/uL (ref 0.1–0.9)
Monocytes: 7 %
Neutrophils Absolute: 4 10*3/uL (ref 1.4–7.0)
Neutrophils: 60 %
RBC: 6.23 x10E6/uL — ABNORMAL HIGH (ref 3.77–5.28)
RDW: 17.4 % — ABNORMAL HIGH (ref 12.3–15.4)
WBC: 6.8 10*3/uL (ref 3.4–10.8)

## 2018-03-07 LAB — COMPREHENSIVE METABOLIC PANEL
ALT: 24 IU/L (ref 0–32)
AST: 17 IU/L (ref 0–40)
Albumin/Globulin Ratio: 2.1 (ref 1.2–2.2)
Albumin: 5.1 g/dL — ABNORMAL HIGH (ref 3.5–4.8)
Alkaline Phosphatase: 75 IU/L (ref 39–117)
BUN/Creatinine Ratio: 26 (ref 12–28)
BUN: 19 mg/dL (ref 8–27)
Bilirubin Total: <0.2 mg/dL (ref 0.0–1.2)
CO2: 28 mmol/L (ref 20–29)
Calcium: 9.8 mg/dL (ref 8.7–10.3)
Chloride: 95 mmol/L — ABNORMAL LOW (ref 96–106)
Creatinine, Ser: 0.73 mg/dL (ref 0.57–1.00)
GFR calc Af Amer: 94 mL/min/{1.73_m2} (ref >59)
GFR calc non Af Amer: 81 mL/min/{1.73_m2} (ref >59)
Globulin, Total: 2.4 g/dL (ref 1.5–4.5)
Glucose: 167 mg/dL — ABNORMAL HIGH (ref 65–99)
Potassium: 3.7 mmol/L (ref 3.5–5.2)
Sodium: 139 mmol/L (ref 134–144)
Total Protein: 7.5 g/dL (ref 6.0–8.5)

## 2018-03-07 LAB — ALPHA-GAL PANEL
Alpha Gal IgE*: <0.1 kU/L (ref <0.10)
Beef (Bos spp) IgE: <0.1 kU/L (ref <0.35)
Class Interpretation: 0
Class Interpretation: 0
Class Interpretation: 0
Lamb/Mutton (Ovis spp) IgE: <0.1 kU/L (ref <0.35)
Pork (Sus spp) IgE: <0.1 kU/L (ref <0.35)

## 2018-03-07 LAB — THYROID ANTIBODIES
Thyroglobulin Antibody: 2 IU/mL — ABNORMAL HIGH (ref 0.0–0.9)
Thyroperoxidase Ab SerPl-aCnc: 64 IU/mL — ABNORMAL HIGH (ref 0–34)

## 2018-03-07 LAB — TRYPTASE: Tryptase: 5.2 ug/L (ref 2.2–13.2)

## 2018-03-07 LAB — FACTOR 12 ASSAY: Factor XII Activity: 108 % (ref 50–150)

## 2018-03-07 LAB — COMPLEMENT COMPONENT C1Q: Complement C1Q: 11.6 mg/dL (ref 10.3–20.5)

## 2018-03-07 LAB — ALLERGEN, KIWI FRUIT, F84: Kiwi Fruit: 0.37 kU/L — AB

## 2018-03-07 LAB — C1 ESTERASE INHIBITOR, FUNCTIONAL: C1INH Functional/C1INH Total MFr SerPl: 83 %mean normal

## 2018-03-07 LAB — C4 COMPLEMENT: Complement C4, Serum: 43 mg/dL (ref 14–44)

## 2018-03-07 LAB — CHRONIC URTICARIA: cu index: >50 — ABNORMAL HIGH (ref <10)

## 2018-03-08 ENCOUNTER — Encounter: Payer: Self-pay | Admitting: Family Medicine

## 2018-04-25 ENCOUNTER — Telehealth: Payer: Self-pay

## 2018-04-25 NOTE — Telephone Encounter (Signed)
Candice did they mention which labs they were referring to with the genetic marker? Please advise

## 2018-04-25 NOTE — Telephone Encounter (Signed)
Patient went to have her labs drawn, [employer] stated some of her test would not be covered. They told her she had to pay upfront and then she could appeal her insurance. She paid and has now called her insurance for which they are stating the test are covered and that we had gave an incorrect diagnoses code. The test are cell function and genic marker. Please advise

## 2018-04-25 NOTE — Telephone Encounter (Signed)
Please inform the patient and [facility] that the CU Index (aka, cell function assay) is the very lab that gave [place] the diagnosis of autoimmune urticaria. Perhaps we need to put in a diagnosis code for autoimmune urticaria. Which labs are they referring to the genetic marker (C1 esterase inhibitor?). Thanks.

## 2018-04-25 NOTE — Telephone Encounter (Signed)
No she did not say nor did I ask. Should I give her a call back? Not sure if the patient would know or if we should call [facility].

## 2018-04-27 NOTE — Telephone Encounter (Signed)
Called and spoke with patient.  Need to confirm if it is C1q and/or C1 esterase that [facility] needs additional diagnosis.  Patient will look for her copy of her bill from [place] and call me back so I can proceed forward.

## 2018-04-27 NOTE — Telephone Encounter (Signed)
Spoke with patient and she was unsure of the lab test, the only thing that was told to her were the Cell function and genetic marker test were cover but had incorrect diagnosis codes. I have spoke with Beth to see if she could help look into this issue.

## 2018-04-30 NOTE — Telephone Encounter (Signed)
Patient called back and she did not have a copy of her bill.  I called [facility] billing and spoke with Caryl Pina who stated that Medicare did deny Tryptase and Chronic Urticaria panel and needs additional diagnosis.  Reference Invoice # [id number] when calling [facility] billing department back at [phone] with additional information.

## 2018-05-01 ENCOUNTER — Ambulatory Visit (INDEPENDENT_AMBULATORY_CARE_PROVIDER_SITE_OTHER): Payer: Medicare Other | Admitting: Family Medicine

## 2018-05-01 ENCOUNTER — Encounter: Payer: Self-pay | Admitting: Family Medicine

## 2018-05-01 VITALS — BP 127/78 | HR 76 | Temp 98.0°F | Resp 16 | Ht 59.0 in | Wt 145.0 lb

## 2018-05-01 DIAGNOSIS — I10 Essential (primary) hypertension: Secondary | ICD-10-CM | POA: Diagnosis not present

## 2018-05-01 DIAGNOSIS — E119 Type 2 diabetes mellitus without complications: Secondary | ICD-10-CM

## 2018-05-01 MED ORDER — HYDROCHLOROTHIAZIDE 25 MG PO TABS: 25.0000 mg | ORAL_TABLET | Freq: Every day | ORAL | 1 refills | Status: DC

## 2018-05-01 NOTE — Patient Instructions (Signed)
We increased your hydrochlorothiazide dose today to 25mg  once daily. I sent prescription for this pill to your mail order pharmacy today.  Check blood pressure and heart rate about 10 times over the next month and bring numbers in to next office visit to review with me.

## 2018-05-01 NOTE — Progress Notes (Signed)
OFFICE VISIT  05/01/2018   CC:  Chief Complaint  Patient presents with  . Follow-up    HTN, pt is not fasting.    HPI:    Patient is a 73 y.o. [name] who presents for f/u HTN and DM 2 Feeling fine, no acute complaints. Home bp monitoring shows avg 140 -145 over 70-80.  No lows.  Nothing over 150 syst or over 90 diast.  Home fasting glucoses: avg 140s last couple months. This is a bit higher than months before.  She had some stress over the holidays and didn't eat like normal either. Activity level lower in cold weather.   Past Medical History:  Diagnosis Date  . Angioedema 01/2018   Autoimmune urticaria/angioedema-->needs chronic nonsedating antihistamine and an H2 blocker indefinitely.  1 episode ->ASA and ARB d/c'd as precaution.  Then had another episode a couple weeks later.  Another episode just prior to seen allergist--allergist doing blood workup and likely skin testing as well.  . Beta thalassemia, heterozygous    vs alpha thalassemia minor (pt's family originally from [place]).  Marland Kitchen CAP (community acquired pneumonia) 02/2010  . Colon cancer screening    Pt declines colonoscopy.  Cologuard negative 01/2018-->repeat 3 yrs.  . Congenital sensorineural hearing loss    deaf in right ear; mild presbycusis in left ear (audiologist 2014)  . Depression   . HTN (hypertension)   . Hyperkalemia 07/17/2017   Decreased ARB dose, added low dose hctz, low K diet---repeat K normal.  . Hyperlipidemia, mixed    Refuses statins  . Hypothyroidism   . Osteopenia    DEXAs 2003-2013--pt has declined bisphosphonates  . Somnolence, daytime, controlled 04/22/2012  . Type II or unspecified type diabetes mellitus without mention of complication, uncontrolled   . Vitamin D deficiency     Past Surgical History:  Procedure Laterality Date  . BREAST BIOPSY     Left breast biopsy x 2 (fibrocystic/benign)  . COLONOSCOPY  2008   Normal.  10 yr recall (initial colonoscopy done in  [place]--needs referral to local GI in 2018.  . TONSILLECTOMY AND ADENOIDECTOMY     as a child    Outpatient Medications Prior to Visit  Medication Sig Dispense Refill  . amLODipine (NORVASC) 10 MG tablet Take 1 tablet (10 mg total) by mouth daily. 30 tablet 3  . Calcium Carbonate-Vitamin D (CALCIUM 600+D) 600-400 MG-UNIT per tablet Take 1 tablet by mouth 2 (two) times daily.    . [employer] LANCETS ULTRA-THIN 30G MISC 1 Device by Other route daily. 100 each 2  . EPINEPHrine 0.3 mg/0.3 mL IJ SOAJ injection INJECT 0.3 MLS (0.3 MG TOTAL) INTO THE MUSCLE ONCE FOR 1 DOSE.  1  . fexofenadine (ALLEGRA) 180 MG tablet Take 1 tablet (180 mg total) by mouth daily. 90 tablet 3  . glucose blood (ONE TOUCH ULTRA TEST) test strip TEST 1 EACH BY OTHER ROUTE DAILY. DIAG E11.9 100 each 3  . hyoscyamine (LEVSIN, ANASPAZ) 0.125 MG tablet 1-2 tabs q4h prn 30 tablet 2  . levothyroxine (SYNTHROID, LEVOTHROID) 125 MCG tablet TAKE 1 TABLET EVERY DAY 90 tablet 0  . metFORMIN (GLUCOPHAGE) 500 MG tablet TAKE 1 TABLET TWICE DAILY 180 tablet 1  . Omega-3 Fatty Acids (FISH OIL) 1000 MG CAPS Take 1 capsule by mouth 2 (two) times daily.    . hydrochlorothiazide (HYDRODIURIL) 12.5 MG tablet Take 1 tablet (12.5 mg total) by mouth daily. 90 tablet 1   No facility-administered medications prior to visit.  No Known Allergies  ROS As per HPI  PE: Blood pressure 127/78, pulse 76, temperature 98 F (36.7 C), temperature source Oral, resp. rate 16, height 4\' 11"  (1.499 m), weight 145 lb (65.8 kg), SpO2 97 %. Gen: Alert, well appearing.  Patient is oriented to person, place, time, and situation. AFFECT: pleasant, lucid thought and speech. CV: RRR, no m/r/g.   LUNGS: CTA bilat, nonlabored resps, good aeration in all lung fields. EXT: no clubbing or cyanosis.  no edema.    LABS:  Lab Results  Component Value Date   TSH 2.61 02/07/2018   Lab Results  Component Value Date   WBC 6.8 02/27/2018   HGB 12.5 02/27/2018    HCT 39.2 02/27/2018   MCV 63 (L) 02/27/2018   PLT 250.0 07/17/2017   Lab Results  Component Value Date   CREATININE 0.73 02/27/2018   BUN 19 02/27/2018   NA 139 02/27/2018   K 3.7 02/27/2018   CL 95 (L) 02/27/2018   CO2 28 02/27/2018   Lab Results  Component Value Date   ALT 24 02/27/2018   AST 17 02/27/2018   ALKPHOS 75 02/27/2018   BILITOT <0.2 02/27/2018   Lab Results  Component Value Date   CHOL 198 01/06/2016   Lab Results  Component Value Date   HDL 49.90 01/06/2016   Lab Results  Component Value Date   LDLCALC 126 (H) 03/03/2014   Lab Results  Component Value Date   TRIG 202.0 (H) 01/06/2016   Lab Results  Component Value Date   CHOLHDL 4 01/06/2016   Lab Results  Component Value Date   HGBA1C 7.0 (H) 02/07/2018   A1c's stable (6.7% to 7.0% over the last 18+ months).  IMPRESSION AND PLAN:  1) HTN: uncontrolled but not too bad. Increase hctz to 25mg  once a day. Continue amlodipine 10mg  qd. Low dose ARB or ACE are now an option as long as we watch K closely (her angioedema eval showed autoimmune etiology). BMET at next f/u in 1 mo.  2) DM 2, control not ideal. Too early for A1c recheck. No DM med changes at this time. Recheck A1c at next f/u in 1 mo.  An After Visit Summary was printed and given to the patient.  FOLLOW UP: Return in about 4 weeks (around 05/29/2018) for f/u HTN and DM 2 (fasting not necessary).  Signed:  Crissie Sickles, MD           05/01/2018

## 2018-05-02 NOTE — Telephone Encounter (Signed)
[facility] [employer] and spoke with [place].  Gave additional diagnosis codes of L50.8 and K29.70 to add to lab billing for Tryptase and Chronic Urticaria Panel.  Per Daleen Snook lab claim will be filed again and will take 6-8 weeks for claim to be reprocessed. Called patient and informed her of call to [facility] and claim being reprocessed with additional diagnosis.

## 2018-05-07 ENCOUNTER — Other Ambulatory Visit: Payer: Self-pay | Admitting: Family Medicine

## 2018-05-08 ENCOUNTER — Other Ambulatory Visit: Payer: Self-pay | Admitting: *Deleted

## 2018-05-08 MED ORDER — AMLODIPINE BESYLATE 10 MG PO TABS: 10.0000 mg | ORAL_TABLET | Freq: Every day | ORAL | 1 refills | Status: DC

## 2018-05-28 ENCOUNTER — Other Ambulatory Visit: Payer: Self-pay | Admitting: Family Medicine

## 2018-05-28 DIAGNOSIS — E119 Type 2 diabetes mellitus without complications: Secondary | ICD-10-CM

## 2018-05-28 NOTE — Telephone Encounter (Signed)
Pt has f/u for DM today, will hold off on refill for now.

## 2018-05-29 ENCOUNTER — Ambulatory Visit (INDEPENDENT_AMBULATORY_CARE_PROVIDER_SITE_OTHER): Payer: Medicare Other | Admitting: Family Medicine

## 2018-05-29 ENCOUNTER — Encounter: Payer: Self-pay | Admitting: Family Medicine

## 2018-05-29 VITALS — BP 139/71 | HR 76 | Temp 97.8°F | Resp 16 | Ht 59.0 in | Wt 145.4 lb

## 2018-05-29 DIAGNOSIS — E119 Type 2 diabetes mellitus without complications: Secondary | ICD-10-CM

## 2018-05-29 DIAGNOSIS — I10 Essential (primary) hypertension: Secondary | ICD-10-CM | POA: Diagnosis not present

## 2018-05-29 MED ORDER — LISINOPRIL 5 MG PO TABS: 5.0000 mg | ORAL_TABLET | Freq: Every day | ORAL | 0 refills | Status: DC

## 2018-05-29 NOTE — Progress Notes (Signed)
OFFICE VISIT  05/29/2018   CC:  Chief Complaint  Patient presents with  . Follow-up    RCI, pt is not fasting     HPI:    Patient is a 73 y.o. [name] who presents for 1 mo f/u DM and HTN.  HTN: last visit I increased her HCTZ to 25mg  qd and kept other bp med the same. BP's still 145-150 avg at home, diastolics 96G-83M.   DM: glucoses--> has been out of metformin x 3-4d. Avg 140s, creeping up.  Says diet hasn't changed.  Not very active other than cleaning house.  ROS: no CP, no SOB, no wheezing, no cough, no dizziness, no HAs, no rashes, no melena/hematochezia.  No polyuria or polydipsia.  No myalgias or arthralgias.   Past Medical History:  Diagnosis Date  . Angioedema 01/2018   Autoimmune urticaria/angioedema-->needs chronic nonsedating antihistamine and an H2 blocker indefinitely.  1 episode ->ASA and ARB d/c'd as precaution.  Then had another episode a couple weeks later.  Another episode just prior to seen allergist--allergist doing blood workup and likely skin testing as well.  . Beta thalassemia, heterozygous    vs alpha thalassemia minor (pt's family originally from [place]).  Marland Kitchen CAP (community acquired pneumonia) 02/2010  . Colon cancer screening    Pt declines colonoscopy.  Cologuard negative 01/2018-->repeat 3 yrs.  . Congenital sensorineural hearing loss    deaf in right ear; mild presbycusis in left ear (audiologist 2014)  . Depression   . HTN (hypertension)   . Hyperkalemia 07/17/2017   Decreased ARB dose, added low dose hctz, low K diet---repeat K normal.  . Hyperlipidemia, mixed    Refuses statins  . Hypothyroidism   . Osteopenia    DEXAs 2003-2013--pt has declined bisphosphonates  . Somnolence, daytime, controlled 04/22/2012  . Type II or unspecified type diabetes mellitus without mention of complication, uncontrolled   . Vitamin D deficiency     Past Surgical History:  Procedure Laterality Date  . BREAST BIOPSY     Left breast biopsy x 2  (fibrocystic/benign)  . COLONOSCOPY  2008   Normal.  10 yr recall (initial colonoscopy done in [place]--needs referral to local GI in 2018.  . TONSILLECTOMY AND ADENOIDECTOMY     as a child    Outpatient Medications Prior to Visit  Medication Sig Dispense Refill  . amLODipine (NORVASC) 10 MG tablet Take 1 tablet (10 mg total) by mouth daily. 90 tablet 1  . Calcium Carbonate-Vitamin D (CALCIUM 600+D) 600-400 MG-UNIT per tablet Take 1 tablet by mouth 2 (two) times daily.    . [employer] LANCETS ULTRA-THIN 30G MISC 1 Device by Other route daily. 100 each 2  . EPINEPHrine 0.3 mg/0.3 mL IJ SOAJ injection INJECT 0.3 MLS (0.3 MG TOTAL) INTO THE MUSCLE ONCE FOR 1 DOSE.  1  . fexofenadine (ALLEGRA) 180 MG tablet Take 1 tablet (180 mg total) by mouth daily. 90 tablet 3  . glucose blood (ONE TOUCH ULTRA TEST) test strip TEST 1 EACH BY OTHER ROUTE DAILY. DIAG E11.9 100 each 3  . hydrochlorothiazide (HYDRODIURIL) 25 MG tablet Take 1 tablet (25 mg total) by mouth daily. 90 tablet 1  . levothyroxine (SYNTHROID, LEVOTHROID) 125 MCG tablet TAKE 1 TABLET EVERY DAY 90 tablet 1  . metFORMIN (GLUCOPHAGE) 500 MG tablet TAKE 1 TABLET TWICE DAILY 180 tablet 1  . Omega-3 Fatty Acids (FISH OIL) 1000 MG CAPS Take 1 capsule by mouth 2 (two) times daily.    . hyoscyamine (LEVSIN, ANASPAZ)  0.125 MG tablet 1-2 tabs q4h prn (Patient not taking: Reported on 05/29/2018) 30 tablet 2   No facility-administered medications prior to visit.     No Known Allergies  ROS As per HPI  PE: Blood pressure 139/71, pulse 76, temperature 97.8 F (36.6 C), temperature source Oral, resp. rate 16, height 4\' 11"  (1.499 m), weight 145 lb 6.4 oz (66 kg), SpO2 96 %. Gen: Alert, well appearing.  Patient is oriented to person, place, time, and situation. AFFECT: pleasant, lucid thought and speech. CV: RRR, no m/r/g.   LUNGS: CTA bilat, nonlabored resps, good aeration in all lung fields. EXT: no clubbing or cyanosis.  no edema.    LABS:   Lab Results  Component Value Date   HGBA1C 7.0 (H) 02/07/2018     Chemistry      Component Value Date/Time   NA 139 02/27/2018 1314   K 3.7 02/27/2018 1314   CL 95 (L) 02/27/2018 1314   CO2 28 02/27/2018 1314   BUN 19 02/27/2018 1314   CREATININE 0.73 02/27/2018 1314      Component Value Date/Time   CALCIUM 9.8 02/27/2018 1314   ALKPHOS 75 02/27/2018 1314   AST 17 02/27/2018 1314   ALT 24 02/27/2018 1314   BILITOT <0.2 02/27/2018 1314     Lab Results  Component Value Date   CHOL 198 01/06/2016   HDL 49.90 01/06/2016   LDLCALC 126 (H) 03/03/2014   LDLDIRECT 126.0 01/06/2016   TRIG 202.0 (H) 01/06/2016   CHOLHDL 4 01/06/2016   Lab Results  Component Value Date   TSH 2.61 02/07/2018   IMPRESSION AND PLAN:  1) HTN, not well controlled. Add lisinopril 5mg  qd (hx of angioedema/urticaria of autoimmune etilogy, NOT from ACE-I). Hx of hyperkalemia-->monitor K closely. BMET today. Continue other bp meds as-is.  2) DM 2, not ideal control but fair. HbA1c and urine microalb/cr today. Needs to get more active as warm weather comes/stays.  An After Visit Summary was printed and given to the patient.  FOLLOW UP: Return in about 2 weeks (around 06/12/2018) for f/u HTN/bmet.  Signed:  Crissie Sickles, MD           05/29/2018

## 2018-05-30 ENCOUNTER — Telehealth: Payer: Self-pay

## 2018-05-30 LAB — BASIC METABOLIC PANEL
BUN: 18 mg/dL (ref 7–25)
CO2: 29 mmol/L (ref 20–32)
Calcium: 9.5 mg/dL (ref 8.6–10.4)
Chloride: 98 mmol/L (ref 98–110)
Creat: 0.77 mg/dL (ref 0.60–0.93)
Glucose, Bld: 188 mg/dL — ABNORMAL HIGH (ref 65–99)
Potassium: 3.8 mmol/L (ref 3.5–5.3)
Sodium: 141 mmol/L (ref 135–146)

## 2018-05-30 LAB — MICROALBUMIN / CREATININE URINE RATIO
CREATININE, U: 52.6 mg/dL
Microalb Creat Ratio: 1.3 mg/g (ref 0.0–30.0)

## 2018-05-30 LAB — HEMOGLOBIN A1C
Hgb A1c MFr Bld: 7.1 % of total Hgb — ABNORMAL HIGH (ref <5.7)
Mean Plasma Glucose: 157 (calc)
eAG (mmol/L): 8.7 (calc)

## 2018-05-30 NOTE — Telephone Encounter (Signed)
Lmom on patient home VM advising of lab results.

## 2018-05-30 NOTE — Addendum Note (Signed)
Addended by: Onalee Hua on: 05/30/2018 09:49 AM   Modules accepted: Orders

## 2018-05-30 NOTE — Telephone Encounter (Signed)
-----   Message from Tammi Sou, MD sent at 05/30/2018  7:48 AM EST ----- All labs excellent. HbA1c stable at 7.1%. No new recommendations-thx

## 2018-05-31 ENCOUNTER — Encounter: Payer: Self-pay | Admitting: *Deleted

## 2018-06-15 ENCOUNTER — Other Ambulatory Visit: Payer: Self-pay

## 2018-06-15 ENCOUNTER — Encounter: Payer: Self-pay | Admitting: Family Medicine

## 2018-06-15 ENCOUNTER — Ambulatory Visit (INDEPENDENT_AMBULATORY_CARE_PROVIDER_SITE_OTHER): Payer: Medicare Other | Admitting: Family Medicine

## 2018-06-15 ENCOUNTER — Telehealth: Payer: Self-pay

## 2018-06-15 VITALS — BP 113/71 | HR 72 | Temp 97.8°F | Resp 16 | Ht 59.0 in | Wt 144.8 lb

## 2018-06-15 DIAGNOSIS — Z8639 Personal history of other endocrine, nutritional and metabolic disease: Secondary | ICD-10-CM

## 2018-06-15 DIAGNOSIS — I10 Essential (primary) hypertension: Secondary | ICD-10-CM

## 2018-06-15 LAB — BASIC METABOLIC PANEL
BUN: 24 mg/dL — ABNORMAL HIGH (ref 6–23)
CO2: 29 mEq/L (ref 19–32)
Calcium: 9.7 mg/dL (ref 8.4–10.5)
Chloride: 98 mEq/L (ref 96–112)
Creatinine, Ser: 0.76 mg/dL (ref 0.40–1.20)
GFR: 74.2 mL/min (ref >60.00)
Glucose, Bld: 110 mg/dL — ABNORMAL HIGH (ref 70–99)
Potassium: 3.8 mEq/L (ref 3.5–5.1)
SODIUM: 138 meq/L (ref 135–145)

## 2018-06-15 NOTE — Telephone Encounter (Signed)
-----   Message from Tammi Sou, MD sent at 06/15/2018  4:49 PM EDT ----- Please notify: all labs came back normal.

## 2018-06-15 NOTE — Progress Notes (Signed)
OFFICE VISIT  06/15/2018   CC:  Chief Complaint  Patient presents with  . Follow-up    Hypertension   HPI:    Patient is a 73 y.o. [name] who presents for 2 week f/u HTN. I added lisinopril 5mg  qd last visit.  Has hx of hyperkalemia so we have to monitor her K closely.  Interim hx: Feeling well. Home bp's still avg low 140s over low 80s, but a fair number of measurements are 120s/70s. Highest 150-160 syst. Not exercising. Trying to eat healthy diet.  Past Medical History:  Diagnosis Date  . Angioedema 01/2018   Autoimmune urticaria/angioedema-->needs chronic nonsedating antihistamine and an H2 blocker indefinitely.  1 episode ->ASA and ARB d/c'd as precaution.  Then had another episode a couple weeks later.  Another episode just prior to seen allergist--allergist doing blood workup and likely skin testing as well.  . Beta thalassemia, heterozygous    vs alpha thalassemia minor (pt's family originally from [place]).  Marland Kitchen CAP (community acquired pneumonia) 02/2010  . Colon cancer screening    Pt declines colonoscopy.  Cologuard negative 01/2018-->repeat 3 yrs.  . Congenital sensorineural hearing loss    deaf in right ear; mild presbycusis in left ear (audiologist 2014)  . Depression   . HTN (hypertension)   . Hyperkalemia 07/17/2017   Decreased ARB dose, added low dose hctz, low K diet---repeat K normal.  . Hyperlipidemia, mixed    Refuses statins  . Hypothyroidism   . Osteopenia    DEXAs 2003-2013--pt has declined bisphosphonates  . Somnolence, daytime, controlled 04/22/2012  . Type II or unspecified type diabetes mellitus without mention of complication, uncontrolled   . Vitamin D deficiency     Past Surgical History:  Procedure Laterality Date  . BREAST BIOPSY     Left breast biopsy x 2 (fibrocystic/benign)  . COLONOSCOPY  2008   Normal.  10 yr recall (initial colonoscopy done in [place]--needs referral to local GI in 2018.  . TONSILLECTOMY AND  ADENOIDECTOMY     as a child    Outpatient Medications Prior to Visit  Medication Sig Dispense Refill  . amLODipine (NORVASC) 10 MG tablet Take 1 tablet (10 mg total) by mouth daily. 90 tablet 1  . Calcium Carbonate-Vitamin D (CALCIUM 600+D) 600-400 MG-UNIT per tablet Take 1 tablet by mouth 2 (two) times daily.    . [employer] LANCETS ULTRA-THIN 30G MISC 1 Device by Other route daily. 100 each 2  . EPINEPHrine 0.3 mg/0.3 mL IJ SOAJ injection INJECT 0.3 MLS (0.3 MG TOTAL) INTO THE MUSCLE ONCE FOR 1 DOSE.  1  . fexofenadine (ALLEGRA) 180 MG tablet Take 1 tablet (180 mg total) by mouth daily. 90 tablet 3  . glucose blood (ONE TOUCH ULTRA TEST) test strip TEST 1 EACH BY OTHER ROUTE DAILY. DIAG E11.9 100 each 3  . hydrochlorothiazide (HYDRODIURIL) 25 MG tablet Take 1 tablet (25 mg total) by mouth daily. 90 tablet 1  . hyoscyamine (LEVSIN, ANASPAZ) 0.125 MG tablet 1-2 tabs q4h prn 30 tablet 2  . levothyroxine (SYNTHROID, LEVOTHROID) 125 MCG tablet TAKE 1 TABLET EVERY DAY 90 tablet 1  . lisinopril (PRINIVIL,ZESTRIL) 5 MG tablet Take 1 tablet (5 mg total) by mouth daily. 30 tablet 0  . metFORMIN (GLUCOPHAGE) 500 MG tablet TAKE 1 TABLET TWICE DAILY 180 tablet 1  . Omega-3 Fatty Acids (FISH OIL) 1000 MG CAPS Take 1 capsule by mouth 2 (two) times daily.     No facility-administered medications prior to visit.  No Known Allergies  ROS As per HPI  PE: Blood pressure 113/71, pulse 72, temperature 97.8 F (36.6 C), temperature source Oral, resp. rate 16, height 4\' 11"  (1.499 m), weight 144 lb 12.8 oz (65.7 kg), SpO2 96 %. Gen: Alert, well appearing.  Patient is oriented to person, place, time, and situation. AFFECT: pleasant, lucid thought and speech. No further exam today.  LABS:    Chemistry      Component Value Date/Time   NA 141 05/29/2018 1028   NA 139 02/27/2018 1314   K 3.8 05/29/2018 1028   CL 98 05/29/2018 1028   CO2 29 05/29/2018 1028   BUN 18 05/29/2018 1028   BUN 19 02/27/2018  1314   CREATININE 0.77 05/29/2018 1028      Component Value Date/Time   CALCIUM 9.5 05/29/2018 1028   ALKPHOS 75 02/27/2018 1314   AST 17 02/27/2018 1314   ALT 24 02/27/2018 1314   BILITOT <0.2 02/27/2018 1314     Lab Results  Component Value Date   HGBA1C 7.1 (H) 05/29/2018    IMPRESSION AND PLAN:  HTN; not ideal control, but after discussion today we decided not to change anything at this time. BMET today (her first pot/cr check since getting back on lisinopril.  An After Visit Summary was printed and given to the patient.  FOLLOW UP: Return in about 4 months (around 10/15/2018) for routine chronic illness f/u.  Signed:  Crissie Sickles, MD           06/15/2018

## 2018-06-18 ENCOUNTER — Other Ambulatory Visit: Payer: Self-pay | Admitting: Family Medicine

## 2018-07-09 ENCOUNTER — Other Ambulatory Visit: Payer: Self-pay | Admitting: Family Medicine

## 2018-07-10 ENCOUNTER — Encounter: Payer: Self-pay | Admitting: Allergy and Immunology

## 2018-07-10 ENCOUNTER — Other Ambulatory Visit: Payer: Self-pay

## 2018-07-10 ENCOUNTER — Ambulatory Visit (INDEPENDENT_AMBULATORY_CARE_PROVIDER_SITE_OTHER): Payer: Medicare Other | Admitting: Allergy and Immunology

## 2018-07-10 DIAGNOSIS — L508 Other urticaria: Secondary | ICD-10-CM | POA: Diagnosis not present

## 2018-07-10 NOTE — Patient Instructions (Addendum)
Autoimmune urticaria  I have discussed autoimmune urticaria with the patient and answered her questions.  As a problem is currently well controlled, I have recommended a slow, gradual taper of the antihistamines to find the lowest effective dose.   Return if symptoms worsen or fail to improve.

## 2018-07-10 NOTE — Progress Notes (Signed)
Follow-up Telemedicine Note  RE: Mackenzie Wood MRN: [id number] DOB: March 24, 1946 Date of Telemedicine Visit: 07/10/2018  Primary care provider: Tammi Sou, MD Referring provider: Tammi Sou, MD  Telemedicine Follow Up Visit via Telephone: I connected with Mackenzie Wood for a follow up on 07/10/18 by telephone and verified that I am speaking with the correct person using two identifiers.   The limitations, risks, security and privacy concerns of performing an evaluation and management service by telemedicine, the availability of in person appointments, and that there may be a patient responsible charge related to this service were discussed. The patient expressed understanding and agreed to proceed.  Patient is at home.  Provider is at the office.  Visit start time: 9:34 am Visit end time: 9:51 am Insurance consent/check in by: Anderson Malta Medical consent and medical assistant/nurse: Caryl Pina  History of present illness: Mackenzie Wood is a 73 y.o. female with a history of urticaria and angioedema and food allergy presenting via telephone for follow-up visit.  She was previously seen in this clinic for her initial evaluation in December 2019.  Her lab work revealed autoimmune urticaria/angioedema.  She reports that the hives and angioedema resolved after she started taking Allegra 180 mg daily.  She is still taking Allegra every day.  She also notes that a few months ago she was experiencing some mild joint swelling and stiffness in her fingers, however this problem has resolved as well.  She has no complaints today.  Assessment and plan: Autoimmune urticaria  I have discussed autoimmune urticaria with the patient and answered her questions.  As a problem is currently well controlled, I have recommended a slow, gradual taper of the antihistamines to find the lowest effective dose.   Diagnostics: Lab results:  FceR AB: >50 TPO AB: 64 Thyroglobulin AB: 2.0    Physical examination: Physical Exam Not obtained as encounter was done via telephone.   The following portions of the patient's history were reviewed and updated as appropriate: allergies, current medications, past family history, past medical history, past social history, past surgical history and problem list.  Allergies as of 07/10/2018   No Known Allergies     Medication List       Accurate as of July 10, 2018 12:34 PM. Always use your most recent med list.        amLODipine 10 MG tablet Commonly known as:  NORVASC Take 1 tablet (10 mg total) by mouth daily.   Calcium 600+D 600-400 MG-UNIT tablet Generic drug:  Calcium Carbonate-Vitamin D Take 1 tablet by mouth 2 (two) times daily.   [employer] Lancets Ultra-Thin 30G Misc 1 Device by Other route daily.   EPINEPHrine 0.3 mg/0.3 mL Soaj injection Commonly known as:  EPI-PEN INJECT 0.3 MLS (0.3 MG TOTAL) INTO THE MUSCLE ONCE FOR 1 DOSE.   fexofenadine 180 MG tablet Commonly known as:  ALLEGRA Take 1 tablet (180 mg total) by mouth daily.   Fish Oil 1000 MG Caps Take 1 capsule by mouth 2 (two) times daily.   glucose blood test strip Commonly known as:  ONE TOUCH ULTRA TEST TEST 1 EACH BY OTHER ROUTE DAILY. DIAG E11.9   hydrochlorothiazide 25 MG tablet Commonly known as:  HYDRODIURIL Take 1 tablet (25 mg total) by mouth daily.   hyoscyamine 0.125 MG tablet Commonly known as:  LEVSIN, ANASPAZ 1-2 tabs q4h prn   levothyroxine 125 MCG tablet Commonly known as:  SYNTHROID, LEVOTHROID TAKE 1 TABLET EVERY DAY   lisinopril 5 MG  tablet Commonly known as:  PRINIVIL,ZESTRIL TAKE 1 TABLET BY MOUTH EVERY DAY   metFORMIN 500 MG tablet Commonly known as:  GLUCOPHAGE TAKE 1 TABLET TWICE DAILY       No Known Allergies  Previous notes and tests were reviewed.  I discussed the assessment and treatment plan with the patient. The patient was provided an opportunity to ask questions and all were answered. The patient agreed  with the plan and demonstrated an understanding of the instructions.   The patient was advised to call back or seek an in-person evaluation if the symptoms worsen or if the condition fails to improve as anticipated.  I provided 17 minutes of non-face-to-face time during this encounter.  I appreciate the opportunity to take part in Theodore care. Please do not hesitate to contact me with questions.  Sincerely,   R. Edgar Frisk, MD

## 2018-07-10 NOTE — Assessment & Plan Note (Signed)
   I have discussed autoimmune urticaria with the patient and answered her questions.  As a problem is currently well controlled, I have recommended a slow, gradual taper of the antihistamines to find the lowest effective dose.

## 2018-08-09 ENCOUNTER — Other Ambulatory Visit: Payer: Self-pay

## 2018-08-09 ENCOUNTER — Telehealth: Payer: Self-pay | Admitting: Family Medicine

## 2018-08-09 MED ORDER — LISINOPRIL 5 MG PO TABS: 5.0000 mg | ORAL_TABLET | Freq: Every day | ORAL | 1 refills | Status: DC

## 2018-08-09 NOTE — Telephone Encounter (Signed)
RF request for Lisinopril LOV:06/15/18 f/u rci Next ov: 10/15/18 f/u rci Last written: 07/09/18 (30,0)  Pt would like to do 90 d supply using mail order instead.Please advise if okay, thanks. Medication pending.

## 2018-08-09 NOTE — Telephone Encounter (Signed)
Orders only encounter

## 2018-08-10 NOTE — Telephone Encounter (Signed)
My Chart message sent

## 2018-08-10 NOTE — Telephone Encounter (Signed)
Pt was notified of med being sent

## 2018-08-10 NOTE — Telephone Encounter (Signed)
Pt was notified regarding refill.

## 2018-09-01 DIAGNOSIS — H52223 Regular astigmatism, bilateral: Secondary | ICD-10-CM | POA: Diagnosis not present

## 2018-09-01 DIAGNOSIS — H5213 Myopia, bilateral: Secondary | ICD-10-CM | POA: Diagnosis not present

## 2018-09-01 DIAGNOSIS — E119 Type 2 diabetes mellitus without complications: Secondary | ICD-10-CM | POA: Diagnosis not present

## 2018-09-01 DIAGNOSIS — H2513 Age-related nuclear cataract, bilateral: Secondary | ICD-10-CM | POA: Diagnosis not present

## 2018-09-01 DIAGNOSIS — H35033 Hypertensive retinopathy, bilateral: Secondary | ICD-10-CM | POA: Diagnosis not present

## 2018-09-01 DIAGNOSIS — H524 Presbyopia: Secondary | ICD-10-CM | POA: Diagnosis not present

## 2018-09-21 DIAGNOSIS — L814 Other melanin hyperpigmentation: Secondary | ICD-10-CM | POA: Diagnosis not present

## 2018-09-21 DIAGNOSIS — D1801 Hemangioma of skin and subcutaneous tissue: Secondary | ICD-10-CM | POA: Diagnosis not present

## 2018-09-21 DIAGNOSIS — D225 Melanocytic nevi of trunk: Secondary | ICD-10-CM | POA: Diagnosis not present

## 2018-09-21 DIAGNOSIS — D229 Melanocytic nevi, unspecified: Secondary | ICD-10-CM | POA: Diagnosis not present

## 2018-09-21 DIAGNOSIS — L821 Other seborrheic keratosis: Secondary | ICD-10-CM | POA: Diagnosis not present

## 2018-09-21 DIAGNOSIS — D485 Neoplasm of uncertain behavior of skin: Secondary | ICD-10-CM | POA: Diagnosis not present

## 2018-10-15 ENCOUNTER — Other Ambulatory Visit: Payer: Self-pay

## 2018-10-15 ENCOUNTER — Ambulatory Visit (INDEPENDENT_AMBULATORY_CARE_PROVIDER_SITE_OTHER): Payer: Medicare Other | Admitting: Family Medicine

## 2018-10-15 ENCOUNTER — Encounter: Payer: Self-pay | Admitting: Family Medicine

## 2018-10-15 VITALS — BP 118/74 | HR 76 | Temp 98.4°F | Resp 16 | Ht 59.0 in | Wt 142.4 lb

## 2018-10-15 DIAGNOSIS — E119 Type 2 diabetes mellitus without complications: Secondary | ICD-10-CM | POA: Diagnosis not present

## 2018-10-15 DIAGNOSIS — Z1239 Encounter for other screening for malignant neoplasm of breast: Secondary | ICD-10-CM | POA: Diagnosis not present

## 2018-10-15 DIAGNOSIS — E039 Hypothyroidism, unspecified: Secondary | ICD-10-CM | POA: Diagnosis not present

## 2018-10-15 DIAGNOSIS — I10 Essential (primary) hypertension: Secondary | ICD-10-CM | POA: Diagnosis not present

## 2018-10-15 LAB — BASIC METABOLIC PANEL
BUN: 19 mg/dL (ref 6–23)
CO2: 29 mEq/L (ref 19–32)
Calcium: 9.5 mg/dL (ref 8.4–10.5)
Chloride: 99 mEq/L (ref 96–112)
Creatinine, Ser: 0.72 mg/dL (ref 0.40–1.20)
GFR: 78.91 mL/min (ref >60.00)
Glucose, Bld: 108 mg/dL — ABNORMAL HIGH (ref 70–99)
Potassium: 4 mEq/L (ref 3.5–5.1)
Sodium: 139 mEq/L (ref 135–145)

## 2018-10-15 LAB — HEMOGLOBIN A1C: Hgb A1c MFr Bld: 7.2 % — ABNORMAL HIGH (ref 4.6–6.5)

## 2018-10-15 NOTE — Progress Notes (Signed)
OFFICE VISIT  10/15/2018   CC:  Chief Complaint  Patient presents with  . Follow-up    RCI, pt is not fasting   HPI:    Patient is a 73 y.o. [name] who presents for 4 mo f/u HTN,DM 2, Hypothyroidism.  Feeling good. Avg bp 135-140 syst, diast 70s consistently.  Glucoses avg fasting low 140s. Taking metformin.  Diet pretty good diabetic type but has days when she does "cheat" and eat some dessert/snack food.   Eats low Na diet. Not walking much lately.  Stress is up due to covid and some family issues (daughter and grandchild living with them the last few months b/c their well went dry!).  Hypothyroidism: takes T4 on empty stomach every morning, separate from any other meds.  ROS: no CP, no SOB, no wheezing, no cough, no dizziness, no HAs, no rashes, no melena/hematochezia.  No polyuria or polydipsia.    Past Medical History:  Diagnosis Date  . Angioedema 01/2018   Autoimmune urticaria/angioedema-->needs chronic nonsedating antihistamine and an H2 blocker indefinitely.  1 episode ->ASA and ARB d/c'd as precaution.  Then had another episode a couple weeks later.  Another episode just prior to seen allergist--allergist did blood workup/allergy testing->dx of autoimmune urticaria.  . Beta thalassemia, heterozygous    vs alpha thalassemia minor (pt's family originally from [place]).  Marland Kitchen CAP (community acquired pneumonia) 02/2010  . Colon cancer screening    Pt declines colonoscopy.  Cologuard negative 01/2018-->repeat 3 yrs.  . Congenital sensorineural hearing loss    deaf in right ear; mild presbycusis in left ear (audiologist 2014)  . Depression   . HTN (hypertension)   . Hyperkalemia 07/17/2017   Decreased ARB dose, added low dose hctz, low K diet---repeat K normal.  . Hyperlipidemia, mixed    Refuses statins  . Hypothyroidism   . Osteopenia    DEXAs 2003-2013--pt has declined bisphosphonates  . Somnolence, daytime, controlled 04/22/2012  . Type II or unspecified  type diabetes mellitus without mention of complication, uncontrolled   . Vitamin D deficiency     Past Surgical History:  Procedure Laterality Date  . BREAST BIOPSY     Left breast biopsy x 2 (fibrocystic/benign)  . COLONOSCOPY  2008   Normal.  10 yr recall (initial colonoscopy done in [place]--needs referral to local GI in 2018.  . TONSILLECTOMY AND ADENOIDECTOMY     as a child    Outpatient Medications Prior to Visit  Medication Sig Dispense Refill  . amLODipine (NORVASC) 10 MG tablet Take 1 tablet (10 mg total) by mouth daily. 90 tablet 1  . Calcium Carbonate-Vitamin D (CALCIUM 600+D) 600-400 MG-UNIT per tablet Take 1 tablet by mouth 2 (two) times daily.    . [employer] LANCETS ULTRA-THIN 30G MISC 1 Device by Other route daily. 100 each 2  . EPINEPHrine 0.3 mg/0.3 mL IJ SOAJ injection INJECT 0.3 MLS (0.3 MG TOTAL) INTO THE MUSCLE ONCE FOR 1 DOSE.  1  . fexofenadine (ALLEGRA) 180 MG tablet Take 1 tablet (180 mg total) by mouth daily. (Patient taking differently: Take 180 mg by mouth daily. Pt takes as needed.) 90 tablet 3  . glucose blood (ONE TOUCH ULTRA TEST) test strip TEST 1 EACH BY OTHER ROUTE DAILY. DIAG E11.9 100 each 3  . hydrochlorothiazide (HYDRODIURIL) 25 MG tablet Take 1 tablet (25 mg total) by mouth daily. 90 tablet 1  . hyoscyamine (LEVSIN, ANASPAZ) 0.125 MG tablet 1-2 tabs q4h prn 30 tablet 2  . levothyroxine (SYNTHROID, LEVOTHROID)  125 MCG tablet TAKE 1 TABLET EVERY DAY 90 tablet 1  . lisinopril (ZESTRIL) 5 MG tablet Take 1 tablet (5 mg total) by mouth daily. 90 tablet 1  . metFORMIN (GLUCOPHAGE) 500 MG tablet TAKE 1 TABLET TWICE DAILY 180 tablet 1  . Omega-3 Fatty Acids (FISH OIL) 1000 MG CAPS Take 1 capsule by mouth 2 (two) times daily.     No facility-administered medications prior to visit.     No Known Allergies  ROS As per HPI  PE: Blood pressure 118/74, pulse 76, temperature 98.4 F (36.9 C), temperature source Temporal, resp. rate 16, height 4\' 11"  (1.499  m), weight 142 lb 6.4 oz (64.6 kg), SpO2 96 %. Body mass index is 28.76 kg/m.  VITALS: Blood pressure 118/74, pulse 76, temperature 98.4 F (36.9 C), temperature source Temporal, resp. rate 16, height 4\' 11"  (1.499 m), weight 142 lb 6.4 oz (64.6 kg), SpO2 96 %. Body mass index is 28.76 kg/m.  Gen: alert, well appearing. HEENT: eyes without swelling, eryth, or drainage. Neck: no adenopathy, thyromegaly, or tenderness. Lungs: CTA bilat, nonlabored resps.  CV: RRR, no m/r/g Abd: soft, NT. EXT: no edema   LABS:  Lab Results  Component Value Date   TSH 2.61 02/07/2018   Lab Results  Component Value Date   WBC 6.8 02/27/2018   HGB 12.5 02/27/2018   HCT 39.2 02/27/2018   MCV 63 (L) 02/27/2018   PLT 250.0 07/17/2017   Lab Results  Component Value Date   CREATININE 0.76 06/15/2018   BUN 24 (H) 06/15/2018   NA 138 06/15/2018   K 3.8 06/15/2018   CL 98 06/15/2018   CO2 29 06/15/2018   Lab Results  Component Value Date   ALT 24 02/27/2018   AST 17 02/27/2018   ALKPHOS 75 02/27/2018   BILITOT <0.2 02/27/2018   Lab Results  Component Value Date   CHOL 198 01/06/2016   Lab Results  Component Value Date   HDL 49.90 01/06/2016   Lab Results  Component Value Date   LDLCALC 126 (H) 03/03/2014   Lab Results  Component Value Date   TRIG 202.0 (H) 01/06/2016   Lab Results  Component Value Date   CHOLHDL 4 01/06/2016   Lab Results  Component Value Date   HGBA1C 7.1 (H) 05/29/2018    IMPRESSION AND PLAN:  1) DM 2: fasting's not ideal but ok.  Not checking any 2H PP's. Continue metformin 500mg  bid, check A1c today. We discussed things today and agreed that if A1c up over 7.5% then we'll increase her metformin. If <7.5% she'll rely solely on dietary improvement and getting back into daily walking for exercise.  2) HTN: mild elevation of systolics.  She hates messing with her med regimen and declines any change today.  She is perfectly satisfied with where he bp is  right now. BMET today.  3) Breast ca screening: she wants to put this year's mammogram off a little while-->too much going on in her life right now/stress. Will address again at future visit.  4) Hypothyroidism: taking T4 correctly. TSH monitoring due at next f/u in 4 mo.  An After Visit Summary was printed and given to the patient.  FOLLOW UP: Return in about 4 months (around 02/15/2019) for routine chronic illness f/u.  Signed:  Crissie Sickles, MD           10/15/2018

## 2018-10-17 ENCOUNTER — Other Ambulatory Visit: Payer: Self-pay | Admitting: Family Medicine

## 2018-10-17 ENCOUNTER — Other Ambulatory Visit: Payer: Self-pay | Admitting: *Deleted

## 2018-10-17 DIAGNOSIS — Z1231 Encounter for screening mammogram for malignant neoplasm of breast: Secondary | ICD-10-CM

## 2018-11-01 ENCOUNTER — Other Ambulatory Visit: Payer: Self-pay | Admitting: Family Medicine

## 2018-11-05 ENCOUNTER — Other Ambulatory Visit: Payer: Self-pay

## 2018-11-05 MED ORDER — CVS LANCETS ULTRA-THIN 30G MISC: 1.0000 | Freq: Every day | 2 refills | Status: DC

## 2018-11-30 ENCOUNTER — Other Ambulatory Visit: Payer: Self-pay | Admitting: Family Medicine

## 2018-11-30 DIAGNOSIS — E119 Type 2 diabetes mellitus without complications: Secondary | ICD-10-CM

## 2018-12-11 ENCOUNTER — Other Ambulatory Visit: Payer: Self-pay

## 2018-12-11 ENCOUNTER — Ambulatory Visit
Admission: RE | Admit: 2018-12-11 | Discharge: 2018-12-11 | Disposition: A | Discharge status: Home / Self Care | Payer: Medicare Other | Source: Ambulatory Visit | Attending: Family Medicine | Admitting: Family Medicine

## 2018-12-11 DIAGNOSIS — Z1231 Encounter for screening mammogram for malignant neoplasm of breast: Secondary | ICD-10-CM

## 2019-01-16 ENCOUNTER — Other Ambulatory Visit: Payer: Medicare Other

## 2019-01-16 ENCOUNTER — Other Ambulatory Visit: Payer: Self-pay

## 2019-01-16 ENCOUNTER — Ambulatory Visit (INDEPENDENT_AMBULATORY_CARE_PROVIDER_SITE_OTHER): Payer: Medicare Other | Admitting: Family Medicine

## 2019-01-16 ENCOUNTER — Encounter: Payer: Self-pay | Admitting: Family Medicine

## 2019-01-16 ENCOUNTER — Ambulatory Visit (INDEPENDENT_AMBULATORY_CARE_PROVIDER_SITE_OTHER): Payer: Medicare Other

## 2019-01-16 VITALS — BP 118/74 | HR 74 | Temp 98.1°F | Resp 16 | Ht 59.0 in | Wt 135.2 lb

## 2019-01-16 DIAGNOSIS — M1712 Unilateral primary osteoarthritis, left knee: Secondary | ICD-10-CM | POA: Diagnosis not present

## 2019-01-16 DIAGNOSIS — Z23 Encounter for immunization: Secondary | ICD-10-CM | POA: Diagnosis not present

## 2019-01-16 DIAGNOSIS — M25562 Pain in left knee: Secondary | ICD-10-CM

## 2019-01-16 DIAGNOSIS — S8392XA Sprain of unspecified site of left knee, initial encounter: Secondary | ICD-10-CM | POA: Diagnosis not present

## 2019-01-16 DIAGNOSIS — S8992XA Unspecified injury of left lower leg, initial encounter: Secondary | ICD-10-CM | POA: Diagnosis not present

## 2019-01-16 MED ORDER — METHYLPREDNISOLONE ACETATE 80 MG/ML IJ SUSP
80.0000 mg | Freq: Once | INTRAMUSCULAR | Status: AC
  Administered 2019-01-16: 80 mg via INTRAMUSCULAR

## 2019-01-16 NOTE — Progress Notes (Signed)
OFFICE VISIT  01/16/2019   CC:  Chief Complaint  Patient presents with  . Knee Pain    left, x3 months   HPI:    Patient is a 73 y.o. [name] who presents for left knee pain. She thinks she may have "twisted it" about 3 and 1/2 mo ago when playing badminton with her grandson b/c it has has hurt since then (although she doesn't recall any specific incident during the activity), getting more persistent, like a tooth ache lately.  Not taking any med for it.  Impairs sleep some. Describes mostly diffuse peri-patellar pain, no persistently focal location.  Occ "shooting pains" up leg and down leg. "Locks up" on her sometimes after standing still for a long time. No giving way or falling. Sometimes she has trouble extending it fully.  At times during the day she seems to be totally asymptomatic "randomly" but it always comes back. No signif swelling noted, no redness or warmth.  No icing.  About 1 mo ago started moderate exercise, seemed to feel worse after 2 wks so she stopped this.  Long walks/hikes in last 2 yrs have given her soreness in L knee for 1 day or so but NOT CONSISTENTLY.  Past Medical History:  Diagnosis Date  . Angioedema 01/2018   Autoimmune urticaria/angioedema-->needs chronic nonsedating antihistamine and an H2 blocker indefinitely.  1 episode ->ASA and ARB d/c'd as precaution.  Then had another episode a couple weeks later.  Another episode just prior to seen allergist--allergist did blood workup/allergy testing->dx of autoimmune urticaria.  . Beta thalassemia, heterozygous    vs alpha thalassemia minor (pt's family originally from [place]).  Marland Kitchen CAP (community acquired pneumonia) 02/2010  . Colon cancer screening    Pt declines colonoscopy.  Cologuard negative 01/2018-->repeat 3 yrs.  . Congenital sensorineural hearing loss    deaf in right ear; mild presbycusis in left ear (audiologist 2014)  . Depression   . HTN (hypertension)   . Hyperkalemia 07/17/2017    Decreased ARB dose, added low dose hctz, low K diet---repeat K normal.  . Hyperlipidemia, mixed    Refuses statins  . Hypothyroidism   . Osteopenia    DEXAs 2003-2013--pt has declined bisphosphonates  . Somnolence, daytime, controlled 04/22/2012  . Type II or unspecified type diabetes mellitus without mention of complication, uncontrolled   . Vitamin D deficiency     Past Surgical History:  Procedure Laterality Date  . BREAST BIOPSY     Left breast biopsy x 2 (fibrocystic/benign)  . COLONOSCOPY  2008   Normal.  10 yr recall (initial colonoscopy done in [place]--needs referral to local GI in 2018.  . TONSILLECTOMY AND ADENOIDECTOMY     as a child    Outpatient Medications Prior to Visit  Medication Sig Dispense Refill  . amLODipine (NORVASC) 10 MG tablet TAKE 1 TABLET (10 MG TOTAL) BY MOUTH DAILY. 90 tablet 1  . Calcium Carbonate-Vitamin D (CALCIUM 600+D) 600-400 MG-UNIT per tablet Take 1 tablet by mouth 2 (two) times daily.    . [facility] Lancets Ultra-Thin 30G MISC 1 Device by Other route daily. 100 each 2  . glucose blood (ONE TOUCH ULTRA TEST) test strip TEST 1 EACH BY OTHER ROUTE DAILY. DIAG E11.9 100 each 3  . hydrochlorothiazide (HYDRODIURIL) 25 MG tablet TAKE 1 TABLET (25 MG TOTAL) BY MOUTH DAILY. 90 tablet 1  . hyoscyamine (LEVSIN, ANASPAZ) 0.125 MG tablet 1-2 tabs q4h prn 30 tablet 2  . levothyroxine (SYNTHROID) 125 MCG tablet TAKE 1  TABLET EVERY DAY 90 tablet 1  . lisinopril (ZESTRIL) 5 MG tablet Take 1 tablet (5 mg total) by mouth daily. 90 tablet 1  . metFORMIN (GLUCOPHAGE) 500 MG tablet TAKE 1 TABLET TWICE DAILY 180 tablet 1  . Omega-3 Fatty Acids (FISH OIL) 1000 MG CAPS Take 1 capsule by mouth 2 (two) times daily.    Marland Kitchen EPINEPHrine 0.3 mg/0.3 mL IJ SOAJ injection INJECT 0.3 MLS (0.3 MG TOTAL) INTO THE MUSCLE ONCE FOR 1 DOSE.  1  . fexofenadine (ALLEGRA) 180 MG tablet Take 1 tablet (180 mg total) by mouth daily. (Patient not taking: Reported on 01/16/2019) 90 tablet 3    No facility-administered medications prior to visit.     No Known Allergies  ROS As per HPI  PE: Blood pressure 118/74, pulse 74, temperature 98.1 F (36.7 C), temperature source Temporal, resp. rate 16, height 4\' 11"  (1.499 m), weight 135 lb 3.2 oz (61.3 kg), SpO2 98 %. Gen: Alert, well appearing.  Patient is oriented to person, place, time, and situation. AFFECT: pleasant, lucid thought and speech. L knee: mild soft tissue fullness in peri-patellar area and mild warmth compared to R. No erythema or tenderness anywhere.   Mild pain with full passive extension and flexion.  McMurray's negative. NO instability of medial or lateral collat ligaments or with lachman's or post drawer. She is limping some.  LABS:    Chemistry      Component Value Date/Time   NA 139 10/15/2018 1034   NA 139 02/27/2018 1314   K 4.0 10/15/2018 1034   CL 99 10/15/2018 1034   CO2 29 10/15/2018 1034   BUN 19 10/15/2018 1034   BUN 19 02/27/2018 1314   CREATININE 0.72 10/15/2018 1034   CREATININE 0.77 05/29/2018 1028      Component Value Date/Time   CALCIUM 9.5 10/15/2018 1034   ALKPHOS 75 02/27/2018 1314   AST 17 02/27/2018 1314   ALT 24 02/27/2018 1314   BILITOT <0.2 02/27/2018 1314       IMPRESSION AND PLAN:  Acute left knee pain after increased activity: ? sprain/twist injury (although she doesn't have recollection of the actual specific event). Exam today normal except some pain with full extension and full flexion and mild swelling that looks to be more soft tissue than effusion.  Low suspicion of internal derangement. Discussed options today and decided on trial of steroid injection and if not signif improving in 10d or so then we'll proceed with either PT or sports med referral.  Left knee x-ray today. Relative rest x 1-2 days. Knee sleeve qd, ice after significant activity.  An After Visit Summary was printed and given to the patient.  FOLLOW UP: Return if symptoms worsen or fail  to improve.  Signed:  Crissie Sickles, MD           01/16/2019

## 2019-01-16 NOTE — Addendum Note (Signed)
Addended by: Deveron Furlong D on: 01/16/2019 11:43 AM   Modules accepted: Orders

## 2019-02-05 ENCOUNTER — Other Ambulatory Visit: Payer: Self-pay | Admitting: Family Medicine

## 2019-02-11 ENCOUNTER — Other Ambulatory Visit: Payer: Self-pay | Admitting: Family Medicine

## 2019-02-13 ENCOUNTER — Other Ambulatory Visit: Payer: Self-pay

## 2019-02-14 ENCOUNTER — Encounter: Payer: Self-pay | Admitting: Family Medicine

## 2019-02-14 ENCOUNTER — Ambulatory Visit (INDEPENDENT_AMBULATORY_CARE_PROVIDER_SITE_OTHER): Payer: Medicare Other | Admitting: Family Medicine

## 2019-02-14 VITALS — BP 112/70 | HR 74 | Temp 98.0°F | Resp 16 | Ht 59.0 in | Wt 131.8 lb

## 2019-02-14 DIAGNOSIS — E039 Hypothyroidism, unspecified: Secondary | ICD-10-CM

## 2019-02-14 DIAGNOSIS — E119 Type 2 diabetes mellitus without complications: Secondary | ICD-10-CM | POA: Diagnosis not present

## 2019-02-14 DIAGNOSIS — I10 Essential (primary) hypertension: Secondary | ICD-10-CM

## 2019-02-14 LAB — COMPREHENSIVE METABOLIC PANEL
ALT: 14 U/L (ref 0–35)
AST: 14 U/L (ref 0–37)
Albumin: 4.7 g/dL (ref 3.5–5.2)
Alkaline Phosphatase: 58 U/L (ref 39–117)
BUN: 22 mg/dL (ref 6–23)
CO2: 29 mEq/L (ref 19–32)
Calcium: 9.9 mg/dL (ref 8.4–10.5)
Chloride: 97 mEq/L (ref 96–112)
Creatinine, Ser: 0.86 mg/dL (ref 0.40–1.20)
GFR: 64.22 mL/min (ref >60.00)
Glucose, Bld: 140 mg/dL — ABNORMAL HIGH (ref 70–99)
Potassium: 3.6 mEq/L (ref 3.5–5.1)
Sodium: 139 mEq/L (ref 135–145)
Total Bilirubin: 0.5 mg/dL (ref 0.2–1.2)
Total Protein: 7.1 g/dL (ref 6.0–8.3)

## 2019-02-14 LAB — HEMOGLOBIN A1C: Hgb A1c MFr Bld: 7.1 % — ABNORMAL HIGH (ref 4.6–6.5)

## 2019-02-14 LAB — TSH: TSH: 0.23 u[IU]/mL — ABNORMAL LOW (ref 0.35–4.50)

## 2019-02-14 NOTE — Progress Notes (Signed)
OFFICE VISIT  02/14/2019   CC:  Chief Complaint  Patient presents with  . Follow-up    RCI, pt is fasting   HPI:    Patient is a 73 y.o. [name] who presents for 4 mo f/u HTN, DM, and hypothyroidism.  DM: avg fasting 130s-140s.  Feet: asymptomatic. HTN: occ home bp measurement always <120s/80s.  Hypoth: complete compliance with 1 tab qd 5d/week, 1/2 tab 2 d/week.  Takes correctly.  ROS: no CP, no SOB, no wheezing, no cough, no dizziness, no HAs, no rashes, no melena/hematochezia.  No polyuria or polydipsia.  No myalgias or arthralgias.   Past Medical History:  Diagnosis Date  . Angioedema 01/2018   Autoimmune urticaria/angioedema-->needs chronic nonsedating antihistamine and an H2 blocker indefinitely.  1 episode ->ASA and ARB d/c'd as precaution.  Then had another episode a couple weeks later.  Another episode just prior to seen allergist--allergist did blood workup/allergy testing->dx of autoimmune urticaria.  . Beta thalassemia, heterozygous    vs alpha thalassemia minor (pt's family originally from [place]).  Marland Kitchen CAP (community acquired pneumonia) 02/2010  . Colon cancer screening    Pt declines colonoscopy.  Cologuard negative 01/2018-->repeat 3 yrs.  . Congenital sensorineural hearing loss    deaf in right ear; mild presbycusis in left ear (audiologist 2014)  . Depression   . HTN (hypertension)   . Hyperkalemia 07/17/2017   Decreased ARB dose, added low dose hctz, low K diet---repeat K normal.  . Hyperlipidemia, mixed    Refuses statins  . Hypothyroidism   . Osteopenia    DEXAs 2003-2013--pt has declined bisphosphonates  . Somnolence, daytime, controlled 04/22/2012  . Type II or unspecified type diabetes mellitus without mention of complication, uncontrolled   . Vitamin D deficiency     Past Surgical History:  Procedure Laterality Date  . BREAST BIOPSY     Left breast biopsy x 2 (fibrocystic/benign)  . COLONOSCOPY  2008   Normal.  10 yr recall (initial  colonoscopy done in [place]--needs referral to local GI in 2018.  . TONSILLECTOMY AND ADENOIDECTOMY     as a child    Outpatient Medications Prior to Visit  Medication Sig Dispense Refill  . amLODipine (NORVASC) 10 MG tablet TAKE 1 TABLET (10 MG TOTAL) BY MOUTH DAILY. 90 tablet 1  . Calcium Carbonate-Vitamin D (CALCIUM 600+D) 600-400 MG-UNIT per tablet Take 1 tablet by mouth 2 (two) times daily.    . [facility]S Lancets Ultra-Thin 30G MISC 1 Device by Other route daily. 100 each 2  . EPINEPHrine 0.3 mg/0.3 mL IJ SOAJ injection INJECT 0.3 MLS (0.3 MG TOTAL) INTO THE MUSCLE ONCE FOR 1 DOSE.  1  . hydrochlorothiazide (HYDRODIURIL) 25 MG tablet TAKE 1 TABLET (25 MG TOTAL) BY MOUTH DAILY. 90 tablet 1  . hyoscyamine (LEVSIN, ANASPAZ) 0.125 MG tablet 1-2 tabs q4h prn 30 tablet 2  . levothyroxine (SYNTHROID) 125 MCG tablet TAKE 1 TABLET EVERY DAY 90 tablet 1  . lisinopril (ZESTRIL) 5 MG tablet TAKE 1 TABLET EVERY DAY 90 tablet 1  . metFORMIN (GLUCOPHAGE) 500 MG tablet TAKE 1 TABLET TWICE DAILY 180 tablet 1  . Omega-3 Fatty Acids (FISH OIL) 1000 MG CAPS Take 1 capsule by mouth 2 (two) times daily.    Glory Rosebush ULTRA test strip TEST 1 EACH BY OTHER ROUTE DAILY. DIAG E11.9 100 strip 3  . fexofenadine (ALLEGRA) 180 MG tablet Take 1 tablet (180 mg total) by mouth daily. (Patient not taking: Reported on 01/16/2019) 90 tablet 3  No facility-administered medications prior to visit.     No Known Allergies  ROS As per HPI  PE: Blood pressure 112/70, pulse 74, temperature 98 F (36.7 C), temperature source Temporal, resp. rate 16, height 4\' 11"  (1.499 m), weight 131 lb 12.8 oz (59.8 kg), SpO2 98 %. Body mass index is 26.62 kg/m.  Gen: Alert, well appearing.  Patient is oriented to person, place, time, and situation. AFFECT: pleasant, lucid thought and speech. Foot exam - bilateral normal; no swelling, tenderness or skin or vascular lesions. Color and temperature is normal. Sensation is intact.  Peripheral pulses are palpable. Toenails are normal.   LABS:  Lab Results  Component Value Date   TSH 2.61 02/07/2018   Lab Results  Component Value Date   WBC 6.8 02/27/2018   HGB 12.5 02/27/2018   HCT 39.2 02/27/2018   MCV 63 (L) 02/27/2018   PLT 250.0 07/17/2017   Lab Results  Component Value Date   CREATININE 0.72 10/15/2018   BUN 19 10/15/2018   NA 139 10/15/2018   K 4.0 10/15/2018   CL 99 10/15/2018   CO2 29 10/15/2018   Lab Results  Component Value Date   ALT 24 02/27/2018   AST 17 02/27/2018   ALKPHOS 75 02/27/2018   BILITOT <0.2 02/27/2018   Lab Results  Component Value Date   CHOL 198 01/06/2016   Lab Results  Component Value Date   HDL 49.90 01/06/2016   Lab Results  Component Value Date   LDLCALC 126 (H) 03/03/2014   Lab Results  Component Value Date   TRIG 202.0 (H) 01/06/2016   Lab Results  Component Value Date   CHOLHDL 4 01/06/2016   Lab Results  Component Value Date   HGBA1C 7.2 (H) 10/15/2018    IMPRESSION AND PLAN:  1) DM 2: glucoses pretty good fasting but A1c may have risen. Check a1c today. Feet exam normal today. She has plans for annual eye exam. Lytes/cr today.  2) HTN: The current medical regimen is effective;  continue present plan and medications. Lytes/cr today.  3) Hypothyroidism: taking med correctly/compliant. TSH monitoring today.  An After Visit Summary was printed and given to the patient.  FOLLOW UP: Return in about 4 months (around 06/14/2019) for routine chronic illness f/u.  Signed:  Crissie Sickles, MD           02/14/2019

## 2019-02-20 ENCOUNTER — Other Ambulatory Visit: Payer: Self-pay

## 2019-03-18 ENCOUNTER — Encounter: Payer: Self-pay | Admitting: Family Medicine

## 2019-05-08 ENCOUNTER — Telehealth: Payer: Self-pay

## 2019-05-08 ENCOUNTER — Other Ambulatory Visit: Payer: Self-pay

## 2019-05-08 MED ORDER — HYDROCHLOROTHIAZIDE 25 MG PO TABS: 25.0000 mg | ORAL_TABLET | Freq: Every day | ORAL | 0 refills | Status: DC

## 2019-05-08 MED ORDER — AMLODIPINE BESYLATE 10 MG PO TABS: 10.0000 mg | ORAL_TABLET | Freq: Every day | ORAL | 0 refills | Status: DC

## 2019-05-08 MED ORDER — LISINOPRIL 5 MG PO TABS: 5.0000 mg | ORAL_TABLET | Freq: Every day | ORAL | 0 refills | Status: DC

## 2019-05-08 NOTE — Telephone Encounter (Signed)
Verified w/ patient if she would have enough until appt next month, RF's sent. She is aware that if dose changes are needed during visit, new rx will be sent.

## 2019-05-08 NOTE — Telephone Encounter (Signed)
Patient has changed insurance policy to [place] this year.  She has a new pharmacy - please update preferred pharmacy to  [facility] (if they need patient's ID # it is [place] [id number]) Patient will call them to update and will bring insurance card into office on her next visit.   She needs 3 new prescriptions with 90 d/s each  amLODipine (NORVASC) 10 MG tablet [id number]   hydrochlorothiazide (HYDRODIURIL) 25 MG tablet [id number]   lisinopril (ZESTRIL) 5 MG tablet [id number]

## 2019-05-12 ENCOUNTER — Ambulatory Visit: Payer: Medicare Other

## 2019-05-12 ENCOUNTER — Ambulatory Visit: Payer: Medicare Other | Attending: Internal Medicine

## 2019-05-12 DIAGNOSIS — Z23 Encounter for immunization: Secondary | ICD-10-CM | POA: Insufficient documentation

## 2019-05-12 NOTE — Progress Notes (Signed)
   Covid-19 Vaccination Clinic  Name:  Mackenzie Wood    MRN: [id number] DOB: 1945/09/27  05/12/2019  Mackenzie Wood was observed post Covid-19 immunization for 15 minutes without incidence. She was provided with Vaccine Information Sheet and instruction to access the V-Safe system.   Mackenzie Wood was instructed to call 911 with any severe reactions post vaccine: Marland Kitchen Difficulty breathing  . Swelling of your face and throat  . A fast heartbeat  . A bad rash all over your body  . Dizziness and weakness    Immunizations Administered    Name Date Dose VIS Date Route   Pfizer COVID-19 Vaccine 05/12/2019  1:33 PM 0.3 mL 03/08/2019 Intramuscular   Manufacturer: [place]   Lot: [id number]   [place]: [id number]

## 2019-05-14 ENCOUNTER — Other Ambulatory Visit: Payer: Self-pay

## 2019-05-14 DIAGNOSIS — E119 Type 2 diabetes mellitus without complications: Secondary | ICD-10-CM

## 2019-05-14 MED ORDER — ONETOUCH ULTRA VI STRP: ORAL_STRIP | 3 refills | Status: DC

## 2019-06-04 ENCOUNTER — Ambulatory Visit: Payer: Medicare Other | Attending: Internal Medicine

## 2019-06-04 DIAGNOSIS — Z23 Encounter for immunization: Secondary | ICD-10-CM

## 2019-06-04 NOTE — Progress Notes (Signed)
   Covid-19 Vaccination Clinic  Name:  Mackenzie Wood    MRN: [id number] DOB: 27-Sep-1945  06/04/2019  [name] was observed post Covid-19 immunization for 30 minutes based on pre-vaccination screening without incident. She was provided with Vaccine Information Sheet and instruction to access the V-Safe system.   [name] was instructed to call 911 with any severe reactions post vaccine: Marland Kitchen Difficulty breathing  . Swelling of face and throat  . A fast heartbeat  . A bad rash all over body  . Dizziness and weakness   Immunizations Administered    Name Date Dose VIS Date Route   Pfizer COVID-19 Vaccine 06/04/2019 11:28 AM 0.3 mL 03/08/2019 Intramuscular   Manufacturer: [place]   Lot: [id number]   [place]: [id number]

## 2019-06-05 ENCOUNTER — Ambulatory Visit: Payer: Medicare Other

## 2019-06-06 DIAGNOSIS — H6122 Impacted cerumen, left ear: Secondary | ICD-10-CM | POA: Diagnosis not present

## 2019-06-17 ENCOUNTER — Encounter: Payer: Self-pay | Admitting: Family Medicine

## 2019-06-17 ENCOUNTER — Ambulatory Visit (INDEPENDENT_AMBULATORY_CARE_PROVIDER_SITE_OTHER): Payer: Medicare Other | Admitting: Family Medicine

## 2019-06-17 ENCOUNTER — Other Ambulatory Visit: Payer: Self-pay

## 2019-06-17 ENCOUNTER — Ambulatory Visit: Payer: Medicare Other | Admitting: Family Medicine

## 2019-06-17 VITALS — BP 100/67 | HR 73 | Temp 98.0°F | Resp 16 | Ht 59.0 in | Wt 131.4 lb

## 2019-06-17 DIAGNOSIS — E119 Type 2 diabetes mellitus without complications: Secondary | ICD-10-CM

## 2019-06-17 DIAGNOSIS — E785 Hyperlipidemia, unspecified: Secondary | ICD-10-CM

## 2019-06-17 DIAGNOSIS — E039 Hypothyroidism, unspecified: Secondary | ICD-10-CM | POA: Diagnosis not present

## 2019-06-17 DIAGNOSIS — I10 Essential (primary) hypertension: Secondary | ICD-10-CM | POA: Diagnosis not present

## 2019-06-17 LAB — TSH: TSH: 0.1 u[IU]/mL — ABNORMAL LOW (ref 0.35–4.50)

## 2019-06-17 LAB — HEMOGLOBIN A1C: Hgb A1c MFr Bld: 6.7 % — ABNORMAL HIGH (ref 4.6–6.5)

## 2019-06-17 LAB — BASIC METABOLIC PANEL
BUN: 21 mg/dL (ref 6–23)
CO2: 29 mEq/L (ref 19–32)
Calcium: 9.5 mg/dL (ref 8.4–10.5)
Chloride: 101 mEq/L (ref 96–112)
Creatinine, Ser: 0.73 mg/dL (ref 0.40–1.20)
GFR: 77.52 mL/min (ref >60.00)
Glucose, Bld: 144 mg/dL — ABNORMAL HIGH (ref 70–99)
Potassium: 4 mEq/L (ref 3.5–5.1)
Sodium: 140 mEq/L (ref 135–145)

## 2019-06-17 NOTE — Progress Notes (Signed)
OFFICE VISIT  06/17/2019   CC:  Chief Complaint  Patient presents with  . Follow-up    RCI, pt is not fasting    HPI:    Patient is a 74 y.o. [name] who presents for 4 mo f/u HTN, HLD, and DM 2. A/P as of last visit: "1) DM 2: glucoses pretty good fasting but A1c may have risen. Check a1c today. Feet exam normal today. She has plans for annual eye exam. Lytes/cr today.  2) HTN: The current medical regimen is effective;  continue present plan and medications. Lytes/cr today.  3) Hypothyroidism: taking med correctly/compliant. TSH monitoring today.  Result note as of last f/u visit: All labs stable: A1c 7.1%. Thyroid just a TINY bit elevated so I want her to take 1/2 of her 125 microgram tab three days per week and a whole tab all other days. I'll recheck thyroid at next f/u office visit.  Interim hx: Feeling very well. HTN: 125/70 avg, HR 75 avg.  Feeling well. Housework.  Still babying her knee.  Not sedentary.  She made the thyroid dose adjustment as instructed, takes it correctly.  Gluc avg 130s. Diet is fair.  ROS: no fevers, no CP, no SOB, no wheezing, no cough, no dizziness, no HAs, no rashes, no melena/hematochezia.  No polyuria or polydipsia.  No myalgias.  Chronic L knee arthralgia. No focal weakness, paresthesias, or tremors.  No acute vision or hearing abnormalities. No n/v/d or abd pain.  No palpitations.      Past Medical History:  Diagnosis Date  . Angioedema 01/2018   Autoimmune urticaria/angioedema-->needs chronic nonsedating antihistamine and an H2 blocker indefinitely.  1 episode ->ASA and ARB d/c'd as precaution.  Then had another episode a couple weeks later.  Another episode just prior to seen allergist--allergist did blood workup/allergy testing->dx of autoimmune urticaria.  . Beta thalassemia, heterozygous    vs alpha thalassemia minor (pt's family originally from [place]).  Marland Kitchen CAP (community acquired pneumonia) 02/2010  . Colon  cancer screening    Pt declines colonoscopy.  Cologuard negative 01/2018-->repeat 3 yrs.  . Congenital sensorineural hearing loss    deaf in right ear; mild presbycusis in left ear (audiologist 2014)  . Depression   . HTN (hypertension)   . Hyperkalemia 07/17/2017   Decreased ARB dose, added low dose hctz, low K diet---repeat K normal.  . Hyperlipidemia, mixed    Refuses statins  . Hypothyroidism   . Osteopenia    DEXAs 2003-2013--pt has declined bisphosphonates  . Somnolence, daytime, controlled 04/22/2012  . Type II or unspecified type diabetes mellitus without mention of complication, uncontrolled   . Vitamin D deficiency     Past Surgical History:  Procedure Laterality Date  . BREAST BIOPSY     Left breast biopsy x 2 (fibrocystic/benign)  . COLONOSCOPY  2008   Normal.  10 yr recall (initial colonoscopy done in [place]--needs referral to local GI in 2018.  . TONSILLECTOMY AND ADENOIDECTOMY     as a child    Outpatient Medications Prior to Visit  Medication Sig Dispense Refill  . amLODipine (NORVASC) 10 MG tablet Take 1 tablet (10 mg total) by mouth daily. 90 tablet 0  . Calcium Carbonate-Vitamin D (CALCIUM 600+D) 600-400 MG-UNIT per tablet Take 1 tablet by mouth 2 (two) times daily.    . [facility]S Lancets Ultra-Thin 30G MISC 1 Device by Other route daily. 100 each 2  . glucose blood (ONETOUCH ULTRA) test strip Use to check glucose 1-2 times daily  as needed. Dx E11.9 100 strip 3  . hydrochlorothiazide (HYDRODIURIL) 25 MG tablet Take 1 tablet (25 mg total) by mouth daily. 90 tablet 0  . levothyroxine (SYNTHROID) 125 MCG tablet TAKE 1 TABLET EVERY DAY (Patient taking differently: Pt takes 1/2 tab 3 days and 4 days takes 1 whole tab.) 90 tablet 1  . lisinopril (ZESTRIL) 5 MG tablet Take 1 tablet (5 mg total) by mouth daily. 90 tablet 0  . metFORMIN (GLUCOPHAGE) 500 MG tablet TAKE 1 TABLET TWICE DAILY 180 tablet 1  . Omega-3 Fatty Acids (FISH OIL) 1000 MG CAPS Take 1 capsule by mouth  2 (two) times daily.    Marland Kitchen EPINEPHrine 0.3 mg/0.3 mL IJ SOAJ injection INJECT 0.3 MLS (0.3 MG TOTAL) INTO THE MUSCLE ONCE FOR 1 DOSE.  1  . fexofenadine (ALLEGRA) 180 MG tablet Take 1 tablet (180 mg total) by mouth daily. (Patient not taking: Reported on 01/16/2019) 90 tablet 3  . hyoscyamine (LEVSIN, ANASPAZ) 0.125 MG tablet 1-2 tabs q4h prn (Patient not taking: Reported on 06/17/2019) 30 tablet 2   No facility-administered medications prior to visit.    No Known Allergies  ROS As per HPI  PE: Blood pressure 100/67, pulse 73, temperature 98 F (36.7 C), temperature source Temporal, resp. rate 16, height 4\' 11"  (1.499 m), weight 131 lb 6.4 oz (59.6 kg), SpO2 99 %. Body mass index is 26.54 kg/m.  Gen: Alert, well appearing.  Patient is oriented to person, place, time, and situation. AFFECT: pleasant, lucid thought and speech. No further exam today.  LABS:  Lab Results  Component Value Date   TSH 0.23 (L) 02/14/2019   Lab Results  Component Value Date   WBC 6.8 02/27/2018   HGB 12.5 02/27/2018   HCT 39.2 02/27/2018   MCV 63 (L) 02/27/2018   PLT 250.0 07/17/2017   Lab Results  Component Value Date   CREATININE 0.86 02/14/2019   BUN 22 02/14/2019   NA 139 02/14/2019   K 3.6 02/14/2019   CL 97 02/14/2019   CO2 29 02/14/2019   Lab Results  Component Value Date   ALT 14 02/14/2019   AST 14 02/14/2019   ALKPHOS 58 02/14/2019   BILITOT 0.5 02/14/2019   Lab Results  Component Value Date   CHOL 198 01/06/2016   Lab Results  Component Value Date   HDL 49.90 01/06/2016   Lab Results  Component Value Date   LDLCALC 126 (H) 03/03/2014   Lab Results  Component Value Date   TRIG 202.0 (H) 01/06/2016   Lab Results  Component Value Date   CHOLHDL 4 01/06/2016   Lab Results  Component Value Date   HGBA1C 7.1 (H) 02/14/2019    IMPRESSION AND PLAN:  1) DM 2, good control. Eye exam planned to be done sometime in next couple months. HbA1c and BMET today.  2)  HTN: doing well on amlodipine, hctz, and lisin at curent doses. BMET today.  3) Hypothyroidism: TSH monitoring after slight dose adjustment 4 mo ago.  4) HLD: has been mild, she has declined statin multiple times and continues to do so. No lipid check necessary at this time. Continue to work on [employer] efforts.  An After Visit Summary was printed and given to the patient.  FOLLOW UP: Return in about 4 months (around 10/17/2019) for routine chronic illness f/u.  Signed:  Crissie Sickles, MD           06/17/2019

## 2019-06-18 ENCOUNTER — Telehealth: Payer: Self-pay

## 2019-06-18 ENCOUNTER — Other Ambulatory Visit: Payer: Self-pay

## 2019-06-18 DIAGNOSIS — E039 Hypothyroidism, unspecified: Secondary | ICD-10-CM

## 2019-06-18 MED ORDER — LEVOTHYROXINE SODIUM 100 MCG PO TABS: 100.0000 ug | ORAL_TABLET | Freq: Every day | ORAL | 1 refills | Status: DC

## 2019-06-18 NOTE — Telephone Encounter (Signed)
Patient checking on new thyroid Rx. [facility] has not received Rx,

## 2019-06-18 NOTE — Telephone Encounter (Signed)
Rx just sent in for patient, she was notified.

## 2019-07-02 ENCOUNTER — Telehealth: Payer: Self-pay

## 2019-07-02 ENCOUNTER — Other Ambulatory Visit: Payer: Self-pay

## 2019-07-02 DIAGNOSIS — E119 Type 2 diabetes mellitus without complications: Secondary | ICD-10-CM

## 2019-07-02 MED ORDER — METFORMIN HCL 500 MG PO TABS: 500.0000 mg | ORAL_TABLET | Freq: Two times a day (BID) | ORAL | 1 refills | Status: DC

## 2019-07-02 NOTE — Telephone Encounter (Signed)
Rx sent to [facility]. Left detailed message on pt's home number. Okay per [facility]

## 2019-07-02 NOTE — Telephone Encounter (Signed)
Patient has about 8 days left of metFORMIN (GLUCOPHAGE) 500 MG tablet. Please send in Rx to [facility].

## 2019-08-06 ENCOUNTER — Other Ambulatory Visit: Payer: Self-pay | Admitting: Family Medicine

## 2019-08-06 ENCOUNTER — Other Ambulatory Visit: Payer: Self-pay

## 2019-08-06 ENCOUNTER — Ambulatory Visit (INDEPENDENT_AMBULATORY_CARE_PROVIDER_SITE_OTHER): Payer: Medicare Other | Admitting: Family Medicine

## 2019-08-06 DIAGNOSIS — E039 Hypothyroidism, unspecified: Secondary | ICD-10-CM | POA: Diagnosis not present

## 2019-08-06 LAB — TSH: TSH: 0.2 u[IU]/mL — ABNORMAL LOW (ref 0.35–4.50)

## 2019-08-06 MED ORDER — HYDROCHLOROTHIAZIDE 25 MG PO TABS: 25.0000 mg | ORAL_TABLET | Freq: Every day | ORAL | 1 refills | Status: DC

## 2019-08-06 MED ORDER — LISINOPRIL 5 MG PO TABS: 5.0000 mg | ORAL_TABLET | Freq: Every day | ORAL | 1 refills | Status: DC

## 2019-08-06 MED ORDER — AMLODIPINE BESYLATE 10 MG PO TABS: 10.0000 mg | ORAL_TABLET | Freq: Every day | ORAL | 1 refills | Status: DC

## 2019-08-07 ENCOUNTER — Other Ambulatory Visit: Payer: Self-pay | Admitting: Family Medicine

## 2019-08-07 DIAGNOSIS — E039 Hypothyroidism, unspecified: Secondary | ICD-10-CM

## 2019-08-07 MED ORDER — LEVOTHYROXINE SODIUM 88 MCG PO TABS
88.0000 ug | ORAL_TABLET | Freq: Every day | ORAL | 1 refills | Status: DC
Start: 2019-08-07 — End: 2019-09-27

## 2019-09-01 ENCOUNTER — Other Ambulatory Visit: Payer: Self-pay | Admitting: Family Medicine

## 2019-09-09 DIAGNOSIS — H35033 Hypertensive retinopathy, bilateral: Secondary | ICD-10-CM | POA: Diagnosis not present

## 2019-09-09 DIAGNOSIS — H524 Presbyopia: Secondary | ICD-10-CM | POA: Diagnosis not present

## 2019-09-09 DIAGNOSIS — L814 Other melanin hyperpigmentation: Secondary | ICD-10-CM | POA: Diagnosis not present

## 2019-09-09 DIAGNOSIS — L720 Epidermal cyst: Secondary | ICD-10-CM | POA: Diagnosis not present

## 2019-09-09 DIAGNOSIS — H2513 Age-related nuclear cataract, bilateral: Secondary | ICD-10-CM | POA: Diagnosis not present

## 2019-09-09 DIAGNOSIS — D229 Melanocytic nevi, unspecified: Secondary | ICD-10-CM | POA: Diagnosis not present

## 2019-09-09 DIAGNOSIS — E119 Type 2 diabetes mellitus without complications: Secondary | ICD-10-CM | POA: Diagnosis not present

## 2019-09-09 LAB — HM DIABETES EYE EXAM

## 2019-09-11 ENCOUNTER — Ambulatory Visit: Payer: Medicare Other | Admitting: Family Medicine

## 2019-09-23 ENCOUNTER — Other Ambulatory Visit: Payer: Self-pay | Admitting: Family Medicine

## 2019-09-24 ENCOUNTER — Encounter: Payer: Self-pay | Admitting: Family Medicine

## 2019-09-26 ENCOUNTER — Other Ambulatory Visit: Payer: Self-pay

## 2019-09-26 ENCOUNTER — Ambulatory Visit (INDEPENDENT_AMBULATORY_CARE_PROVIDER_SITE_OTHER): Payer: Medicare Other

## 2019-09-26 DIAGNOSIS — E039 Hypothyroidism, unspecified: Secondary | ICD-10-CM | POA: Diagnosis not present

## 2019-09-27 ENCOUNTER — Other Ambulatory Visit: Payer: Self-pay

## 2019-09-27 LAB — TSH: TSH: 1.29 u[IU]/mL (ref 0.35–4.50)

## 2019-09-27 MED ORDER — LEVOTHYROXINE SODIUM 88 MCG PO TABS: 88.0000 ug | ORAL_TABLET | Freq: Every day | ORAL | 3 refills | Status: DC

## 2019-10-04 ENCOUNTER — Other Ambulatory Visit: Payer: Self-pay

## 2019-10-04 ENCOUNTER — Ambulatory Visit (INDEPENDENT_AMBULATORY_CARE_PROVIDER_SITE_OTHER): Payer: Medicare Other | Admitting: Family Medicine

## 2019-10-04 ENCOUNTER — Encounter: Payer: Self-pay | Admitting: Family Medicine

## 2019-10-04 VITALS — BP 108/68 | HR 77 | Temp 97.9°F | Resp 18 | Ht 59.0 in | Wt 131.2 lb

## 2019-10-04 DIAGNOSIS — K219 Gastro-esophageal reflux disease without esophagitis: Secondary | ICD-10-CM

## 2019-10-04 DIAGNOSIS — E119 Type 2 diabetes mellitus without complications: Secondary | ICD-10-CM

## 2019-10-04 DIAGNOSIS — R05 Cough: Secondary | ICD-10-CM | POA: Diagnosis not present

## 2019-10-04 DIAGNOSIS — R059 Cough, unspecified: Secondary | ICD-10-CM

## 2019-10-04 DIAGNOSIS — R058 Other specified cough: Secondary | ICD-10-CM

## 2019-10-04 MED ORDER — ONETOUCH ULTRA VI STRP: ORAL_STRIP | 3 refills | Status: DC

## 2019-10-04 MED ORDER — PANTOPRAZOLE SODIUM 40 MG PO TBEC
40.0000 mg | DELAYED_RELEASE_TABLET | Freq: Every day | ORAL | 3 refills | Status: DC
Start: 2019-10-04 — End: 2019-11-01

## 2019-10-04 NOTE — Patient Instructions (Signed)
Elevate the head of your bed (ideally with 6 inch  bed blocks),  Smoking cessation, avoidance of late meals, excessive alcohol, and avoid fatty foods, chocolate, peppermint, colas, red wine, and acidic juices such as orange juice.  NO MINT OR MENTHOL PRODUCTS SO NO COUGH DROPS   USE SUGARLESS CANDY INSTEAD (Jolley ranchers or Stover's or Life Savers) or even ice chips will also do - the key is to swallow to prevent all throat clearing. NO OIL BASED VITAMINS - use powdered substitutes.  TAKE OTC GENERIC PEPCID EVERY NIGHT FOR 2 WEEKS (ALONG WITH YOUR PANTOPRAZOLE EVERY MORNING)

## 2019-10-04 NOTE — Progress Notes (Signed)
OFFICE VISIT  10/04/2019   CC:  Chief Complaint  Patient presents with  . Cough    Pt has had chronic dry cough x1.5 months. Feels she is wheezing and has "rattling" in chest    HPI:    Patient is a 74 y.o. [name] who presents for cough. She is also due for HTN and DM f/u.  HPI: Onset around May 15th (1.5 mo ago). Dry cough but forceful, comes and goes.  Started hearing some "rattling and wheezing" at rest and the cough would clear this up but would not produce any sputum.  After coughing spells she has a strange metallic taste in her mouth. Started taking allegra-- No effect. No SOB or DOE.  No LE swelling.   Says it feel like originates in upper central chest area.  No pattern or trigger known, denies feeling any GERD sx's.  No ST or hoarseness.  Around the time of her coughing she does feel like she has to clear her throat/swallow frequently for a while. No use of otc cough suppressant.  No cough drops. No recent new rx of otc meds. No change in diet prior to onset of sx's. She is a NEVER SMOKER. Cough then got more sporadic, days between problems.   Lisinopril started about 05/2018.  ROS: no fevers, no CP, no weight loss or fatigue, no dizziness, no HAs, no rashes, no melena/hematochezia.  No polyuria or polydipsia.  No myalgias or arthralgias.  No focal weakness, paresthesias, or tremors.  No acute vision or hearing abnormalities. No n/v/d or abd pain.  No palpitations.    Past Medical History:  Diagnosis Date  . Angioedema 01/2018   Autoimmune urticaria/angioedema-->needs chronic nonsedating antihistamine and an H2 blocker indefinitely.  1 episode ->ASA and ARB d/c'd as precaution.  Then had another episode a couple weeks later.  Another episode just prior to seen allergist--allergist did blood workup/allergy testing->dx of autoimmune urticaria.  . Beta thalassemia, heterozygous    vs alpha thalassemia minor (pt's family originally from [place]).  Marland Kitchen CAP (community  acquired pneumonia) 02/2010  . Colon cancer screening    Pt declines colonoscopy.  Cologuard negative 01/2018-->repeat 3 yrs.  . Congenital sensorineural hearing loss    deaf in right ear; mild presbycusis in left ear (audiologist 2014)  . Depression   . HTN (hypertension)   . Hyperkalemia 07/17/2017   Decreased ARB dose, added low dose hctz, low K diet---repeat K normal.  . Hyperlipidemia, mixed    Refuses statins  . Hypothyroidism   . Osteopenia    DEXAs 2003-2013--pt has declined bisphosphonates  . Somnolence, daytime, controlled 04/22/2012  . Type II or unspecified type diabetes mellitus without mention of complication, uncontrolled   . Vitamin D deficiency     Past Surgical History:  Procedure Laterality Date  . BREAST BIOPSY     Left breast biopsy x 2 (fibrocystic/benign)  . COLONOSCOPY  2008   Normal.  10 yr recall (initial colonoscopy done in [place]--needs referral to local GI in 2018.  . TONSILLECTOMY AND ADENOIDECTOMY     as a child    Outpatient Medications Prior to Visit  Medication Sig Dispense Refill  . amLODipine (NORVASC) 10 MG tablet Take 1 tablet (10 mg total) by mouth daily. 90 tablet 1  . Calcium Carbonate-Vitamin D (CALCIUM 600+D) 600-400 MG-UNIT per tablet Take 1 tablet by mouth 2 (two) times daily.    . [facility] Lancets Ultra-Thin 30G MISC USE TO TEST BLOOD SUGAR ONCE DAILY 100 each  2  . hydrochlorothiazide (HYDRODIURIL) 25 MG tablet Take 1 tablet (25 mg total) by mouth daily. 90 tablet 1  . hyoscyamine (LEVSIN, ANASPAZ) 0.125 MG tablet 1-2 tabs q4h prn 30 tablet 2  . levothyroxine (SYNTHROID) 88 MCG tablet Take 1 tablet (88 mcg total) by mouth daily. 90 tablet 3  . lisinopril (ZESTRIL) 5 MG tablet Take 1 tablet (5 mg total) by mouth daily. 90 tablet 1  . metFORMIN (GLUCOPHAGE) 500 MG tablet Take 1 tablet (500 mg total) by mouth 2 (two) times daily. 180 tablet 1  . Omega-3 Fatty Acids (FISH OIL) 1000 MG CAPS Take 1 capsule by mouth 2 (two) times daily.     Marland Kitchen glucose blood (ONETOUCH ULTRA) test strip Use to check glucose 1-2 times daily as needed. Dx E11.9 100 strip 3  . EPINEPHrine 0.3 mg/0.3 mL IJ SOAJ injection INJECT 0.3 MLS (0.3 MG TOTAL) INTO THE MUSCLE ONCE FOR 1 DOSE. (Patient not taking: Reported on 10/04/2019)  1  . fexofenadine (ALLEGRA) 180 MG tablet Take 1 tablet (180 mg total) by mouth daily. (Patient not taking: Reported on 01/16/2019) 90 tablet 3   No facility-administered medications prior to visit.    No Known Allergies  ROS As per HPI  PE: Vitals with BMI 10/04/2019 06/17/2019 02/14/2019  Height 4\' 11"  4\' 11"  4\' 11"   Weight 131 lbs 4 oz 131 lbs 6 oz 131 lbs 13 oz  BMI 26.5 47.82 95.62  Systolic 130 865 784  Diastolic 68 67 70  Pulse 77 73 74  O2 sat today on RA is 96%  Gen: Alert, well appearing.  Patient is oriented to person, place, time, and situation. AFFECT: pleasant, lucid thought and speech. ENT: Ears: EACs clear, normal epithelium.  TMs with good light reflex and landmarks bilaterally.  Eyes: no injection, icteris, swelling, or exudate.  EOMI, PERRLA. Nose: no drainage or turbinate edema/swelling.  No injection or focal lesion.  Mouth: lips without lesion/swelling.  Oral mucosa pink and moist.  Dentition intact and without obvious caries or gingival swelling.  Oropharynx without erythema, exudate, or swelling.  Neck - No masses or thyromegaly or limitation in range of motion CV: RRR, no m/r/g.   LUNGS: CTA bilat, nonlabored resps, good aeration in all lung fields. EXT: no clubbing or cyanosis.  no edema.    LABS:  Lab Results  Component Value Date   TSH 1.29 09/26/2019   Lab Results  Component Value Date   WBC 6.8 02/27/2018   HGB 12.5 02/27/2018   HCT 39.2 02/27/2018   MCV 63 (L) 02/27/2018   PLT 250.0 07/17/2017   Lab Results  Component Value Date   CREATININE 0.73 06/17/2019   BUN 21 06/17/2019   NA 140 06/17/2019   K 4.0 06/17/2019   CL 101 06/17/2019   CO2 29 06/17/2019   Lab Results   Component Value Date   ALT 14 02/14/2019   AST 14 02/14/2019   ALKPHOS 58 02/14/2019   BILITOT 0.5 02/14/2019   Lab Results  Component Value Date   CHOL 198 01/06/2016   Lab Results  Component Value Date   HDL 49.90 01/06/2016   Lab Results  Component Value Date   LDLCALC 126 (H) 03/03/2014   Lab Results  Component Value Date   TRIG 202.0 (H) 01/06/2016   Lab Results  Component Value Date   CHOLHDL 4 01/06/2016   Lab Results  Component Value Date   HGBA1C 6.7 (H) 06/17/2019    IMPRESSION AND PLAN:  Cough: Suspect upper airway cough syndrome/silent GERD. Pantoprazole 40mg  qd started today. Also otc pepcid qhs x 2 wks. Upper airway cough syndrome info discussed: "Elevate the head of your bed (ideally with 6 inch  bed blocks),  Smoking cessation, avoidance of late meals, excessive alcohol, and avoid fatty foods, chocolate, peppermint, colas, red wine, and acidic juices such as orange juice.  NO MINT OR MENTHOL PRODUCTS SO NO COUGH DROPS   USE SUGARLESS CANDY INSTEAD (Jolley ranchers or Stover's or Life Savers) or even ice chips will also do - the key is to swallow to prevent all throat clearing. NO OIL BASED VITAMINS - use powdered substitutes."  We did not address her chronic illness f/u today but we'll do this at f/u in 1 mo.  An After Visit Summary was printed and given to the patient.  FOLLOW UP: Return in about 4 weeks (around 11/01/2019) for COUGH AND routine chronic illness f/u.  Signed:  Crissie Sickles, MD           10/04/2019

## 2019-10-08 ENCOUNTER — Telehealth: Payer: Self-pay

## 2019-10-08 MED ORDER — CVS LANCETS ULTRA-THIN 30G MISC: 2 refills | Status: DC

## 2019-10-08 NOTE — Telephone Encounter (Signed)
Refill sent to pharmacy.   

## 2019-10-08 NOTE — Telephone Encounter (Signed)
Patient has been to pharmacy 5 times since 09/23/19 to get this prescription.   DX code needed before prescription can be filled.  Please assist and advise. Patient can be reached at [phone].  [facility] Lancets Ultra-Thin 30G MISC [[id number]  [facility]

## 2019-10-08 NOTE — Telephone Encounter (Signed)
Patient notified

## 2019-10-17 ENCOUNTER — Ambulatory Visit: Payer: Medicare Other | Admitting: Family Medicine

## 2019-11-01 ENCOUNTER — Ambulatory Visit (INDEPENDENT_AMBULATORY_CARE_PROVIDER_SITE_OTHER): Payer: Medicare Other | Admitting: Family Medicine

## 2019-11-01 ENCOUNTER — Encounter: Payer: Self-pay | Admitting: Family Medicine

## 2019-11-01 ENCOUNTER — Other Ambulatory Visit: Payer: Self-pay

## 2019-11-01 VITALS — BP 118/73 | HR 62 | Temp 98.3°F | Ht 59.0 in | Wt 131.6 lb

## 2019-11-01 DIAGNOSIS — E119 Type 2 diabetes mellitus without complications: Secondary | ICD-10-CM | POA: Diagnosis not present

## 2019-11-01 DIAGNOSIS — R05 Cough: Secondary | ICD-10-CM

## 2019-11-01 DIAGNOSIS — I10 Essential (primary) hypertension: Secondary | ICD-10-CM

## 2019-11-01 DIAGNOSIS — N3941 Urge incontinence: Secondary | ICD-10-CM

## 2019-11-01 DIAGNOSIS — R059 Cough, unspecified: Secondary | ICD-10-CM

## 2019-11-01 LAB — BASIC METABOLIC PANEL
BUN: 19 mg/dL (ref 6–23)
CO2: 31 mEq/L (ref 19–32)
Calcium: 9.9 mg/dL (ref 8.4–10.5)
Chloride: 100 mEq/L (ref 96–112)
Creatinine, Ser: 0.87 mg/dL (ref 0.40–1.20)
GFR: 63.25 mL/min (ref >60.00)
Glucose, Bld: 101 mg/dL — ABNORMAL HIGH (ref 70–99)
Potassium: 4.1 mEq/L (ref 3.5–5.1)
Sodium: 139 mEq/L (ref 135–145)

## 2019-11-01 LAB — HEMOGLOBIN A1C: Hgb A1c MFr Bld: 6.9 % — ABNORMAL HIGH (ref 4.6–6.5)

## 2019-11-01 NOTE — Progress Notes (Signed)
OFFICE VISIT  11/01/2019   CC:  Chief Complaint  Patient presents with  . Follow-up    not fasting  . Cough    little better   HPI:    Patient is a 74 y.o. [name] who presents for 4 wk f/u cough, plus routine f/u DM 2 and HTN.  Cough: better but not 100%.  Doesn't feel like "bronchial" anymore.  Good days and bad days. Now recalls going to ENT in [place] in remote past for "choking and stuff in throat a lot". Dx'd with postnasal drip cough and reflux.   She is tired of "all this medication business" and wants to get off pantoprazole and stay off antihist and not try nasal spray at this time. No SOB, wheezing, or signif mucous production or hemoptysis.  HTN:  No home bp measurements.  DM 2: home glucoses show fastings typically 120s-130s.  Dealing with some urinary urgency but no frequency, occ very little incontinence--grad worsening over the last 2 yrs or so.  She is adapting to it ok. No nocturia, urinary frequency, or dysuria or stress incont.  ROS: no fevers, no CP, no SOB, no wheezing, no dizziness, no HAs, no rashes, no melena/hematochezia.  No polyuria or polydipsia.  No myalgias or arthralgias.  No focal weakness, paresthesias, or tremors.  No acute vision or hearing abnormalities. No n/v/d or abd pain.  No palpitations.   Energy level is good, sleep is good.   Past Medical History:  Diagnosis Date  . Angioedema 01/2018   Autoimmune urticaria/angioedema-->needs chronic nonsedating antihistamine and an H2 blocker indefinitely.  1 episode ->ASA and ARB d/c'd as precaution.  Then had another episode a couple weeks later.  Another episode just prior to seen allergist--allergist did blood workup/allergy testing->dx of autoimmune urticaria.  . Beta thalassemia, heterozygous    vs alpha thalassemia minor (pt's family originally from [place]).  Marland Kitchen CAP (community acquired pneumonia) 02/2010  . Colon cancer screening    Pt declines colonoscopy.  Cologuard negative  01/2018-->repeat 3 yrs.  . Congenital sensorineural hearing loss    deaf in right ear; mild presbycusis in left ear (audiologist 2014)  . Depression   . HTN (hypertension)   . Hyperkalemia 07/17/2017   Decreased ARB dose, added low dose hctz, low K diet---repeat K normal.  . Hyperlipidemia, mixed    Refuses statins  . Hypothyroidism   . Osteopenia    DEXAs 2003-2013--pt has declined bisphosphonates  . Somnolence, daytime, controlled 04/22/2012  . Type II or unspecified type diabetes mellitus without mention of complication, uncontrolled   . Vitamin D deficiency     Past Surgical History:  Procedure Laterality Date  . BREAST BIOPSY     Left breast biopsy x 2 (fibrocystic/benign)  . COLONOSCOPY  2008   Normal.  10 yr recall (initial colonoscopy done in [place]--needs referral to local GI in 2018.  . TONSILLECTOMY AND ADENOIDECTOMY     as a child    Outpatient Medications Prior to Visit  Medication Sig Dispense Refill  . amLODipine (NORVASC) 10 MG tablet Take 1 tablet (10 mg total) by mouth daily. 90 tablet 1  . Calcium Carbonate-Vitamin D (CALCIUM 600+D) 600-400 MG-UNIT per tablet Take 1 tablet by mouth 2 (two) times daily.    . [facility] Lancets Ultra-Thin 30G MISC USE TO TEST BLOOD SUGAR ONCE DAILY DX11.9 (Patient taking differently: USE TO TEST BLOOD SUGAR ONCE DAILY DX: E11.9) 100 each 2  . EPINEPHrine 0.3 mg/0.3 mL IJ SOAJ injection INJECT 0.3  MLS (0.3 MG TOTAL) INTO THE MUSCLE ONCE FOR 1 DOSE.  1  . glucose blood (ONETOUCH ULTRA) test strip Use to check glucose 1-2 times daily as needed. Dx E11.9 100 strip 3  . hydrochlorothiazide (HYDRODIURIL) 25 MG tablet Take 1 tablet (25 mg total) by mouth daily. 90 tablet 1  . hyoscyamine (LEVSIN, ANASPAZ) 0.125 MG tablet 1-2 tabs q4h prn 30 tablet 2  . levothyroxine (SYNTHROID) 88 MCG tablet Take 1 tablet (88 mcg total) by mouth daily. 90 tablet 3  . lisinopril (ZESTRIL) 5 MG tablet Take 1 tablet (5 mg total) by mouth daily. 90 tablet 1   . metFORMIN (GLUCOPHAGE) 500 MG tablet Take 1 tablet (500 mg total) by mouth 2 (two) times daily. 180 tablet 1  . Omega-3 Fatty Acids (FISH OIL) 1000 MG CAPS Take 1 capsule by mouth 2 (two) times daily.    . pantoprazole (PROTONIX) 40 MG tablet Take 1 tablet (40 mg total) by mouth daily. 30 tablet 3  . fexofenadine (ALLEGRA) 180 MG tablet Take 1 tablet (180 mg total) by mouth daily. (Patient not taking: Reported on 01/16/2019) 90 tablet 3   No facility-administered medications prior to visit.    No Known Allergies  ROS As per HPI  PE: Vitals with BMI 11/01/2019 10/04/2019 06/17/2019  Height 4\' 11"  4\' 11"  4\' 11"   Weight 131 lbs 10 oz 131 lbs 4 oz 131 lbs 6 oz  BMI 26.57 09.9 83.38  Systolic 250 539 767  Diastolic 73 68 67  Pulse 62 77 73  O2 sat 97% on RA today  Gen: Alert, well appearing.  Patient is oriented to person, place, time, and situation. AFFECT: pleasant, lucid thought and speech. HAL:PFXT: no injection, icteris, swelling, or exudate.  EOMI, PERRLA. Mouth: lips without lesion/swelling.  Oral mucosa pink and moist. Oropharynx without erythema, exudate, or swelling.  CV: RRR, no m/r/g.   LUNGS: CTA bilat, nonlabored resps, good aeration in all lung fields. EXT: no clubbing or cyanosis.  no edema.    LABS:  Lab Results  Component Value Date   TSH 1.29 09/26/2019   Lab Results  Component Value Date   WBC 6.8 02/27/2018   HGB 12.5 02/27/2018   HCT 39.2 02/27/2018   MCV 63 (L) 02/27/2018   PLT 250.0 07/17/2017   Lab Results  Component Value Date   CREATININE 0.73 06/17/2019   BUN 21 06/17/2019   NA 140 06/17/2019   K 4.0 06/17/2019   CL 101 06/17/2019   CO2 29 06/17/2019   Lab Results  Component Value Date   ALT 14 02/14/2019   AST 14 02/14/2019   ALKPHOS 58 02/14/2019   BILITOT 0.5 02/14/2019   Lab Results  Component Value Date   CHOL 198 01/06/2016   Lab Results  Component Value Date   HDL 49.90 01/06/2016   Lab Results  Component Value Date    LDLCALC 126 (H) 03/03/2014   Lab Results  Component Value Date   TRIG 202.0 (H) 01/06/2016   Lab Results  Component Value Date   CHOLHDL 4 01/06/2016   Lab Results  Component Value Date   HGBA1C 6.7 (H) 06/17/2019    IMPRESSION AND PLAN:  1) DM 2, well controlled. A1c and BMET today.  2) HTN: The current medical regimen is effective;  continue present plan and medications. Lytes/cr today.  3) Cough: upper airway cough syndrome. She elects to take no med treatment at this time.  Will take pantoprazole off her med list.  4) Urge incontinence: no tx at this time b/c potential adverse effects of med for this likely > than prob. She is doing well dealing with this. Signs/symptoms to call or return for were reviewed and pt expressed understanding.  An After Visit Summary was printed and given to the patient.  FOLLOW UP: Return in about 6 months (around 05/03/2020) for routine chronic illness f/u.  Signed:  Crissie Sickles, MD           11/01/2019

## 2020-01-09 ENCOUNTER — Other Ambulatory Visit: Payer: Self-pay | Admitting: Family Medicine

## 2020-01-09 DIAGNOSIS — Z1231 Encounter for screening mammogram for malignant neoplasm of breast: Secondary | ICD-10-CM

## 2020-01-21 ENCOUNTER — Other Ambulatory Visit: Payer: Self-pay | Admitting: Family Medicine

## 2020-01-21 DIAGNOSIS — E119 Type 2 diabetes mellitus without complications: Secondary | ICD-10-CM

## 2020-01-28 ENCOUNTER — Telehealth: Payer: Self-pay | Admitting: Family Medicine

## 2020-01-28 ENCOUNTER — Other Ambulatory Visit: Payer: Self-pay

## 2020-01-28 DIAGNOSIS — E119 Type 2 diabetes mellitus without complications: Secondary | ICD-10-CM

## 2020-01-28 MED ORDER — CVS LANCETS ULTRA-THIN 30G MISC: 2 refills | Status: DC

## 2020-01-28 NOTE — Telephone Encounter (Signed)
Spoke with patient and advised I would [facility] to clarify diagnosis. She will give it a day or so before going to get new prescription.

## 2020-01-28 NOTE — Telephone Encounter (Signed)
Contacted pharmacy and verified Dx code needed. Refill submitted for lancets. Pharmacy will call back if not covered or other issues, they can ask for me by name.

## 2020-01-28 NOTE — Telephone Encounter (Signed)
°  Who's calling (name and relationship to patient) : Mackenzie Wood contact number: [phone]  Provider they see: Dr Anitra Lauth  Reason for call: I spoke with patient today and schedule her AWV.  She wanted me to leave a message for the provider to send in the correct Dx Code for her Lancets so the insurance will cover it.  She stated she has been paying out of pocket.  Please advise.     PRESCRIPTION REFILL ONLY  Name of prescription:  Pharmacy:

## 2020-02-04 ENCOUNTER — Ambulatory Visit
Admission: RE | Admit: 2020-02-04 | Discharge: 2020-02-04 | Disposition: A | Discharge status: Home / Self Care | Payer: Medicare Other | Source: Ambulatory Visit | Attending: Family Medicine | Admitting: Family Medicine

## 2020-02-04 ENCOUNTER — Other Ambulatory Visit: Payer: Self-pay

## 2020-02-04 DIAGNOSIS — Z1231 Encounter for screening mammogram for malignant neoplasm of breast: Secondary | ICD-10-CM

## 2020-02-04 NOTE — Progress Notes (Signed)
Subjective:   Mackenzie Wood is a 74 y.o. female who presents for Medicare Annual (Subsequent) preventive examination.   I connected with Mackenzie Wood today by telephone and verified that I am speaking with the correct person using two identifiers. Location patient: home Location provider: work Persons participating in the virtual visit: patient, Marine scientist.    I discussed the limitations, risks, security and privacy concerns of performing an evaluation and management service by telephone and the availability of in person appointments. I also discussed with the patient that there may be a patient responsible charge related to this service. The patient expressed understanding and verbally consented to this telephonic visit.    Interactive audio and video telecommunications were attempted between this provider and patient, however failed, due to patient having technical difficulties OR patient did not have access to video capability.  We continued and completed visit with audio only.  Some vital signs may be absent or patient reported.   Time Spent with patient on telephone encounter: 25 minutes  Review of Systems     Cardiac Risk Factors include: advanced age (>98men, >91 women);diabetes mellitus;hypertension;dyslipidemia     Objective:    Today's Vitals   02/05/20 0945  Weight: 131 lb (59.4 kg)  Height: 4\' 11"  (1.499 m)   Body mass index is 26.46 kg/m.  Advanced Directives 02/05/2020 01/13/2017 06/21/2014  Does Patient Have a Medical Advance Directive? Yes Yes No  Type of Paramedic of [place];Living will [place];Living will -  Copy of [place] in Chart? No - copy requested No - copy requested -  Would patient like information on creating a medical advance directive? - - No - patient declined information    Current Medications (verified) Outpatient Encounter Medications as of 02/05/2020  Medication Sig  .  amLODipine (NORVASC) 10 MG tablet Take 1 tablet (10 mg total) by mouth daily.  . Calcium Carbonate-Vitamin D (CALCIUM 600+D) 600-400 MG-UNIT per tablet Take 1 tablet by mouth 2 (two) times daily.  . [employer] Lancets Ultra-Thin 30G MISC USE TO TEST BLOOD SUGAR ONCE DAILY DX11.9  . EPINEPHrine 0.3 mg/0.3 mL IJ SOAJ injection INJECT 0.3 MLS (0.3 MG TOTAL) INTO THE MUSCLE ONCE FOR 1 DOSE.  Marland Kitchen glucose blood (ONETOUCH ULTRA) test strip Use to check glucose 1-2 times daily as needed. Dx E11.9  . hydrochlorothiazide (HYDRODIURIL) 25 MG tablet TAKE 1 TABLET DAILY  . hyoscyamine (LEVSIN, ANASPAZ) 0.125 MG tablet 1-2 tabs q4h prn  . levothyroxine (SYNTHROID) 88 MCG tablet Take 1 tablet (88 mcg total) by mouth daily.  Marland Kitchen lisinopril (ZESTRIL) 5 MG tablet TAKE 1 TABLET DAILY  . metFORMIN (GLUCOPHAGE) 500 MG tablet TAKE 1 TABLET TWICE A DAY  . Omega-3 Fatty Acids (FISH OIL) 1000 MG CAPS Take 1 capsule by mouth 2 (two) times daily.  . fexofenadine (ALLEGRA) 180 MG tablet Take 1 tablet (180 mg total) by mouth daily. (Patient not taking: Reported on 01/16/2019)   No facility-administered encounter medications on file as of 02/05/2020.    Allergies (verified) Patient has no known allergies.   History: Past Medical History:  Diagnosis Date  . Angioedema 01/2018   Autoimmune urticaria/angioedema-->needs chronic nonsedating antihistamine and an H2 blocker indefinitely.  1 episode ->ASA and ARB d/c'd as precaution.  Then had another episode a couple weeks later.  Another episode just prior to seen allergist--allergist did blood workup/allergy testing->dx of autoimmune urticaria.  . Beta thalassemia, heterozygous    vs alpha thalassemia minor (pt's  family originally from [place]).  Marland Kitchen CAP (community acquired pneumonia) 02/2010  . Colon cancer screening    Pt declines colonoscopy.  Cologuard negative 01/2018-->repeat 3 yrs.  . Congenital sensorineural hearing loss    deaf in right ear; mild presbycusis in left ear  (audiologist 2014)  . Depression   . HTN (hypertension)   . Hyperkalemia 07/17/2017   Decreased ARB dose, added low dose hctz, low K diet---repeat K normal.  . Hyperlipidemia, mixed    Refuses statins  . Hypothyroidism   . Osteopenia    DEXAs 2003-2013--pt has declined bisphosphonates  . Somnolence, daytime, controlled 04/22/2012  . Type II or unspecified type diabetes mellitus without mention of complication, uncontrolled   . Vitamin D deficiency    Past Surgical History:  Procedure Laterality Date  . BREAST BIOPSY     Left breast biopsy x 2 (fibrocystic/benign)  . COLONOSCOPY  2008   Normal.  10 yr recall (initial colonoscopy done in [place]--needs referral to local GI in 2018.  . TONSILLECTOMY AND ADENOIDECTOMY     as a child   Family History  Problem Relation Age of Onset  . Hypertension Mother   . Hypertension Father    Social History   Socioeconomic History  . Marital status: Married    Spouse name: Not on file  . Number of children: Not on file  . Years of education: Not on file  . Highest education level: Not on file  Occupational History  . Not on file  Tobacco Use  . Smoking status: Never Smoker  . Smokeless tobacco: Never Used  Vaping Use  . Vaping Use: Never used  Substance and Sexual Activity  . Alcohol use: No  . Drug use: No  . Sexual activity: Not on file  Other Topics Concern  . Not on file  Social History Narrative   Married, 2 daughters, 3 grandchildren.   Retired Archivist.  Relocated from [place] around 2010.   No T/A/Ds.  Exercise: sedentary life + no formal exercise at all.   Diet: regular            Social Determinants of Health   Financial Resource Strain: Low Risk   . Difficulty of Paying Living Expenses: Not hard at all  Food Insecurity: No Food Insecurity  . Worried About Charity fundraiser in the Last Year: Never true  . Ran Out of Food in the Last Year: Never true  Transportation Needs: No Transportation Needs    . Lack of Transportation (Medical): No  . Lack of Transportation (Non-Medical): No  Physical Activity: Sufficiently Active  . Days of Exercise per Week: 4 days  . Minutes of Exercise per Session: 40 min  Stress: No Stress Concern Present  . Feeling of Stress : Not at all  Social Connections: Socially Integrated  . Frequency of Communication with Friends and Family: More than three times a week  . Frequency of Social Gatherings with Friends and Family: Once a week  . Attends Religious Services: More than 4 times per year  . Active Member of Clubs or Organizations: Yes  . Attends Archivist Meetings: More than 4 times per year  . Marital Status: Married    Tobacco Counseling Counseling given: Not Answered   Clinical Intake:  Pre-visit preparation completed: Yes  Pain : No/denies pain     Nutritional Risks: None Diabetes: Yes CBG done?: No Did pt. bring in CBG monitor from home?: No  How often do you need  to have someone help you when you read instructions, pamphlets, or other written materials from your doctor or pharmacy?: 1 - Never What is the last grade level you completed in school?: Associate's degree  Diabetes:  Is the patient diabetic?  Yes  If diabetic, was a CBG obtained today?  No  Did the patient bring in their glucometer from home?  No phone visit How often do you monitor your CBG's? daily  Financial Strains and Diabetes Management:  Are you having any financial strains with the device, your supplies or your medication? No .  Does the patient want to be seen by Chronic Care Management for management of their diabetes?  No  Would the patient like to be referred to a Nutritionist or for Diabetic Management?  No   Diabetic Exams:  Diabetic Eye Exam: Completed 09/09/2019.   Diabetic Foot Exam: Completed 02/14/2019.   Interpreter Needed?: No  Information entered by :: Caroleen Hamman LPN   Activities of Daily Living In your present state of  health, do you have any difficulty performing the following activities: 02/05/2020  Hearing? Y  Comment hearing aids  Vision? N  Difficulty concentrating or making decisions? N  Walking or climbing stairs? N  Dressing or bathing? N  Doing errands, shopping? N  Preparing Food and eating ? N  Using the Toilet? N  In the past six months, have you accidently leaked urine? Y  Do you have problems with loss of bowel control? Y  Comment pt states she has talked with PCP about this  Managing your Medications? N  Managing your Finances? N  Housekeeping or managing your Housekeeping? N  Some recent data might be hidden    Patient Care Team: Tammi Sou, MD as PCP - General (Family Medicine) Melida Quitter, MD as Consulting Physician (Otolaryngology) Bobbitt, [place] Muta, MD as Consulting Physician (Allergy and Immunology)  Indicate any recent Medical Services you may have received from other than [facility] providers in the past year (date may be approximate).     Assessment:   This is a routine wellness examination for Mackenzie Wood.  Hearing/Vision screen  Hearing Screening   125Hz  250Hz  500Hz  1000Hz  2000Hz  3000Hz  4000Hz  6000Hz  8000Hz   Right ear:           Left ear:           Comments: Wears hearing aids  Vision Screening Comments: Wears glasses Last eye exam-09/09/19-Dr. Mendel Ryder  Dietary issues and exercise activities discussed: Current Exercise Habits: Home exercise routine, Type of exercise: walking, Time (Minutes): 45, Frequency (Times/Week): 4, Weekly Exercise (Minutes/Week): 180, Intensity: Mild, Exercise limited by: None identified  Goals    . Patient Stated     Continue drinking water, being active & eating healthy      Depression Screen PHQ 2/9 Scores 02/05/2020 05/29/2018 12/05/2017 01/13/2017 01/13/2017 01/08/2016 09/08/2015  PHQ - 2 Score 2 0 0 - - 0 0  PHQ- 9 Score 2 2 - - - - -  Exception Documentation - - - Patient refusal Patient refusal - -    Fall Risk Fall Risk   02/05/2020 02/20/2019 10/15/2018 12/05/2017 01/13/2017  Falls in the past year? 0 0 0 No No  Comment - Emmi Telephone Survey: data to providers prior to load - - -  Number falls in past yr: 0 - 0 - -  Injury with Fall? 0 - 0 - -  Follow up Falls prevention discussed - Falls evaluation completed - -    Any stairs  in or around the home? Yes  If so, are there any without handrails? No  Home free of loose throw rugs in walkways, pet beds, electrical cords, etc? Yes  Adequate lighting in your home to reduce risk of falls? Yes   ASSISTIVE DEVICES UTILIZED TO PREVENT FALLS:  Life alert? No  Use of a cane, walker or w/c? No  Grab bars in the bathroom? Yes  Shower chair or bench in shower? No  Elevated toilet seat or a handicapped toilet? No   TIMED UP AND GO:  Was the test performed? No . Phone visit   Cognitive Function:No cognitive impairment noted        Immunizations Immunization History  Administered Date(s) Administered  . Fluad Quad(high Dose 65+) 01/16/2019  . Influenza Split 03/26/2010, 12/27/2011  . Influenza, High Dose Seasonal PF 01/02/2015, 01/08/2016, 01/13/2017  . Influenza,inj,Quad PF,6+ Mos 03/04/2013, 03/03/2014  . Influenza-Unspecified 03/25/2011, 01/26/2018  . PFIZER SARS-COV-2 Vaccination 05/12/2019, 06/04/2019  . PPD Test 03/05/2008  . Pneumococcal Conjugate-13 09/08/2015  . Pneumococcal Polysaccharide-23 07/07/2014  . Pneumococcal-Unspecified 03/26/2009  . Tdap 03/28/2006  . Zoster 03/26/2009    TDAP status: Due, Education has been provided regarding the importance of this vaccine. Advised may receive this vaccine at local pharmacy or Health Dept. Aware to provide a copy of the vaccination record if obtained from local pharmacy or Health Dept. Verbalized acceptance and understanding.   Flu vaccine: Due- Patient plans to obtain vaccine soon.  Pneumococcal vaccine status: Up to date   Covid-19 vaccine status: Completed vaccines  Qualifies for  Shingles Vaccine? Yes   Zostavax completed Yes   Shingrix Completed?: No.    Education has been provided regarding the importance of this vaccine. Patient has been advised to call insurance company to determine out of pocket expense if they have not yet received this vaccine. Advised may also receive vaccine at local pharmacy or Health Dept. Verbalized acceptance and understanding.  Screening Tests Health Maintenance  Topic Date Due  . INFLUENZA VACCINE  10/27/2019  . MAMMOGRAM  12/11/2019  . FOOT EXAM  02/14/2020  . HEMOGLOBIN A1C  05/03/2020  . OPHTHALMOLOGY EXAM  09/08/2020  . DEXA SCAN  Completed  . COVID-19 Vaccine  Completed  . PNA vac Low Risk Adult  Completed  . TETANUS/TDAP  Discontinued  . Hepatitis C Screening  Discontinued    Health Maintenance  Health Maintenance Due  Topic Date Due  . INFLUENZA VACCINE  10/27/2019  . MAMMOGRAM  12/11/2019    Colorectal cancer screening: No longer required.    Mammogram status: Completed Bilateral-02/04/2020. Repeat every year   Bone Density status: Declined  Lung Cancer Screening: (Low Dose CT Chest recommended if Age 47-80 years, 30 pack-year currently smoking OR have quit w/in 15years.) does not qualify.     Additional Screening:  Hepatitis C Screening: does qualify  Vision Screening: Recommended annual ophthalmology exams for early detection of glaucoma and other disorders of the eye. Is the patient up to date with their annual eye exam?  Yes  Who is the provider or what is the name of the office in which the patient attends annual eye exams? Dr. Mendel Ryder    Dental Screening: Recommended annual dental exams for proper oral hygiene  Community Resource Referral / Chronic Care Management: CRR required this visit?  No   CCM required this visit?  No      Plan:     I have personally reviewed and noted the following in the patient's chart:   .  Medical and social history . Use of alcohol, tobacco or illicit drugs    . Current medications and supplements . Functional ability and status . Nutritional status . Physical activity . Advanced directives . List of other physicians . Hospitalizations, surgeries, and ER visits in previous 12 months . Vitals . Screenings to include cognitive, depression, and falls . Referrals and appointments  In addition, I have reviewed and discussed with patient certain preventive protocols, quality metrics, and best practice recommendations. A written personalized care plan for preventive services as well as general preventive health recommendations were provided to patient.   Due to this being a telephonic visit, the after visit summary with patients personalized plan was offered to patient via mail or my-chart.  Patient would like to access on my-chart.   Marta Antu, LPN   [id number]  Nurse Health Advisor  Nurse Notes: None

## 2020-02-05 ENCOUNTER — Ambulatory Visit (INDEPENDENT_AMBULATORY_CARE_PROVIDER_SITE_OTHER): Payer: Medicare Other

## 2020-02-05 VITALS — Ht 59.0 in | Wt 131.0 lb

## 2020-02-05 DIAGNOSIS — Z0000 Encounter for general adult medical examination without abnormal findings: Secondary | ICD-10-CM

## 2020-02-05 NOTE — Patient Instructions (Signed)
Mackenzie Wood , Thank you for taking time to complete your Medicare Wellness Visit. I appreciate your ongoing commitment to your health goals. Please review the following plan we discussed and let me know if I can assist you in the future.   Screening recommendations/referrals: Colonoscopy: No longer required Mammogram: Completed 02/04/2020- Due 02/03/2021 Bone Density: Declined Recommended yearly ophthalmology/optometry visit for glaucoma screening and checkup Recommended yearly dental visit for hygiene and checkup  Vaccinations: Influenza vaccine: Due- May obtain vaccine at our office or your local pharmacy Pneumococcal vaccine: Completed vaccines Tdap vaccine: Discuss with pharmacy Shingles vaccine: Discuss with pharmacy   Covid-19:Completed vaccines  Advanced directives: Please bring a copy for your chart  Conditions/risks identified: See problem list  Next appointment: Follow up in one year for your annual wellness visit    Preventive Care 65 Years and Older, Female Preventive care refers to lifestyle choices and visits with your health care provider that can promote health and wellness. What does preventive care include?  A yearly physical exam. This is also called an annual well check.  Dental exams once or twice a year.  Routine eye exams. Ask your health care provider how often you should have your eyes checked.  Personal lifestyle choices, including:  Daily care of your teeth and gums.  Regular physical activity.  Eating a healthy diet.  Avoiding tobacco and drug use.  Limiting alcohol use.  Practicing safe sex.  Taking low-dose aspirin every day.  Taking vitamin and mineral supplements as recommended by your health care provider. What happens during an annual well check? The services and screenings done by your health care provider during your annual well check will depend on your age, overall health, lifestyle risk factors, and family history of  disease. Counseling  Your health care provider may ask you questions about your:  Alcohol use.  Tobacco use.  Drug use.  Emotional well-being.  Home and relationship well-being.  Sexual activity.  Eating habits.  History of falls.  Memory and ability to understand (cognition).  Work and work Statistician.  Reproductive health. Screening  You may have the following tests or measurements:  Height, weight, and BMI.  Blood pressure.  Lipid and cholesterol levels. These may be checked every 5 years, or more frequently if you are over 12 years old.  Skin check.  Lung cancer screening. You may have this screening every year starting at age 19 if you have a 30-pack-year history of smoking and currently smoke or have quit within the past 15 years.  Fecal occult blood test (FOBT) of the stool. You may have this test every year starting at age 76.  Flexible sigmoidoscopy or colonoscopy. You may have a sigmoidoscopy every 5 years or a colonoscopy every 10 years starting at age 72.  Hepatitis C blood test.  Hepatitis B blood test.  Sexually transmitted disease (STD) testing.  Diabetes screening. This is done by checking your blood sugar (glucose) after you have not eaten for a while (fasting). You may have this done every 1-3 years.  Bone density scan. This is done to screen for osteoporosis. You may have this done starting at age 76.  Mammogram. This may be done every 1-2 years. Talk to your health care provider about how often you should have regular mammograms. Talk with your health care provider about your test results, treatment options, and if necessary, the need for more tests. Vaccines  Your health care provider may recommend certain vaccines, such as:  Influenza vaccine. This  is recommended every year.  Tetanus, diphtheria, and acellular pertussis (Tdap, Td) vaccine. You may need a Td booster every 10 years.  Zoster vaccine. You may need this after age  76.  Pneumococcal 13-valent conjugate (PCV13) vaccine. One dose is recommended after age 76.  Pneumococcal polysaccharide (PPSV23) vaccine. One dose is recommended after age 21. Talk to your health care provider about which screenings and vaccines you need and how often you need them. This information is not intended to replace advice given to you by your health care provider. Make sure you discuss any questions you have with your health care provider. Document Released: 04/10/2015 Document Revised: 12/02/2015 Document Reviewed: 01/13/2015 Elsevier Interactive Patient Education  2017 [place] Prevention in the Home Falls can cause injuries. They can happen to people of all ages. There are many things you can do to make your home safe and to help prevent falls. What can I do on the outside of my home?  Regularly fix the edges of walkways and driveways and fix any cracks.  Remove anything that might make you trip as you walk through a door, such as a raised step or threshold.  Trim any bushes or trees on the path to your home.  Use bright outdoor lighting.  Clear any walking paths of anything that might make someone trip, such as rocks or tools.  Regularly check to see if handrails are loose or broken. Make sure that both sides of any steps have handrails.  Any raised decks and porches should have guardrails on the edges.  Have any leaves, snow, or ice cleared regularly.  Use sand or salt on walking paths during winter.  Clean up any spills in your garage right away. This includes oil or grease spills. What can I do in the bathroom?  Use night lights.  Install grab bars by the toilet and in the tub and shower. Do not use towel bars as grab bars.  Use non-skid mats or decals in the tub or shower.  If you need to sit down in the shower, use a plastic, non-slip stool.  Keep the floor dry. Clean up any water that spills on the floor as soon as it happens.  Remove  soap buildup in the tub or shower regularly.  Attach bath mats securely with double-sided non-slip rug tape.  Do not have throw rugs and other things on the floor that can make you trip. What can I do in the bedroom?  Use night lights.  Make sure that you have a light by your bed that is easy to reach.  Do not use any sheets or blankets that are too big for your bed. They should not hang down onto the floor.  Have a firm chair that has side arms. You can use this for support while you get dressed.  Do not have throw rugs and other things on the floor that can make you trip. What can I do in the kitchen?  Clean up any spills right away.  Avoid walking on wet floors.  Keep items that you use a lot in easy-to-reach places.  If you need to reach something above you, use a strong step stool that has a grab bar.  Keep electrical cords out of the way.  Do not use floor polish or wax that makes floors slippery. If you must use wax, use non-skid floor wax.  Do not have throw rugs and other things on the floor that can make you  trip. What can I do with my stairs?  Do not leave any items on the stairs.  Make sure that there are handrails on both sides of the stairs and use them. Fix handrails that are broken or loose. Make sure that handrails are as long as the stairways.  Check any carpeting to make sure that it is firmly attached to the stairs. Fix any carpet that is loose or worn.  Avoid having throw rugs at the top or bottom of the stairs. If you do have throw rugs, attach them to the floor with carpet tape.  Make sure that you have a light switch at the top of the stairs and the bottom of the stairs. If you do not have them, ask someone to add them for you. What else can I do to help prevent falls?  Wear shoes that:  Do not have high heels.  Have rubber bottoms.  Are comfortable and fit you well.  Are closed at the toe. Do not wear sandals.  If you use a  stepladder:  Make sure that it is fully opened. Do not climb a closed stepladder.  Make sure that both sides of the stepladder are locked into place.  Ask someone to hold it for you, if possible.  Clearly mark and make sure that you can see:  Any grab bars or handrails.  First and last steps.  Where the edge of each step is.  Use tools that help you move around (mobility aids) if they are needed. These include:  Canes.  Walkers.  Scooters.  Crutches.  Turn on the lights when you go into a dark area. Replace any light bulbs as soon as they burn out.  Set up your furniture so you have a clear path. Avoid moving your furniture around.  If any of your floors are uneven, fix them.  If there are any pets around you, be aware of where they are.  Review your medicines with your doctor. Some medicines can make you feel dizzy. This can increase your chance of falling. Ask your doctor what other things that you can do to help prevent falls. This information is not intended to replace advice given to you by your health care provider. Make sure you discuss any questions you have with your health care provider. Document Released: 01/08/2009 Document Revised: 08/20/2015 Document Reviewed: 04/18/2014 Elsevier Interactive Patient Education  2017 [employer].

## 2020-02-17 ENCOUNTER — Telehealth: Payer: Self-pay | Admitting: Family Medicine

## 2020-02-17 NOTE — Telephone Encounter (Signed)
Patient states on Saturday, one of her toes was feeling sensitive, was a little red and swollen. She reports no open wound and she did not stub it on anything. Today, it is less pink and just the tip around the nail is swollen. Patient would like to speak with someone for advice before she makes an appt. Perhaps this is something she can resolve at home.

## 2020-02-17 NOTE — Telephone Encounter (Signed)
Spoke with patient and she wanted to know if she could soak her toe in epsom salt. Denies pain, sensitivity to touch or major inflammation. Minor swelling around tip around the nail.  Please advise if ok to soak in epsom salt or something else needed.

## 2020-02-26 DIAGNOSIS — Z23 Encounter for immunization: Secondary | ICD-10-CM | POA: Diagnosis not present

## 2020-02-28 ENCOUNTER — Other Ambulatory Visit: Payer: Self-pay | Admitting: Family Medicine

## 2020-03-17 ENCOUNTER — Other Ambulatory Visit: Payer: Self-pay

## 2020-03-17 ENCOUNTER — Ambulatory Visit (INDEPENDENT_AMBULATORY_CARE_PROVIDER_SITE_OTHER): Payer: Medicare Other

## 2020-03-17 DIAGNOSIS — Z23 Encounter for immunization: Secondary | ICD-10-CM

## 2020-03-30 ENCOUNTER — Other Ambulatory Visit: Payer: Self-pay | Admitting: Family Medicine

## 2020-03-30 DIAGNOSIS — E119 Type 2 diabetes mellitus without complications: Secondary | ICD-10-CM

## 2020-04-29 ENCOUNTER — Other Ambulatory Visit: Payer: Self-pay

## 2020-04-30 ENCOUNTER — Encounter: Payer: Self-pay | Admitting: Family Medicine

## 2020-04-30 ENCOUNTER — Ambulatory Visit (INDEPENDENT_AMBULATORY_CARE_PROVIDER_SITE_OTHER): Payer: Medicare Other | Admitting: Family Medicine

## 2020-04-30 VITALS — BP 105/70 | HR 73 | Temp 97.6°F | Resp 16 | Ht 59.0 in | Wt 134.0 lb

## 2020-04-30 DIAGNOSIS — E119 Type 2 diabetes mellitus without complications: Secondary | ICD-10-CM

## 2020-04-30 DIAGNOSIS — I10 Essential (primary) hypertension: Secondary | ICD-10-CM

## 2020-04-30 DIAGNOSIS — E039 Hypothyroidism, unspecified: Secondary | ICD-10-CM | POA: Diagnosis not present

## 2020-04-30 LAB — COMPREHENSIVE METABOLIC PANEL
ALT: 16 U/L (ref 0–35)
AST: 16 U/L (ref 0–37)
Albumin: 4.6 g/dL (ref 3.5–5.2)
Alkaline Phosphatase: 58 U/L (ref 39–117)
BUN: 19 mg/dL (ref 6–23)
CO2: 32 mEq/L (ref 19–32)
Calcium: 9.8 mg/dL (ref 8.4–10.5)
Chloride: 99 mEq/L (ref 96–112)
Creatinine, Ser: 0.78 mg/dL (ref 0.40–1.20)
GFR: 73.58 mL/min (ref >60.00)
Glucose, Bld: 95 mg/dL (ref 70–99)
Potassium: 3.9 mEq/L (ref 3.5–5.1)
Sodium: 138 mEq/L (ref 135–145)
Total Bilirubin: 0.3 mg/dL (ref 0.2–1.2)
Total Protein: 7 g/dL (ref 6.0–8.3)

## 2020-04-30 LAB — LIPID PANEL
Cholesterol: 234 mg/dL — ABNORMAL HIGH (ref 0–200)
HDL: 51.2 mg/dL (ref >39.00)
LDL Cholesterol: 150 mg/dL — ABNORMAL HIGH (ref 0–99)
NonHDL: 182.46
Total CHOL/HDL Ratio: 5
Triglycerides: 161 mg/dL — ABNORMAL HIGH (ref 0.0–149.0)
VLDL: 32.2 mg/dL (ref 0.0–40.0)

## 2020-04-30 LAB — MICROALBUMIN / CREATININE URINE RATIO
Creatinine,U: 128.4 mg/dL
Microalb Creat Ratio: 0.9 mg/g (ref 0.0–30.0)
Microalb, Ur: 1.1 mg/dL (ref 0.0–1.9)

## 2020-04-30 LAB — TSH: TSH: 1.25 u[IU]/mL (ref 0.35–4.50)

## 2020-04-30 LAB — HEMOGLOBIN A1C: Hgb A1c MFr Bld: 6.8 % — ABNORMAL HIGH (ref 4.6–6.5)

## 2020-04-30 NOTE — Progress Notes (Signed)
CC: 6 month f/u DM2, HTN, and Hypothyroidism  HPI:  Mackenzie Wood is a 75 y.o. White female who is here for 6 mo f/u DM2, HTN, hypothyroidism.  Patient with no acute complaints. Patient reports that she eats a well balanced diet, and is stringent on the amount of carbohydrates she eats daily. Patient monitors fasting blood glucose daily with measurements averaging 120.   Additionally, patient occasionally monitors BP at home. Home BP averaging 120/70 over past 6 months.   Patient with hx of hypothyroidism and currently on synthyroid for management. Will initially starting thyroid medication, patient notes weight loss. Patient slightly concerned about weight gain since last visit (3 lbs in 6 months) given she has been monitoring diet and exercising appropriately. Patient denies fatigue, constipation, diarrhea, palpitations, and heat/cold intolerance.  Of note, patient diagnosed with upper airway cough syndrome potentially thought to be due to reflux as cough dry and unproductive. Pantoprazole trial with no effect. Patient reports since last visit she coughed up a green mucus plug with a brown hard object in the center the patient describes as similar to an "apple stem." Patient has not had cough since. Patient denies hematemesis or blood-streaked sputum.  ROS: no fevers, no CP, no SOB, no wheezing, no cough, no dizziness, no HAs, no rashes.  No polyuria or polydipsia.  No myalgias or arthralgias.  No focal weakness, paresthesias, or tremors.  No acute vision or hearing abnormalities. No n/v/d or abd pain.  No palpitations.     PMH:  Past Medical History:  Diagnosis Date  . Angioedema 01/2018   Autoimmune urticaria/angioedema-->needs chronic nonsedating antihistamine and an H2 blocker indefinitely.  1 episode ->ASA and ARB d/c'd as precaution.  Then had another episode a couple weeks later.  Another episode just prior to seen allergist--allergist did blood workup/allergy testing->dx of  autoimmune urticaria.  . Beta thalassemia, heterozygous    vs alpha thalassemia minor (pt's family originally from [place]).  Marland Kitchen CAP (community acquired pneumonia) 02/2010  . Colon cancer screening    Pt declines colonoscopy.  Cologuard negative 01/2018-->repeat 3 yrs.  . Congenital sensorineural hearing loss    deaf in right ear; mild presbycusis in left ear (audiologist 2014)  . Depression   . HTN (hypertension)   . Hyperkalemia 07/17/2017   Decreased ARB dose, added low dose hctz, low K diet---repeat K normal.  . Hyperlipidemia, mixed    Refuses statins  . Hypothyroidism   . Osteopenia    DEXAs 2003-2013--pt has declined bisphosphonates  . Somnolence, daytime, controlled 04/22/2012  . Type II or unspecified type diabetes mellitus without mention of complication, uncontrolled   . Vitamin D deficiency     M/A: Current Outpatient Medications on File Prior to Visit  Medication Sig Dispense Refill  . amLODipine (NORVASC) 10 MG tablet TAKE 1 TABLET DAILY 90 tablet 1  . Calcium Carbonate-Vitamin D 600-400 MG-UNIT tablet Take 1 tablet by mouth 2 (two) times daily.    . [facility] Lancets Ultra-Thin 30G MISC USE TO TEST BLOOD SUGAR ONCE DAILY DX11.9 100 each 2  . glucose blood (ONETOUCH ULTRA) test strip TEST 1 EACH BY OTHER ROUTE DAILY E11.9 100 strip 2  . hydrochlorothiazide (HYDRODIURIL) 25 MG tablet TAKE 1 TABLET DAILY 90 tablet 1  . levothyroxine (SYNTHROID) 88 MCG tablet Take 1 tablet (88 mcg total) by mouth daily. 90 tablet 3  . lisinopril (ZESTRIL) 5 MG tablet TAKE 1 TABLET DAILY 90 tablet 1  . metFORMIN (GLUCOPHAGE) 500 MG tablet TAKE 1 TABLET  TWICE A DAY 180 tablet 1  . Omega-3 Fatty Acids (FISH OIL) 1000 MG CAPS Take 1 capsule by mouth 2 (two) times daily.    Marland Kitchen EPINEPHrine 0.3 mg/0.3 mL IJ SOAJ injection INJECT 0.3 MLS (0.3 MG TOTAL) INTO THE MUSCLE ONCE FOR 1 DOSE. (Patient not taking: Reported on 04/30/2020)  1  . fexofenadine (ALLEGRA) 180 MG tablet Take 1 tablet (180 mg total) by mouth  daily. (Patient not taking: No sig reported) 90 tablet 3  . hyoscyamine (LEVSIN, ANASPAZ) 0.125 MG tablet 1-2 tabs q4h prn (Patient not taking: Reported on 04/30/2020) 30 tablet 2   No current facility-administered medications on file prior to visit.   No Known Allergies  FH:  Family History  Problem Relation Age of Onset  . Hypertension Mother   . Hypertension Father     SH: Social History   Socioeconomic History  . Marital status: Married    Spouse name: Not on file  . Number of children: Not on file  . Years of education: Not on file  . Highest education level: Not on file  Occupational History  . Not on file  Tobacco Use  . Smoking status: Never Smoker  . Smokeless tobacco: Never Used  Vaping Use  . Vaping Use: Never used  Substance and Sexual Activity  . Alcohol use: No  . Drug use: No  . Sexual activity: Not on file  Other Topics Concern  . Not on file  Social History Narrative   Married, 2 daughters, 3 grandchildren.   Retired Archivist.  Relocated from [place] around 2010.   No T/A/Ds.  Exercise: sedentary life + no formal exercise at all.   Diet: regular            Social Determinants of Health   Financial Resource Strain: Low Risk   . Difficulty of Paying Living Expenses: Not hard at all  Food Insecurity: No Food Insecurity  . Worried About Charity fundraiser in the Last Year: Never true  . Ran Out of Food in the Last Year: Never true  Transportation Needs: No Transportation Needs  . Lack of Transportation (Medical): No  . Lack of Transportation (Non-Medical): No  Physical Activity: Sufficiently Active  . Days of Exercise per Week: 4 days  . Minutes of Exercise per Session: 40 min  Stress: No Stress Concern Present  . Feeling of Stress : Not at all  Social Connections: Socially Integrated  . Frequency of Communication with Friends and Family: More than three times a week  . Frequency of Social Gatherings with Friends and Family: Once a  week  . Attends Religious Services: More than 4 times per year  . Active Member of Clubs or Organizations: Yes  . Attends Archivist Meetings: More than 4 times per year  . Marital Status: Married    ROS: Review of Systems  Constitutional: Negative for fever.    PE: Vitals with BMI 04/30/2020 02/05/2020 11/01/2019  Height 4\' 11"  4\' 11"  4\' 11"   Weight 134 lbs 131 lbs 131 lbs 10 oz  BMI 27.05 [id number]  Systolic 123456 - 123456  Diastolic 70 - 73  Pulse 73 - 62    Physical Exam Constitutional:      Appearance: Normal appearance.  HENT:     Head: Normocephalic and atraumatic.  Cardiovascular:     Rate and Rhythm: Normal rate and regular rhythm.     Pulses:          Dorsalis  pedis pulses are 2+ on the right side and 2+ on the left side.       Posterior tibial pulses are 2+ on the right side and 2+ on the left side.     Heart sounds: Normal heart sounds. No murmur heard. No friction rub. No gallop.   Pulmonary:     Breath sounds: Normal breath sounds.  Musculoskeletal:     Right lower leg: No edema.     Left lower leg: No edema.     Right foot: Normal.     Left foot: Normal.  Neurological:     Mental Status: She is alert.  Psychiatric:        Mood and Affect: Mood normal.     Labs: Lab Results  Component Value Date   HGBA1C 6.9 (H) 11/01/2019     Chemistry      Component Value Date/Time   NA 139 11/01/2019 1008   NA 139 02/27/2018 1314   K 4.1 11/01/2019 1008   CL 100 11/01/2019 1008   CO2 31 11/01/2019 1008   BUN 19 11/01/2019 1008   BUN 19 02/27/2018 1314   CREATININE 0.87 11/01/2019 1008   CREATININE 0.77 05/29/2018 1028      Component Value Date/Time   CALCIUM 9.9 11/01/2019 1008   ALKPHOS 58 02/14/2019 0921   AST 14 02/14/2019 0921   ALT 14 02/14/2019 0921   BILITOT 0.5 02/14/2019 0921   BILITOT <0.2 02/27/2018 1314     Lab Results  Component Value Date   CHOL 198 01/06/2016   HDL 49.90 01/06/2016   LDLCALC 126 (H) 03/03/2014    LDLDIRECT 126.0 01/06/2016   TRIG 202.0 (H) 01/06/2016   CHOLHDL 4 01/06/2016   A/P: In summary, Mackenzie Wood is a 75 y.o. year old female who presents to the clinic for 6 month follow up for DM2, HTN, and Hypothyroidism. Physical exam unremarkable.  1) DM 2, well controlled. Diabetic foot exam wnl, DP and PT pulses 2+ bilaterally, sensation to pin prick intact throughout (5 distinct locations) foot, bilaterally. Fasting home BS averaging 120. - Continue metformin 500 mg BID. - Hgb A1c today. - micoalbumin/cr today. - lytes/cr today.  2) HTN:The current medical regimen is effective; home BP averaging 120/70. BP in clinic today 105/70. Continue present plan and medications. - Continue amlodipine 10 mg qd. - Continue hydrochlorothiazide 25 mg qd. - lytes/cr today.  3) Hypothyroidism: patient tolerating synthyroid well. Patient mildly concerned about 3lb weight gain in last 6 months (after losing 15 lbs when initially starting synthyroid) despite monitoring diet and exercising appropriately. Discussed with patient that this is likely benign and nothing to worry about at this time as she is doing appropriate lifestyle management (diet/exercise) and we are monitoring thyroid function. - Continue Levothyroxine 88 mcg qd. - TSH today.  3) Cough: resolved. Appears that etiology likely obstruction via foreign object vs mucus plug. Patient has not had cough or respiratory systems since expelling mucus plug with "apple stem"-like hard object in center. Lungs CTABL, respiratory effort normal.  4) Preventative health care: Colon ca screening: plan rpt cologuard 01/2021. Vaccines->Shingrix -> patient would like time to consider (discussed benefits/risks of vaccine).  Otherwise all UTD. Breast ca screening: next mammogram 01/2021. Osteoporosis screening: last DEXA was 2013, T score -1.7 at that time->pt declines any further screening.   Lab Orders     Hemoglobin A1c     TSH     Lipid  panel     Comprehensive metabolic  panel     Microalbumin / creatinine urine ratio   Follow Up:  6 months for DM2, HTN, and Hypothyroidism  Signed: Nanetta Batty, MS3  I personally was present during the history, physical exam, and medical decision-making activities of this service and have verified that the service and findings are accurately documented in the student's note. Signed:  Crissie Sickles, MD           04/30/2020

## 2020-04-30 NOTE — Progress Notes (Signed)
See student note from this date. Signed:  Phil Saurav Crumble, MD           04/30/2020  

## 2020-06-17 ENCOUNTER — Other Ambulatory Visit: Payer: Self-pay

## 2020-06-17 ENCOUNTER — Ambulatory Visit (INDEPENDENT_AMBULATORY_CARE_PROVIDER_SITE_OTHER): Payer: Medicare Other | Admitting: Family Medicine

## 2020-06-17 ENCOUNTER — Encounter: Payer: Self-pay | Admitting: Family Medicine

## 2020-06-17 VITALS — BP 103/65 | HR 80 | Temp 97.9°F | Resp 16 | Ht 59.0 in | Wt 135.6 lb

## 2020-06-17 DIAGNOSIS — M7541 Impingement syndrome of right shoulder: Secondary | ICD-10-CM | POA: Diagnosis not present

## 2020-06-17 DIAGNOSIS — I959 Hypotension, unspecified: Secondary | ICD-10-CM

## 2020-06-17 DIAGNOSIS — I10 Essential (primary) hypertension: Secondary | ICD-10-CM | POA: Diagnosis not present

## 2020-06-17 DIAGNOSIS — M25511 Pain in right shoulder: Secondary | ICD-10-CM | POA: Diagnosis not present

## 2020-06-17 DIAGNOSIS — M6289 Other specified disorders of muscle: Secondary | ICD-10-CM | POA: Diagnosis not present

## 2020-06-17 NOTE — Progress Notes (Signed)
OFFICE VISIT  06/17/2020  CC:  Chief Complaint  Patient presents with  . Right shoulder pain    3-4 weeks, tried taking tylenol for relief last week    HPI:    Patient is a 75 y.o. [name] who presents for right shoulder pain. Onset R anterior shoulder pain, ant upper arm pain about 1 mo ago. No preceding trauma/injury or overuse.  Hurts when raises arm up/reaches/rotates.   She noted some dec strength in R arm and some tremulousness in  R arm when trying to hold/lift things.  Turning steering wheel makes pain worse.  Occ brief sharp, severe pain several days ago with moving shoulder.  Took tylenol and rested it and this improved some. No tingling/numbness in arm.  No neck pain.    Also brings up concern of a vague sense of muscle weakness. Unclear how long but says "lately" and says it had occurred in the remote past before I was her doctor and neuro w/u unrevealing and neurologist said it was her bp med but PCP said he didn't agree.  Unclear if just spont resolved or what. Pt denies aching or pain in muscles, denies double vision or blurry vision.  Not clear what makes it worse or alleviates it.   Has not checked bp since last o/v with me.  Takes lisin 5mg  qd, hctz 25 qd, amlodipine 10mg  qd.   ROS as above, plus--> no fevers, no CP, no SOB, no wheezing, no cough, no dizziness, no HAs, no rashes, no melena/hematochezia.  No polyuria or polydipsia.  No myalgias or arthralgias.  No  paresthesias or tremors.  No acute vision or hearing abnormalities.  No dysuria or unusual/new urinary urgency or frequency.  No recent changes in lower legs. No n/v/d or abd pain.  No palpitations.     Past Medical History:  Diagnosis Date  . Angioedema 01/2018   Autoimmune urticaria/angioedema-->needs chronic nonsedating antihistamine and an H2 blocker indefinitely.  1 episode ->ASA and ARB d/c'd as precaution.  Then had another episode a couple weeks later.  Another episode just prior to seen  allergist--allergist did blood workup/allergy testing->dx of autoimmune urticaria.  . Beta thalassemia, heterozygous    vs alpha thalassemia minor (pt's family originally from [place]).  Marland Kitchen CAP (community acquired pneumonia) 02/2010  . Colon cancer screening    Pt declines colonoscopy.  Cologuard negative 01/2018-->repeat 3 yrs.  . Congenital sensorineural hearing loss    deaf in right ear; mild presbycusis in left ear (audiologist 2014)  . Depression   . HTN (hypertension)   . Hyperkalemia 07/17/2017   Decreased ARB dose, added low dose hctz, low K diet---repeat K normal.  . Hyperlipidemia, mixed    Refuses statins  . Hypothyroidism   . Osteopenia    DEXAs 2003-2013--pt has declined bisphosphonates  . Somnolence, daytime, controlled 04/22/2012  . Type II or unspecified type diabetes mellitus without mention of complication, uncontrolled   . Vitamin D deficiency     Past Surgical History:  Procedure Laterality Date  . BREAST BIOPSY     Left breast biopsy x 2 (fibrocystic/benign)  . COLONOSCOPY  2008   Normal.  10 yr recall (initial colonoscopy done in [place]--needs referral to local GI in 2018.  . TONSILLECTOMY AND ADENOIDECTOMY     as a child    Outpatient Medications Prior to Visit  Medication Sig Dispense Refill  . Calcium Carbonate-Vitamin D 600-400 MG-UNIT tablet Take 1 tablet by mouth 2 (two) times daily.    Marland Kitchen  [facility] Lancets Ultra-Thin 30G MISC USE TO TEST BLOOD SUGAR ONCE DAILY DX11.9 100 each 2  . glucose blood (ONETOUCH ULTRA) test strip TEST 1 EACH BY OTHER ROUTE DAILY E11.9 100 strip 2  . hydrochlorothiazide (HYDRODIURIL) 25 MG tablet TAKE 1 TABLET DAILY 90 tablet 1  . levothyroxine (SYNTHROID) 88 MCG tablet Take 1 tablet (88 mcg total) by mouth daily. 90 tablet 3  . lisinopril (ZESTRIL) 5 MG tablet TAKE 1 TABLET DAILY 90 tablet 1  . metFORMIN (GLUCOPHAGE) 500 MG tablet TAKE 1 TABLET TWICE A DAY 180 tablet 1  . Omega-3 Fatty Acids (FISH OIL) 1000 MG CAPS Take 1  capsule by mouth 2 (two) times daily.    Marland Kitchen amLODipine (NORVASC) 10 MG tablet TAKE 1 TABLET DAILY 90 tablet 1  . EPINEPHrine 0.3 mg/0.3 mL IJ SOAJ injection INJECT 0.3 MLS (0.3 MG TOTAL) INTO THE MUSCLE ONCE FOR 1 DOSE. (Patient not taking: No sig reported)  1  . fexofenadine (ALLEGRA) 180 MG tablet Take 1 tablet (180 mg total) by mouth daily. (Patient not taking: No sig reported) 90 tablet 3  . hyoscyamine (LEVSIN, ANASPAZ) 0.125 MG tablet 1-2 tabs q4h prn (Patient not taking: No sig reported) 30 tablet 2   No facility-administered medications prior to visit.    No Known Allergies  ROS As per HPI  PE: Vitals with BMI 06/17/2020 04/30/2020 02/05/2020  Height 4\' 11"  4\' 11"  4\' 11"   Weight 135 lbs 10 oz 134 lbs 131 lbs  BMI 27.37 16.10 96.04  Systolic 540 981 -  Diastolic 65 70 -  Pulse 80 73 -     Gen: Alert, well appearing.  Patient is oriented to person, place, time, and situation. AFFECT: pleasant, lucid thought and speech. R shoulder w/out any palpable tenderness. Speeds and yergason's testing neg.  +Impingement sign, +supraspinatus sign, minimal discomfort on resisted ER and IR.  She can ABduct R arm only to 80 deg.  Neg drop sign. Grip strength 5/5 bilat.   LABS:    Chemistry      Component Value Date/Time   NA 138 04/30/2020 1049   NA 139 02/27/2018 1314   K 3.9 04/30/2020 1049   CL 99 04/30/2020 1049   CO2 32 04/30/2020 1049   BUN 19 04/30/2020 1049   BUN 19 02/27/2018 1314   CREATININE 0.78 04/30/2020 1049   CREATININE 0.77 05/29/2018 1028      Component Value Date/Time   CALCIUM 9.8 04/30/2020 1049   ALKPHOS 58 04/30/2020 1049   AST 16 04/30/2020 1049   ALT 16 04/30/2020 1049   BILITOT 0.3 04/30/2020 1049   BILITOT <0.2 02/27/2018 1314     Lab Results  Component Value Date   HGBA1C 6.8 (H) 04/30/2020   Lab Results  Component Value Date   WBC 6.8 02/27/2018   HGB 12.5 02/27/2018   HCT 39.2 02/27/2018   MCV 63 (L) 02/27/2018   PLT 250.0 07/17/2017    Lab Results  Component Value Date   TSH 1.25 04/30/2020    IMPRESSION AND PLAN:  1) Right RC impingement syndrome: quite limited in ABduction. PT recommended and pt agreeable--ordered. Tylenol [id number] mg q6h prn. Given improvement the last few days and no tenderness anywhere today I don't recommend subacrom steroid injection.   No x-rays at this time.  2) Recurrent muscle fatigue; need to discuss more in-depth at future visit.  Low susp of acute electrolyte abnormality as cause, low susp of hypothyroidism or anemia. For now, I'd like to  r/o low/soft bp as the cause.  Stop amlodipine. Cont lisin 5 qd and hctz 25 qd. Monitor bp and hr at home daily.  Likely will do basic lab workup +/- neuro referral if pt not signif improved at f/u in 2 wks.  An After Visit Summary was printed and given to the patient.  FOLLOW UP: Return in about 2 weeks (around 07/01/2020) for f/u muscle fatigue and BP's.  Signed:  Crissie Sickles, MD           06/17/2020

## 2020-06-17 NOTE — Patient Instructions (Signed)
Stop your amlodipine. Check blood pressure and heart rate once daily and write numbers down for review with me in about 2 wks.

## 2020-06-20 DIAGNOSIS — M7541 Impingement syndrome of right shoulder: Secondary | ICD-10-CM | POA: Diagnosis not present

## 2020-06-20 DIAGNOSIS — M25511 Pain in right shoulder: Secondary | ICD-10-CM | POA: Diagnosis not present

## 2020-06-22 ENCOUNTER — Telehealth: Payer: Self-pay

## 2020-06-22 NOTE — Telephone Encounter (Signed)
Received PT initial evaluation for patient, plan of care/recommendations: 2x/week for the next 4 weeks.  Placed on PCP desk to review and sign, if appropriate.

## 2020-06-22 NOTE — Telephone Encounter (Signed)
Form faxed to [facility], placed in box for scan

## 2020-06-22 NOTE — Telephone Encounter (Signed)
Signed and put in box to go up front. Signed:  Crissie Sickles, MD           06/22/2020

## 2020-06-23 DIAGNOSIS — M7541 Impingement syndrome of right shoulder: Secondary | ICD-10-CM | POA: Diagnosis not present

## 2020-06-23 DIAGNOSIS — M25511 Pain in right shoulder: Secondary | ICD-10-CM | POA: Diagnosis not present

## 2020-06-26 DIAGNOSIS — M7541 Impingement syndrome of right shoulder: Secondary | ICD-10-CM | POA: Diagnosis not present

## 2020-06-26 DIAGNOSIS — M25511 Pain in right shoulder: Secondary | ICD-10-CM | POA: Diagnosis not present

## 2020-06-30 DIAGNOSIS — M7541 Impingement syndrome of right shoulder: Secondary | ICD-10-CM | POA: Diagnosis not present

## 2020-06-30 DIAGNOSIS — M25511 Pain in right shoulder: Secondary | ICD-10-CM | POA: Diagnosis not present

## 2020-07-02 ENCOUNTER — Encounter: Payer: Self-pay | Admitting: Family Medicine

## 2020-07-02 ENCOUNTER — Ambulatory Visit (INDEPENDENT_AMBULATORY_CARE_PROVIDER_SITE_OTHER): Payer: Medicare Other | Admitting: Family Medicine

## 2020-07-02 ENCOUNTER — Other Ambulatory Visit: Payer: Self-pay

## 2020-07-02 VITALS — BP 118/75 | HR 74 | Temp 97.8°F | Resp 16 | Ht 59.0 in | Wt 134.4 lb

## 2020-07-02 DIAGNOSIS — M6289 Other specified disorders of muscle: Secondary | ICD-10-CM

## 2020-07-02 DIAGNOSIS — M25511 Pain in right shoulder: Secondary | ICD-10-CM | POA: Diagnosis not present

## 2020-07-02 DIAGNOSIS — M7541 Impingement syndrome of right shoulder: Secondary | ICD-10-CM | POA: Diagnosis not present

## 2020-07-02 NOTE — Progress Notes (Signed)
OFFICE VISIT  07/02/2020  CC:  Chief Complaint  Patient presents with  . Follow-up    Muscle fatigue, blood pressure. Feeling improvement    HPI:    Patient is a 75 y.o. [name] who presents for 2 wk f/u right shoulder pain and recurrent generalized muscle fatigue. A/P as of last visit: "1) Right RC impingement syndrome: quite limited in ABduction. PT recommended and pt agreeable--ordered. Tylenol [phone] mg q6h prn. Given improvement the last few days and no tenderness anywhere today I don't recommend subacrom steroid injection.   No x-rays at this time.  2) Recurrent muscle fatigue; need to discuss more in-depth at future visit.  Low susp of acute electrolyte abnormality as cause, low susp of hypothyroidism or anemia. For now, I'd like to r/o low/soft bp as the cause.  Stop amlodipine. Cont lisin 5 qd and hctz 25 qd. Monitor bp and hr at home daily.  Likely will do basic lab workup +/- neuro referral if pt not signif improved at f/u in 2 wks."  INTERIM HX: Right shoulder doing much better, has had 3 PT sessions and is very happy.  "Weakness" in muscles feels better overall since last visit. For instance, pulling sheets off bed this morning made her feel like muscles were "being used".   After done she doesn't feel tired, though.   She has a very hard time describing the sensation.  Some days notices it and some days not at all. This problem occurred years ago, neurol saw her and said it was a bp med side effect but turned out not to be, it spont resolved and then has re-emerged in the last 6 mo or so. Denies feeling any weakness or effort issues with talking/speech, swallowing, chewing, or breathing. No focal limb weakness.  No headaches, no dizziness, no paresthesias, no tremors.  No balance problems or falls.  No memory/cognition changes.   Past Medical History:  Diagnosis Date  . Angioedema 01/2018   Autoimmune urticaria/angioedema-->needs chronic nonsedating  antihistamine and an H2 blocker indefinitely.  1 episode ->ASA and ARB d/c'd as precaution.  Then had another episode a couple weeks later.  Another episode just prior to seen allergist--allergist did blood workup/allergy testing->dx of autoimmune urticaria.  . Beta thalassemia, heterozygous    vs alpha thalassemia minor (pt's family originally from [place]).  [place] CAP (community acquired pneumonia) 02/2010  . Colon cancer screening    Pt declines colonoscopy.  Cologuard negative 01/2018-->repeat 3 yrs.  . Congenital sensorineural hearing loss    deaf in right ear; mild presbycusis in left ear (audiologist 2014)  . Depression   . HTN (hypertension)   . Hyperkalemia 07/17/2017   Decreased ARB dose, added low dose hctz, low K diet---repeat K normal.  . Hyperlipidemia, mixed    Refuses statins  . Hypothyroidism   . Osteopenia    DEXAs 2003-2013--pt has declined bisphosphonates  . Somnolence, daytime, controlled 04/22/2012  . Type II or unspecified type diabetes mellitus without mention of complication, uncontrolled   . Vitamin D deficiency     Past Surgical History:  Procedure Laterality Date  . BREAST BIOPSY     Left breast biopsy x 2 (fibrocystic/benign)  . COLONOSCOPY  2008   Normal.  10 yr recall (initial colonoscopy done in [place]--needs referral to local GI in 2018.  . TONSILLECTOMY AND ADENOIDECTOMY     as a child    Outpatient Medications Prior to Visit  Medication Sig Dispense Refill  . Calcium Carbonate-Vitamin D 600-400  MG-UNIT tablet Take 1 tablet by mouth 2 (two) times daily.    . [employer] Lancets Ultra-Thin 30G MISC USE TO TEST BLOOD SUGAR ONCE DAILY DX11.9 100 each 2  . glucose blood (ONETOUCH ULTRA) test strip TEST 1 EACH BY OTHER ROUTE DAILY E11.9 100 strip 2  . hydrochlorothiazide (HYDRODIURIL) 25 MG tablet TAKE 1 TABLET DAILY 90 tablet 1  . levothyroxine (SYNTHROID) 88 MCG tablet Take 1 tablet (88 mcg total) by mouth daily. 90 tablet 3  . lisinopril (ZESTRIL) 5 MG  tablet TAKE 1 TABLET DAILY 90 tablet 1  . metFORMIN (GLUCOPHAGE) 500 MG tablet TAKE 1 TABLET TWICE A DAY 180 tablet 1  . Omega-3 Fatty Acids (FISH OIL) 1000 MG CAPS Take 1 capsule by mouth 2 (two) times daily.    . fexofenadine (ALLEGRA) 180 MG tablet Take 1 tablet (180 mg total) by mouth daily. (Patient not taking: No sig reported) 90 tablet 3  . hyoscyamine (LEVSIN, ANASPAZ) 0.125 MG tablet 1-2 tabs q4h prn (Patient not taking: No sig reported) 30 tablet 2  . EPINEPHrine 0.3 mg/0.3 mL IJ SOAJ injection INJECT 0.3 MLS (0.3 MG TOTAL) INTO THE MUSCLE ONCE FOR 1 DOSE. (Patient not taking: No sig reported)  1   No facility-administered medications prior to visit.    No Known Allergies  ROS As per HPI  PE: Vitals with BMI 07/02/2020 06/17/2020 04/30/2020  Height 4\' 11"  4\' 11"  4\' 11"   Weight 134 lbs 6 oz 135 lbs 10 oz 134 lbs  BMI 27.13 97.67 34.19  Systolic 379 024 097  Diastolic 75 65 70  Pulse 74 80 73    Gen: Alert, well appearing.  Patient is oriented to person, place, time, and situation. AFFECT: pleasant, lucid thought and speech. CV: RRR, no m/r/g.   LUNGS: CTA bilat, nonlabored resps, good aeration in all lung fields. Neuro: CN 2-12 intact bilaterally, strength 5/5 in proximal and distal upper extremities and lower extremities bilaterally.  No sensory deficits.  No tremor.  No disdiadochokinesis.  No ataxia.  Upper extremity and lower extremity DTRs symmetric.  No pronator drift.   LABS:    Chemistry      Component Value Date/Time   NA 138 04/30/2020 1049   NA 139 02/27/2018 1314   K 3.9 04/30/2020 1049   CL 99 04/30/2020 1049   CO2 32 04/30/2020 1049   BUN 19 04/30/2020 1049   BUN 19 02/27/2018 1314   CREATININE 0.78 04/30/2020 1049   CREATININE 0.77 05/29/2018 1028      Component Value Date/Time   CALCIUM 9.8 04/30/2020 1049   ALKPHOS 58 04/30/2020 1049   AST 16 04/30/2020 1049   ALT 16 04/30/2020 1049   BILITOT 0.3 04/30/2020 1049   BILITOT <0.2 02/27/2018 1314      Lab Results  Component Value Date   WBC 6.8 02/27/2018   HGB 12.5 02/27/2018   HCT 39.2 02/27/2018   MCV 63 (L) 02/27/2018   PLT 250.0 07/17/2017   Lab Results  Component Value Date   TSH 1.25 04/30/2020   Lab Results  Component Value Date   CHOL 234 (H) 04/30/2020   HDL 51.20 04/30/2020   LDLCALC 150 (H) 04/30/2020   LDLDIRECT 126.0 01/06/2016   TRIG 161.0 (H) 04/30/2020   CHOLHDL 5 04/30/2020     IMPRESSION AND PLAN:  1) R RC impingement syndrome: improving with physical therapy.  2) Muscle fatigue, vague/subtle.  Unknown etiology.  Not fitting well with MG. Hx of similar thing years  ago, spont resolved.  Improved the last couple weeks. No red flags for ominous neuromuscular pathology. We had a long conversation about this again today and decided to monitor for now but she can call or return at any time if changes her mind, next step would be neurologist evaluation.  An After Visit Summary was printed and given to the patient.  FOLLOW UP: Return for keep appt set for 83/22 for routine follow up.  Signed:  Crissie Sickles, MD           07/02/2020

## 2020-07-06 DIAGNOSIS — M25511 Pain in right shoulder: Secondary | ICD-10-CM | POA: Diagnosis not present

## 2020-07-06 DIAGNOSIS — M7541 Impingement syndrome of right shoulder: Secondary | ICD-10-CM | POA: Diagnosis not present

## 2020-07-09 DIAGNOSIS — M7541 Impingement syndrome of right shoulder: Secondary | ICD-10-CM | POA: Diagnosis not present

## 2020-07-09 DIAGNOSIS — M25511 Pain in right shoulder: Secondary | ICD-10-CM | POA: Diagnosis not present

## 2020-07-20 DIAGNOSIS — M25511 Pain in right shoulder: Secondary | ICD-10-CM | POA: Diagnosis not present

## 2020-07-20 DIAGNOSIS — M7541 Impingement syndrome of right shoulder: Secondary | ICD-10-CM | POA: Diagnosis not present

## 2020-07-23 DIAGNOSIS — M25511 Pain in right shoulder: Secondary | ICD-10-CM | POA: Diagnosis not present

## 2020-07-23 DIAGNOSIS — M7541 Impingement syndrome of right shoulder: Secondary | ICD-10-CM | POA: Diagnosis not present

## 2020-07-27 DIAGNOSIS — M25511 Pain in right shoulder: Secondary | ICD-10-CM | POA: Diagnosis not present

## 2020-07-27 DIAGNOSIS — M7541 Impingement syndrome of right shoulder: Secondary | ICD-10-CM | POA: Diagnosis not present

## 2020-08-02 ENCOUNTER — Other Ambulatory Visit: Payer: Self-pay | Admitting: Family Medicine

## 2020-08-02 DIAGNOSIS — E119 Type 2 diabetes mellitus without complications: Secondary | ICD-10-CM

## 2020-08-03 DIAGNOSIS — M25511 Pain in right shoulder: Secondary | ICD-10-CM | POA: Diagnosis not present

## 2020-08-03 DIAGNOSIS — M7541 Impingement syndrome of right shoulder: Secondary | ICD-10-CM | POA: Diagnosis not present

## 2020-08-06 DIAGNOSIS — M7541 Impingement syndrome of right shoulder: Secondary | ICD-10-CM | POA: Diagnosis not present

## 2020-08-06 DIAGNOSIS — M25511 Pain in right shoulder: Secondary | ICD-10-CM | POA: Diagnosis not present

## 2020-08-10 DIAGNOSIS — M7541 Impingement syndrome of right shoulder: Secondary | ICD-10-CM | POA: Diagnosis not present

## 2020-08-10 DIAGNOSIS — M25511 Pain in right shoulder: Secondary | ICD-10-CM | POA: Diagnosis not present

## 2020-08-13 DIAGNOSIS — M25511 Pain in right shoulder: Secondary | ICD-10-CM | POA: Diagnosis not present

## 2020-08-13 DIAGNOSIS — M7541 Impingement syndrome of right shoulder: Secondary | ICD-10-CM | POA: Diagnosis not present

## 2020-08-17 DIAGNOSIS — M25511 Pain in right shoulder: Secondary | ICD-10-CM | POA: Diagnosis not present

## 2020-08-17 DIAGNOSIS — M7541 Impingement syndrome of right shoulder: Secondary | ICD-10-CM | POA: Diagnosis not present

## 2020-08-20 ENCOUNTER — Telehealth: Payer: Self-pay

## 2020-08-20 DIAGNOSIS — M25511 Pain in right shoulder: Secondary | ICD-10-CM | POA: Diagnosis not present

## 2020-08-20 DIAGNOSIS — M7541 Impingement syndrome of right shoulder: Secondary | ICD-10-CM | POA: Diagnosis not present

## 2020-08-20 NOTE — Telephone Encounter (Signed)
Received PT re-evaluation for patient from [place] on PCP desk to review and sign, if appropriate.

## 2020-08-20 NOTE — Telephone Encounter (Signed)
Signed and put in box to go up front. Signed:  Crissie Sickles, MD           08/20/2020

## 2020-08-26 DIAGNOSIS — M7541 Impingement syndrome of right shoulder: Secondary | ICD-10-CM | POA: Diagnosis not present

## 2020-08-26 DIAGNOSIS — M25511 Pain in right shoulder: Secondary | ICD-10-CM | POA: Diagnosis not present

## 2020-08-31 DIAGNOSIS — M7541 Impingement syndrome of right shoulder: Secondary | ICD-10-CM | POA: Diagnosis not present

## 2020-08-31 DIAGNOSIS — M25511 Pain in right shoulder: Secondary | ICD-10-CM | POA: Diagnosis not present

## 2020-09-10 ENCOUNTER — Other Ambulatory Visit: Payer: Self-pay | Admitting: Family Medicine

## 2020-09-16 DIAGNOSIS — L814 Other melanin hyperpigmentation: Secondary | ICD-10-CM | POA: Diagnosis not present

## 2020-09-16 DIAGNOSIS — L570 Actinic keratosis: Secondary | ICD-10-CM | POA: Diagnosis not present

## 2020-10-05 ENCOUNTER — Ambulatory Visit (INDEPENDENT_AMBULATORY_CARE_PROVIDER_SITE_OTHER): Payer: Medicare Other | Admitting: Family Medicine

## 2020-10-05 ENCOUNTER — Encounter: Payer: Self-pay | Admitting: Family Medicine

## 2020-10-05 ENCOUNTER — Other Ambulatory Visit: Payer: Self-pay

## 2020-10-05 VITALS — BP 131/67 | HR 71 | Temp 97.8°F | Wt 134.0 lb

## 2020-10-05 DIAGNOSIS — L2489 Irritant contact dermatitis due to other agents: Secondary | ICD-10-CM

## 2020-10-05 MED ORDER — PREDNISONE 20 MG PO TABS: ORAL_TABLET | ORAL | 0 refills | Status: DC

## 2020-10-05 MED ORDER — METHYLPREDNISOLONE ACETATE 80 MG/ML IJ SUSP
80.0000 mg | Freq: Once | INTRAMUSCULAR | Status: AC
  Administered 2020-10-05: 80 mg via INTRAMUSCULAR

## 2020-10-05 NOTE — Progress Notes (Signed)
This visit occurred during the SARS-CoV-2 public health emergency.  Safety protocols were in place, including screening questions prior to the visit, additional usage of staff PPE, and extensive cleaning of exam room while observing appropriate contact time as indicated for disinfecting solutions.    Mackenzie Wood , Jul 27, 1945, 75 y.o., female MRN: [id number] Patient Care Team    Relationship Specialty Notifications Start End  McGowen, Adrian Blackwater, MD PCP - General Family Medicine  07/02/12    Comment: Dimas Millin, MD Consulting Physician Otolaryngology  06/19/12   Bobbitt, Sedalia Muta, MD (Inactive) Consulting Physician Allergy and Immunology  03/07/18     Chief Complaint  Patient presents with   Facial Swelling    Pt c/o b/l eye swelling x 2 days after working in the garden; pt has developed blisters L hand and L side of face x 1 day       Subjective: Pt presents for an OV with complaints of rash and eye swelling of 3 days duration. She reports the rash on her thumb presented 3 days and then on Saturday her eyes were swollen shut. She has been in the garden all last week. She has a h/o of plant dermatitis, food allergy and hives. She has been placing cold compresses on her eyes. She denies mouth or throat involvement . She denies shortness of breath.   Depression screen [facility] 2/9 02/05/2020 05/29/2018 12/05/2017 01/13/2017 01/08/2016  Decreased Interest 0 0 0 - 0  Down, Depressed, Hopeless 2 0 0 - 0  PHQ - 2 Score 2 0 0 - 0  Altered sleeping 0 0 - - -  Tired, decreased energy 0 1 - - -  Change in appetite 0 1 - - -  Feeling bad or failure about yourself  0 0 - 0 -  Trouble concentrating 0 0 - 0 -  Moving slowly or fidgety/restless 0 0 - 0 -  Suicidal thoughts 0 0 - - -  PHQ-9 Score 2 2 - - -  Difficult doing work/chores Somewhat difficult Not difficult at all - - -    No Known Allergies Social History   Social History Narrative   Married, 2 daughters, 3  grandchildren.   Retired Archivist.  Relocated from [place] around 2010.   No T/A/Ds.  Exercise: sedentary life + no formal exercise at all.   Diet: regular            Past Medical History:  Diagnosis Date   Angioedema 01/2018   Autoimmune urticaria/angioedema-->needs chronic nonsedating antihistamine and an H2 blocker indefinitely.  1 episode ->ASA and ARB d/c'd as precaution.  Then had another episode a couple weeks later.  Another episode just prior to seen allergist--allergist did blood workup/allergy testing->dx of autoimmune urticaria.   Beta thalassemia, heterozygous    vs alpha thalassemia minor (pt's family originally from [place]).   CAP (community acquired pneumonia) 02/2010   Colon cancer screening    Pt declines colonoscopy.  Cologuard negative 01/2018-->repeat 3 yrs.   Congenital sensorineural hearing loss    deaf in right ear; mild presbycusis in left ear (audiologist 2014)   Depression    HTN (hypertension)    Hyperkalemia 07/17/2017   Decreased ARB dose, added low dose hctz, low K diet---repeat K normal.   Hyperlipidemia, mixed    Refuses statins   Hypothyroidism    Osteopenia    DEXAs 2003-2013--pt has declined bisphosphonates   Somnolence, daytime, controlled 04/22/2012   Type II or  unspecified type diabetes mellitus without mention of complication, uncontrolled    Vitamin D deficiency    Past Surgical History:  Procedure Laterality Date   BREAST BIOPSY     Left breast biopsy x 2 (fibrocystic/benign)   COLONOSCOPY  2008   Normal.  10 yr recall (initial colonoscopy done in [place]--needs referral to local GI in 2018.   TONSILLECTOMY AND ADENOIDECTOMY     as a child   Family History  Problem Relation Age of Onset   Hypertension Mother    Hypertension Father    Allergies as of 10/05/2020   No Known Allergies      Medication List        Accurate as of October 05, 2020 12:10 PM. If you have any questions, ask your nurse or doctor.           Calcium Carbonate-Vitamin D 600-400 MG-UNIT tablet Take 1 tablet by mouth 2 (two) times daily.   [employer]S Lancets Ultra-Thin 30G Misc USE TO TEST BLOOD SUGAR ONCE DAILY DX11.9   fexofenadine 180 MG tablet Commonly known as: ALLEGRA Take 1 tablet (180 mg total) by mouth daily.   Fish Oil 1000 MG Caps Take 1 capsule by mouth 2 (two) times daily.   hydrochlorothiazide 25 MG tablet Commonly known as: HYDRODIURIL TAKE 1 TABLET DAILY   hyoscyamine 0.125 MG tablet Commonly known as: LEVSIN 1-2 tabs q4h prn   lisinopril 5 MG tablet Commonly known as: ZESTRIL TAKE 1 TABLET DAILY   metFORMIN 500 MG tablet Commonly known as: GLUCOPHAGE TAKE 1 TABLET TWICE A DAY   OneTouch Ultra test strip Generic drug: glucose blood TEST 1 EACH BY OTHER ROUTE DAILY E11.9   predniSONE 20 MG tablet Commonly known as: DELTASONE 60 mg x3d, 40 mg x3d, 20 mg x2d, 10 mg x2d Started by: Howard Pouch, DO   Synthroid 88 MCG tablet Generic drug: levothyroxine TAKE 1 TABLET DAILY        All past medical history, surgical history, allergies, family history, immunizations andmedications were updated in the EMR today and reviewed under the history and medication portions of their EMR.     ROS: Negative, with the exception of above mentioned in HPI   Objective:  BP 131/67   Pulse 71   Temp 97.8 F (36.6 C) (Oral)   Wt 134 lb (60.8 kg)   SpO2 97%   BMI 27.06 kg/m  Body mass index is 27.06 kg/m. Gen: Afebrile. No acute distress. Nontoxic in appearance, well developed, well nourished.  HENT: AT. [place]. MMM, no oral lesions. Throat without erythema or exudates. No cough or lip swelling., swelling Eyes:Pupils Equal Round Reactive to light, Extraocular movements intact,  Conjunctiva without redness, discharge or icterus. CV: RRR  Chest: CTAB, no wheeze or crackles. Good air movement, normal resp effort.  Skin: erythema base with blisters left thumb.  purpura or petechiae.  Neuro: Normal gait. PERLA.  EOMi. Alert. Oriented x3   No results found. No results found. No results found for this or any previous visit (from the past 24 hour(s)).  Assessment/Plan: Mackenzie Wood is a 75 y.o. female present for OV for  Irritant contact dermatitis due to other agents- plant Likely plant dermatitis  Xyzal QHS Cool compresses on eyes.  Discussed prevention.  IM depo medrol.  Benadryl gel for comfort OTC Prednisone taper prescribed.  - methylPREDNISolone acetate (DEPO-MEDROL) injection 80 mg F/u prn  Reviewed expectations re: course of current medical issues. Discussed self-management of symptoms. Outlined signs and symptoms  indicating need for more acute intervention. Patient verbalized understanding and all questions were answered. Patient received an After-Visit Summary.    No orders of the defined types were placed in this encounter.  Meds ordered this encounter  Medications   predniSONE (DELTASONE) 20 MG tablet    Sig: 60 mg x3d, 40 mg x3d, 20 mg x2d, 10 mg x2d    Dispense:  18 tablet    Refill:  0   methylPREDNISolone acetate (DEPO-MEDROL) injection 80 mg    Referral Orders  No referral(s) requested today     Note is dictated utilizing voice recognition software. Although note has been proof read prior to signing, occasional typographical errors still can be missed. If any questions arise, please do not hesitate to call for verification.   electronically signed by:  Howard Pouch, DO  [place]

## 2020-10-05 NOTE — Patient Instructions (Signed)
Xyzal before bed can help with hives, itching  and allergies.   Fels Naptha after exposure.   Benadryl gel for itching.   Cool compresses on eyes.

## 2020-10-07 ENCOUNTER — Telehealth: Payer: Self-pay

## 2020-10-07 NOTE — Telephone Encounter (Signed)
FYI, just to keep you in the loop.  (I called pt and discussed sx's. Low suspicion of contact derm. Suspect this is somewhere on the spectrum of her known idiopathic urticaria/angioedema (although she denies hives with this latest episode, just a few papulovesicular lesions on hands per her description). Bilat swelling of eyelids/peri-orbital regions that has been migrating/settling some in cheeks/jaw region bilat now. Sounds like last 24h things improving some.  Never had tongue or lips or throat swelling or wheezing or sob. Unclear if related to lisinopril (started this 05/2018) but will go ahead and have her d/c this med now. She'll monitor bps and keep previously arranged f/u with me next month. Finish prednisone as rx'd.) Signed:  Crissie Sickles, MD           10/07/2020

## 2020-10-07 NOTE — Telephone Encounter (Signed)
Pt saw Dr.Kuneff on 7/11, given depo medrol injection in office and prednisone 20mg  taper rx. Advised to try Xyzal as well but she has been taking allegra since part of daily regimen already along with doing cool compress, benadryl gel for itching. Had no reaction to injection but swelling radiating to cheek and lip area. No redness noted that she could see but flushed with rash due to plant dermatitis mentioned during OV. Unsure if she needs to continue taking prednisone.   Please Advise.

## 2020-10-07 NOTE — Telephone Encounter (Signed)
Yes, continue prednisone. As long as no shortness of breath or wheezing or swelling of tongue then nothing new recommended at this time.

## 2020-10-07 NOTE — Telephone Encounter (Signed)
Patient was seen on Monday by Dr. Raoul Pitch.  Her face, cheek area and lips are swelling now.  Is this to be expected?  Please advise  [phone]

## 2020-10-07 NOTE — Telephone Encounter (Signed)
Spoke with patient regarding recommendations.

## 2020-10-08 NOTE — Telephone Encounter (Signed)
Noted  

## 2020-10-17 DIAGNOSIS — L2489 Irritant contact dermatitis due to other agents: Secondary | ICD-10-CM | POA: Diagnosis not present

## 2020-10-27 ENCOUNTER — Other Ambulatory Visit: Payer: Self-pay

## 2020-10-28 ENCOUNTER — Ambulatory Visit (INDEPENDENT_AMBULATORY_CARE_PROVIDER_SITE_OTHER): Payer: Medicare Other | Admitting: Family Medicine

## 2020-10-28 ENCOUNTER — Encounter: Payer: Self-pay | Admitting: Family Medicine

## 2020-10-28 ENCOUNTER — Other Ambulatory Visit: Payer: Self-pay

## 2020-10-28 VITALS — BP 144/78 | HR 71 | Temp 98.0°F | Resp 16 | Ht 59.0 in | Wt 136.2 lb

## 2020-10-28 DIAGNOSIS — E039 Hypothyroidism, unspecified: Secondary | ICD-10-CM | POA: Diagnosis not present

## 2020-10-28 DIAGNOSIS — E119 Type 2 diabetes mellitus without complications: Secondary | ICD-10-CM

## 2020-10-28 DIAGNOSIS — T783XXD Angioneurotic edema, subsequent encounter: Secondary | ICD-10-CM

## 2020-10-28 DIAGNOSIS — L501 Idiopathic urticaria: Secondary | ICD-10-CM

## 2020-10-28 DIAGNOSIS — I10 Essential (primary) hypertension: Secondary | ICD-10-CM | POA: Diagnosis not present

## 2020-10-28 LAB — CBC WITH DIFFERENTIAL/PLATELET
Basophils Absolute: 0.1 10*3/uL (ref 0.0–0.1)
Basophils Relative: 1 % (ref 0.0–3.0)
Eosinophils Absolute: 0.3 10*3/uL (ref 0.0–0.7)
Eosinophils Relative: 4 % (ref 0.0–5.0)
HCT: 37.8 % (ref 36.0–46.0)
Hemoglobin: 11.9 g/dL — ABNORMAL LOW (ref 12.0–15.0)
Lymphocytes Relative: 27.9 % (ref 12.0–46.0)
Lymphs Abs: 1.7 10*3/uL (ref 0.7–4.0)
MCHC: 31.4 g/dL (ref 30.0–36.0)
MCV: 65.8 fl — ABNORMAL LOW (ref 78.0–100.0)
Monocytes Absolute: 0.4 10*3/uL (ref 0.1–1.0)
Monocytes Relative: 7.1 % (ref 3.0–12.0)
Neutro Abs: 3.7 10*3/uL (ref 1.4–7.7)
Neutrophils Relative %: 60 % (ref 43.0–77.0)
Platelets: 240 10*3/uL (ref 150.0–400.0)
RBC: 5.74 Mil/uL — ABNORMAL HIGH (ref 3.87–5.11)
RDW: 15.4 % (ref 11.5–15.5)
WBC: 6.2 10*3/uL (ref 4.0–10.5)

## 2020-10-28 LAB — BASIC METABOLIC PANEL
BUN: 17 mg/dL (ref 6–23)
CO2: 29 mEq/L (ref 19–32)
Calcium: 9.4 mg/dL (ref 8.4–10.5)
Chloride: 99 mEq/L (ref 96–112)
Creatinine, Ser: 0.83 mg/dL (ref 0.40–1.20)
GFR: 68.06 mL/min (ref >60.00)
Glucose, Bld: 97 mg/dL (ref 70–99)
Potassium: 4.2 mEq/L (ref 3.5–5.1)
Sodium: 139 mEq/L (ref 135–145)

## 2020-10-28 LAB — HEMOGLOBIN A1C: Hgb A1c MFr Bld: 7.4 % — ABNORMAL HIGH (ref 4.6–6.5)

## 2020-10-28 LAB — TSH: TSH: 3.14 u[IU]/mL (ref 0.35–5.50)

## 2020-10-28 MED ORDER — FEXOFENADINE HCL 180 MG PO TABS: 180.0000 mg | ORAL_TABLET | Freq: Every day | ORAL | 3 refills | Status: DC

## 2020-10-28 MED ORDER — AMLODIPINE BESYLATE 10 MG PO TABS: 10.0000 mg | ORAL_TABLET | Freq: Every day | ORAL | 1 refills | Status: DC

## 2020-10-28 NOTE — Progress Notes (Signed)
OFFICE VISIT  10/28/2020  CC:  Chief Complaint  Patient presents with   Follow-up    RCI, pt is not fasting   HPI:    Patient is a 75 y.o. [name] who presents for 6 mo f/u DM 2, HTN, hypothyroidism.  Since last visit she had some issue with facial swelling, unclear whether contact derm. I stopped her lisinopril as a precaution.  She ended up going to UC and was rx'd a cream. Says J&J shampoo helped a lot.    I had already stopped her amlodipine 4 mo ago d/t soft bps and question of whether this may be causing generalized fatigue. Her bp's are up consistently over 150s avg syst, diast staying <80.  Itchy rash and facial swelling about 1 mo ago, was seen here and prednisone rx'd--?irritant contact derm?.   Skin rash resolved, just some splotchy discoloration gradually fading. No more facial swelling.  No itching.  Shows me pictures today of diffuse hives and MARKED facial swelling and periorbital swelling.  She never had tongue swelling or difficulty swallowing or breathing.  Home glucoses avg 125.  ROS as above, plus--> no fevers, no CP, no SOB, no wheezing, no cough, no dizziness, no HAs, no melena/hematochezia.  No polyuria or polydipsia.  No myalgias or arthralgias.  No focal weakness, paresthesias, or tremors.  No acute vision or hearing abnormalities.  No dysuria or unusual/new urinary urgency or frequency.  No recent changes in lower legs. No n/v/d or abd pain.  No palpitations.      Past Medical History:  Diagnosis Date   Angioedema 01/2018   Autoimmune urticaria/angioedema-->needs chronic nonsedating antihistamine and an H2 blocker indefinitely.  1 episode ->ASA and ARB d/c'd as precaution.  Then had another episode a couple weeks later.  Another episode just prior to seen allergist--allergist did blood workup/allergy testing->dx of autoimmune urticaria.   Beta thalassemia, heterozygous    vs alpha thalassemia minor (pt's family originally from [place]).   CAP  (community acquired pneumonia) 02/2010   Colon cancer screening    Pt declines colonoscopy.  Cologuard negative 01/2018-->repeat 3 yrs.   Congenital sensorineural hearing loss    deaf in right ear; mild presbycusis in left ear (audiologist 2014)   Depression    HTN (hypertension)    Hyperkalemia 07/17/2017   Decreased ARB dose, added low dose hctz, low K diet---repeat K normal.   Hyperlipidemia, mixed    Refuses statins   Hypothyroidism    Osteopenia    DEXAs 2003-2013--pt has declined bisphosphonates   Somnolence, daytime, controlled 04/22/2012   Type II or unspecified type diabetes mellitus without mention of complication, uncontrolled    Vitamin D deficiency     Past Surgical History:  Procedure Laterality Date   BREAST BIOPSY     Left breast biopsy x 2 (fibrocystic/benign)   COLONOSCOPY  2008   Normal.  10 yr recall (initial colonoscopy done in [place]--needs referral to local GI in 2018.   TONSILLECTOMY AND ADENOIDECTOMY     as a child    Outpatient Medications Prior to Visit  Medication Sig Dispense Refill   Calcium Carbonate-Vitamin D 600-400 MG-UNIT tablet Take 1 tablet by mouth 2 (two) times daily.     clobetasol ointment (TEMOVATE) [id number] % Apply 1 application topically 2 (two) times daily.     [employer]S Lancets Ultra-Thin 30G MISC USE TO TEST BLOOD SUGAR ONCE DAILY DX11.9 100 each 2   fluorouracil (EFUDEX) 5 % cream 2 (two) times daily.  glucose blood (ONETOUCH ULTRA) test strip TEST 1 EACH BY OTHER ROUTE DAILY E11.9 100 strip 2   hydrochlorothiazide (HYDRODIURIL) 25 MG tablet TAKE 1 TABLET DAILY 90 tablet 1   hyoscyamine (LEVSIN, ANASPAZ) 0.125 MG tablet 1-2 tabs q4h prn 30 tablet 2   metFORMIN (GLUCOPHAGE) 500 MG tablet TAKE 1 TABLET TWICE A DAY 180 tablet 1   Omega-3 Fatty Acids (FISH OIL) 1000 MG CAPS Take 1 capsule by mouth 2 (two) times daily.     SYNTHROID 88 MCG tablet TAKE 1 TABLET DAILY 90 tablet 1   fexofenadine (ALLEGRA) 180 MG tablet Take 1 tablet (180  mg total) by mouth daily. 90 tablet 3   predniSONE (DELTASONE) 20 MG tablet 60 mg x3d, 40 mg x3d, 20 mg x2d, 10 mg x2d (Patient not taking: Reported on 10/28/2020) 18 tablet 0   No facility-administered medications prior to visit.    Allergies  Allergen Reactions   Lisinopril Swelling    NOT CLEAR ASSOCIATION    ROS As per HPI  PE: Vitals with BMI 10/28/2020 10/28/2020 10/05/2020  Height - '4\' 11"'$  -  Weight - 136 lbs 3 oz 134 lbs  BMI - [id number] -  Systolic [id number]  Diastolic 78 78 67  Pulse - 71 71  Manual bp today is 144/78  Gen: Alert, well appearing.  Patient is oriented to person, place, time, and situation. AFFECT: pleasant, lucid thought and speech. Face: no swelling or erythema or rash.  Eyes w/out injection or lid swelling. CV: RRR, no m/r/g.   LUNGS: CTA bilat, nonlabored resps, good aeration in all lung fields. Skin: some splotches of very light pinkish/brownish hue scattered over legs, abd, arms-->+blanchable.  No hives or ecchymoses or petechiae.  No pustules or vesicles. EXT: no clubbing or cyanosis.  no edema.   LABS:  Lab Results  Component Value Date   TSH 1.25 04/30/2020   Lab Results  Component Value Date   WBC 6.8 02/27/2018   HGB 12.5 02/27/2018   HCT 39.2 02/27/2018   MCV 63 (L) 02/27/2018   PLT 250.0 07/17/2017   Lab Results  Component Value Date   CREATININE 0.78 04/30/2020   BUN 19 04/30/2020   NA 138 04/30/2020   K 3.9 04/30/2020   CL 99 04/30/2020   CO2 32 04/30/2020   Lab Results  Component Value Date   ALT 16 04/30/2020   AST 16 04/30/2020   ALKPHOS 58 04/30/2020   BILITOT 0.3 04/30/2020   Lab Results  Component Value Date   CHOL 234 (H) 04/30/2020   Lab Results  Component Value Date   HDL 51.20 04/30/2020   Lab Results  Component Value Date   LDLCALC 150 (H) 04/30/2020   Lab Results  Component Value Date   TRIG 161.0 (H) 04/30/2020   Lab Results  Component Value Date   CHOLHDL 5 04/30/2020   Lab Results   Component Value Date   HGBA1C 6.8 (H) 04/30/2020    IMPRESSION AND PLAN:  1) HTN: uncontrolled since off amlod and lisin. Restart amlodipine '10mg'$  and continue hctz 25 qd. Lytes/cr today.  2) DM 2 w/out complications. Good home glucoses.  Cont metformin 500 bid. Hba1c today.  3) Hypothyroidism: takes 88 mcg T4 correctly daily. TSH today.  4) Recent episode of idiopathic urticaria + addition of angioedema.   She had never had the angioedema with her hives before.  Prednisone and antihistamine didn't seem to help as well this time.  I think her ACE-I  did have something to do with this. She is off this med now and I'll avoid any ACE-I's or ARBs in future. Emphasized the need for her to take allegra [place].  5) Preventative health care: Vaccines UTD except shingrix-->will discuss next f/u. Cologuard due approx 01/2021. Mammogram due 01/2021.  An After Visit Summary was printed and given to the patient.  FOLLOW UP: Return for 3-4 wks f/u HTN.  Signed:  Crissie Sickles, MD           10/28/2020

## 2020-10-29 ENCOUNTER — Other Ambulatory Visit: Payer: Medicare Other

## 2020-10-29 DIAGNOSIS — D509 Iron deficiency anemia, unspecified: Secondary | ICD-10-CM | POA: Diagnosis not present

## 2020-11-03 LAB — HEMOGLOBINOPATHY EVALUATION
Fetal Hemoglobin Testing: <1 % (ref 0.0–1.9)
HCT: 37.5 % (ref 35.0–45.0)
Hemoglobin A2 - HGBRFX: 4.8 % — ABNORMAL HIGH (ref 2.2–3.2)
Hemoglobin: 11.7 g/dL (ref 11.7–15.5)
Hgb A: 95.2 % — ABNORMAL LOW (ref >96.0)
MCH: 20.3 pg — ABNORMAL LOW (ref 27.0–33.0)
MCV: 65.1 fL — ABNORMAL LOW (ref 80.0–100.0)
RBC: 5.76 10*6/uL — ABNORMAL HIGH (ref 3.80–5.10)
RDW: 17.1 % — ABNORMAL HIGH (ref 11.0–15.0)

## 2020-11-24 ENCOUNTER — Other Ambulatory Visit: Payer: Self-pay

## 2020-11-24 ENCOUNTER — Encounter: Payer: Self-pay | Admitting: Family Medicine

## 2020-11-24 ENCOUNTER — Ambulatory Visit (INDEPENDENT_AMBULATORY_CARE_PROVIDER_SITE_OTHER): Payer: Medicare Other | Admitting: Family Medicine

## 2020-11-24 DIAGNOSIS — E119 Type 2 diabetes mellitus without complications: Secondary | ICD-10-CM

## 2020-11-24 MED ORDER — CVS LANCETS ULTRA-THIN 30G MISC: 3 refills | Status: DC

## 2020-11-24 NOTE — Progress Notes (Signed)
OFFICE VISIT  11/24/2020  CC:  Chief Complaint  Patient presents with   Follow-up    Hypertension   HPI:    Patient is a 75 y.o. [name] who presents for 1 mo f/u uncontrolled HTN as well as for recurrent urticaria/angioedema. A/P as of last visit: "1) HTN: uncontrolled since off amlod and lisin. Restart amlodipine '10mg'$  and continue hctz 25 qd. Lytes/cr today.   2) DM 2 w/out complications. Good home glucoses.  Cont metformin 500 bid. Hba1c today.   3) Hypothyroidism: takes 88 mcg T4 correctly daily. TSH today.   4) Recent episode of idiopathic urticaria + addition of angioedema.   She had never had the angioedema with her hives before.  Prednisone and antihistamine didn't seem to help as well this time.  I think her ACE-I did have something to do with this. She is off this med now and I'll avoid any ACE-I's or ARBs in future. Emphasized the need for her to take allegra [place].   5) Preventative health care: Vaccines UTD except shingrix-->will discuss next f/u. Cologuard due approx 01/2021. Mammogram due 01/2021."  INTERIM HX: Feeling well.  No further prob with swelling or rash, taking allegra daily.  Home bp's: 140/75 avg when checked every 2-3d, HR 68-80.  Most home glucoses (fasting) around 120, range 110-140. Metformin 500 bid.  TSH normal last visit. Hba1c up to 7.4%, up from 6.8%. Hb11.9, MCV 66, Hb electrophoresis c/w beta thalassemia minor (trait).   Past Medical History:  Diagnosis Date   Angioedema 01/2018   Autoimmune urticaria/angioedema-->needs chronic nonsedating antihistamine and an H2 blocker indefinitely.  1 episode ->ASA and ARB d/c'd as precaution.  Then had another episode a couple weeks later.  Another episode just prior to seen allergist--allergist did blood workup/allergy testing->dx of autoimmune urticaria.   Beta thalassemia, heterozygous    vs alpha thalassemia minor (pt's family originally from [place]).   CAP (community  acquired pneumonia) 02/2010   Colon cancer screening    Pt declines colonoscopy.  Cologuard negative 01/2018-->repeat 3 yrs.   Congenital sensorineural hearing loss    deaf in right ear; mild presbycusis in left ear (audiologist 2014)   Depression    HTN (hypertension)    Hyperkalemia 07/17/2017   Decreased ARB dose, added low dose hctz, low K diet---repeat K normal.   Hyperlipidemia, mixed    Refuses statins   Hypothyroidism    Osteopenia    DEXAs 2003-2013--pt has declined bisphosphonates   Somnolence, daytime, controlled 04/22/2012   Type II or unspecified type diabetes mellitus without mention of complication, uncontrolled    Vitamin D deficiency     Past Surgical History:  Procedure Laterality Date   BREAST BIOPSY     Left breast biopsy x 2 (fibrocystic/benign)   COLONOSCOPY  2008   Normal.  10 yr recall (initial colonoscopy done in [place]--needs referral to local GI in 2018.   TONSILLECTOMY AND ADENOIDECTOMY     as a child    Outpatient Medications Prior to Visit  Medication Sig Dispense Refill   amLODipine (NORVASC) 10 MG tablet Take 1 tablet (10 mg total) by mouth daily. 30 tablet 1   Calcium Carbonate-Vitamin D 600-400 MG-UNIT tablet Take 1 tablet by mouth 2 (two) times daily.     fexofenadine (ALLEGRA) 180 MG tablet Take 1 tablet (180 mg total) by mouth daily. 90 tablet 3   glucose blood (ONETOUCH ULTRA) test strip TEST 1 EACH BY OTHER ROUTE DAILY E11.9 100 strip 2  hydrochlorothiazide (HYDRODIURIL) 25 MG tablet TAKE 1 TABLET DAILY 90 tablet 1   hyoscyamine (LEVSIN, ANASPAZ) 0.125 MG tablet 1-2 tabs q4h prn 30 tablet 2   metFORMIN (GLUCOPHAGE) 500 MG tablet TAKE 1 TABLET TWICE A DAY 180 tablet 1   Omega-3 Fatty Acids (FISH OIL) 1000 MG CAPS Take 1 capsule by mouth 2 (two) times daily.     SYNTHROID 88 MCG tablet TAKE 1 TABLET DAILY 90 tablet 1   [employer] Lancets Ultra-Thin 30G MISC USE TO TEST BLOOD SUGAR ONCE DAILY DX11.9 100 each 2   clobetasol ointment (TEMOVATE)  [id number] % Apply 1 application topically 2 (two) times daily. (Patient not taking: Reported on 11/24/2020)     fluorouracil (EFUDEX) 5 % cream 2 (two) times daily. (Patient not taking: Reported on 11/24/2020)     No facility-administered medications prior to visit.    Allergies  Allergen Reactions   Lisinopril Swelling    NOT CLEAR ASSOCIATION    ROS As per HPI  PE: Vitals with BMI 11/24/2020 10/28/2020 10/28/2020  Height '4\' 11"'$  - '4\' 11"'$   Weight 133 lbs 3 oz - 136 lbs 3 oz  BMI [id number] - [id number]  Systolic 123456 [id number] 0000000  Diastolic 70 78 78  Pulse 84 - 71    Gen: Alert, well appearing.  Patient is oriented to person, place, time, and situation. AFFECT: pleasant, lucid thought and speech. No further exam today.  LABS:  Lab Results  Component Value Date   TSH 3.14 10/28/2020   Lab Results  Component Value Date   WBC 6.2 10/28/2020   HGB 11.7 10/29/2020   HCT 37.5 10/29/2020   MCV 65.1 (L) 10/29/2020   PLT 240.0 10/28/2020   Lab Results  Component Value Date   IRON 137 05/24/2012   TIBC 375 05/24/2012   Lab Results  Component Value Date   CREATININE 0.83 10/28/2020   BUN 17 10/28/2020   NA 139 10/28/2020   K 4.2 10/28/2020   CL 99 10/28/2020   CO2 29 10/28/2020   Lab Results  Component Value Date   ALT 16 04/30/2020   AST 16 04/30/2020   ALKPHOS 58 04/30/2020   BILITOT 0.3 04/30/2020   Lab Results  Component Value Date   CHOL 234 (H) 04/30/2020   Lab Results  Component Value Date   HDL 51.20 04/30/2020   Lab Results  Component Value Date   LDLCALC 150 (H) 04/30/2020   Lab Results  Component Value Date   TRIG 161.0 (H) 04/30/2020   Lab Results  Component Value Date   CHOLHDL 5 04/30/2020   Lab Results  Component Value Date   HGBA1C 7.4 (H) 10/28/2020    IMPRESSION AND PLAN:  1) HTN, not ideal control but we decided to hold off on any med changes/additions today. Avoiding ARB/ACE. Could at BB or clonidine or alpha blocker in future if bp steadily  climbing. Lytes/cr normal 1 mo ago.  2) DM 2, not ideal control but acceptable a1c 1 mo ago. Cont metformin 500 bid for now. Recheck a1c 3 mo.  3) Hx angioedema and recurrent idiopathic urticaria: Resolved, continue allegra 180 qd.  4) Chronic microcytosis, borderline low Hb. 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 No new labs today. Reassured pt.  No treatment or specialist referral needed at this time.  5) Preventative health: she declines shingrix today. She'll get flu vaccine later this fall.  An After Visit Summary was printed and given to the  patient.  FOLLOW UP: Return in about 3 months (around 02/24/2021) for routine chronic illness f/u.  Signed:  Crissie Sickles, MD           11/24/2020

## 2020-11-26 ENCOUNTER — Other Ambulatory Visit: Payer: Self-pay | Admitting: Family Medicine

## 2020-12-16 DIAGNOSIS — H2513 Age-related nuclear cataract, bilateral: Secondary | ICD-10-CM | POA: Diagnosis not present

## 2020-12-16 DIAGNOSIS — E119 Type 2 diabetes mellitus without complications: Secondary | ICD-10-CM | POA: Diagnosis not present

## 2020-12-16 DIAGNOSIS — H35033 Hypertensive retinopathy, bilateral: Secondary | ICD-10-CM | POA: Diagnosis not present

## 2020-12-16 LAB — HM DIABETES EYE EXAM

## 2021-01-06 ENCOUNTER — Other Ambulatory Visit: Payer: Self-pay | Admitting: Family Medicine

## 2021-01-06 DIAGNOSIS — Z1231 Encounter for screening mammogram for malignant neoplasm of breast: Secondary | ICD-10-CM

## 2021-01-15 ENCOUNTER — Other Ambulatory Visit: Payer: Self-pay | Admitting: Family Medicine

## 2021-01-15 DIAGNOSIS — E119 Type 2 diabetes mellitus without complications: Secondary | ICD-10-CM

## 2021-02-05 ENCOUNTER — Ambulatory Visit
Admission: RE | Admit: 2021-02-05 | Discharge: 2021-02-05 | Disposition: A | Discharge status: Home / Self Care | Payer: Medicare Other | Source: Ambulatory Visit | Attending: Family Medicine | Admitting: Family Medicine

## 2021-02-05 ENCOUNTER — Other Ambulatory Visit: Payer: Self-pay

## 2021-02-05 DIAGNOSIS — Z1231 Encounter for screening mammogram for malignant neoplasm of breast: Secondary | ICD-10-CM

## 2021-02-10 ENCOUNTER — Other Ambulatory Visit: Payer: Self-pay

## 2021-02-10 ENCOUNTER — Ambulatory Visit (INDEPENDENT_AMBULATORY_CARE_PROVIDER_SITE_OTHER): Payer: Medicare Other

## 2021-02-10 DIAGNOSIS — Z0000 Encounter for general adult medical examination without abnormal findings: Secondary | ICD-10-CM | POA: Diagnosis not present

## 2021-02-10 NOTE — Progress Notes (Signed)
Virtual Visit via Telephone Note  I connected with  Mackenzie Wood on 02/10/21 at 11:45 AM EST by telephone and verified that I am speaking with the correct person using two identifiers.  Medicare Annual Wellness visit completed telephonically due to Covid-19 pandemic.   Persons participating in this call: This Health Coach and this patient.   Location: Patient: Home Provider: Office   I discussed the limitations, risks, security and privacy concerns of performing an evaluation and management service by telephone and the availability of in person appointments. The patient expressed understanding and agreed to proceed.  Unable to perform video visit due to video visit attempted and failed and/or patient does not have video capability.   Some vital signs may be absent or patient reported.   Willette Brace, LPN   Subjective:   Mackenzie Wood is a 75 y.o. female who presents for Medicare Annual (Subsequent) preventive examination.  Review of Systems     Cardiac Risk Factors include: advanced age (>72men, >24 women);hypertension;diabetes mellitus;dyslipidemia     Objective:    There were no vitals filed for this visit. There is no height or weight on file to calculate BMI.  Advanced Directives 02/10/2021 02/05/2020 01/13/2017 06/21/2014  Does Patient Have a Medical Advance Directive? Yes Yes Yes No  Type of Paramedic of [place];Living will [place];Living will [place];Living will -  Copy of [place] in Chart? No - copy requested No - copy requested No - copy requested -  Would patient like information on creating a medical advance directive? - - - No - patient declined information    Current Medications (verified) Outpatient Encounter Medications as of 02/10/2021  Medication Sig   amLODipine (NORVASC) 10 MG tablet TAKE 1 TABLET BY MOUTH EVERY DAY   Calcium Carbonate-Vitamin D  600-400 MG-UNIT tablet Take 1 tablet by mouth 2 (two) times daily.   [employer] Lancets Ultra-Thin 30G MISC USE TO TEST BLOOD SUGAR ONCE DAILY DX11.9   fexofenadine (ALLEGRA) 180 MG tablet Take 1 tablet (180 mg total) by mouth daily.   glucose blood (ONETOUCH ULTRA) test strip TEST 1 EACH BY OTHER ROUTE DAILY E11.9   hydrochlorothiazide (HYDRODIURIL) 25 MG tablet TAKE 1 TABLET DAILY   metFORMIN (GLUCOPHAGE) 500 MG tablet TAKE 1 TABLET TWICE A DAY   Omega-3 Fatty Acids (FISH OIL) 1000 MG CAPS Take 1 capsule by mouth 2 (two) times daily.   SYNTHROID 88 MCG tablet TAKE 1 TABLET DAILY   [DISCONTINUED] hyoscyamine (LEVSIN, ANASPAZ) 0.125 MG tablet 1-2 tabs q4h prn (Patient not taking: Reported on 02/10/2021)   No facility-administered encounter medications on file as of 02/10/2021.    Allergies (verified) Lisinopril   History: Past Medical History:  Diagnosis Date   Angioedema 01/2018   Autoimmune urticaria/angioedema-->needs chronic nonsedating antihistamine and an H2 blocker indefinitely.  1 episode ->ASA and ARB d/c'd as precaution.  Then had another episode a couple weeks later.  Another episode just prior to seen allergist--allergist did blood workup/allergy testing->dx of autoimmune urticaria.   Beta thalassemia, heterozygous    vs alpha thalassemia minor (pt's family originally from [place]).   CAP (community acquired pneumonia) 02/2010   Colon cancer screening    Pt declines colonoscopy.  Cologuard negative 01/2018-->repeat 3 yrs.   Congenital sensorineural hearing loss    deaf in right ear; mild presbycusis in left ear (audiologist 2014)   Depression    HTN (hypertension)    Hyperkalemia 07/17/2017  Decreased ARB dose, added low dose hctz, low K diet---repeat K normal.   Hyperlipidemia, mixed    Refuses statins   Hypothyroidism    Need for shingles vaccine    PT DECLINES   Osteopenia    DEXAs 2003-2013--pt has declined bisphosphonates   Somnolence, daytime, controlled 04/22/2012    Type II or unspecified type diabetes mellitus without mention of complication, uncontrolled    Vitamin D deficiency    Past Surgical History:  Procedure Laterality Date   BREAST BIOPSY     Left breast biopsy x 2 (fibrocystic/benign)   COLONOSCOPY  2008   Normal.  10 yr recall (initial colonoscopy done in [place]--needs referral to local GI in 2018.   TONSILLECTOMY AND ADENOIDECTOMY     as a child   Family History  Problem Relation Age of Onset   Hypertension Mother    Hypertension Father    Social History   Socioeconomic History   Marital status: Married    Spouse name: Not on file   Number of children: Not on file   Years of education: Not on file   Highest education level: Not on file  Occupational History   Not on file  Tobacco Use   Smoking status: Never   Smokeless tobacco: Never  Vaping Use   Vaping Use: Never used  Substance and Sexual Activity   Alcohol use: No   Drug use: No   Sexual activity: Not on file  Other Topics Concern   Not on file  Social History Narrative   Married, 2 daughters, 3 grandchildren.   Retired Archivist.  Relocated from [place] around 2010.   No T/A/Ds.  Exercise: sedentary life + no formal exercise at all.   Diet: regular            Social Determinants of Health   Financial Resource Strain: Low Risk    Difficulty of Paying Living Expenses: Not hard at all  Food Insecurity: No Food Insecurity   Worried About Charity fundraiser in the Last Year: Never true   [place] in the Last Year: Never true  Transportation Needs: No Transportation Needs   Lack of Transportation (Medical): No   Lack of Transportation (Non-Medical): No  Physical Activity: Inactive   Days of Exercise per Week: 0 days   Minutes of Exercise per Session: 0 min  Stress: No Stress Concern Present   Feeling of Stress : Not at all  Social Connections: Socially Integrated   Frequency of Communication with Friends and Family: More than three  times a week   Frequency of Social Gatherings with Friends and Family: More than three times a week   Attends Religious Services: More than 4 times per year   Active Member of [employer] or Organizations: Yes   Attends Archivist Meetings: 1 to 4 times per year   Marital Status: Married    Tobacco Counseling Counseling given: Not Answered   Clinical Intake:  Pre-visit preparation completed: Yes  Pain : No/denies pain     BMI - recorded: 26.9 Nutritional Status: BMI 25 -29 Overweight Nutritional Risks: None Diabetes: Yes CBG done?: Yes (130) CBG resulted in Enter/ Edit results?: No Did pt. bring in CBG monitor from home?: No  How often do you need to have someone help you when you read instructions, pamphlets, or other written materials from your doctor or pharmacy?: 1 - Never  Diabetic?Nutrition Risk Assessment:  Has the patient had any N/V/D within the  last 2 months?  No  Does the patient have any non-healing wounds?  No  Has the patient had any unintentional weight loss or weight gain?  No   Diabetes:  Is the patient diabetic?  Yes  If diabetic, was a CBG obtained today?  Yes  Did the patient bring in their glucometer from home?  No  How often do you monitor your CBG's? As need.   Financial Strains and Diabetes Management:  Are you having any financial strains with the device, your supplies or your medication? No .  Does the patient want to be seen by Chronic Care Management for management of their diabetes?  No  Would the patient like to be referred to a Nutritionist or for Diabetic Management?  No   Diabetic Exams:  Diabetic Eye Exam: Completed 12/16/20 Diabetic Foot Exam: Completed 04/30/20   Interpreter Needed?: No  Information entered by :: Charlott Rakes, LPN   Activities of Daily Living In your present state of health, do you have any difficulty performing the following activities: 02/10/2021  Hearing? Y  Vision? N  Difficulty concentrating  or making decisions? N  Walking or climbing stairs? N  Dressing or bathing? N  Doing errands, shopping? N  Preparing Food and eating ? N  Using the Toilet? N  In the past six months, have you accidently leaked urine? N  Do you have problems with loss of bowel control? N  Managing your Medications? N  Managing your Finances? N  Housekeeping or managing your Housekeeping? N  Some recent data might be hidden    Patient Care Team: Tammi Sou, MD as PCP - General (Family Medicine) Melida Quitter, MD as Consulting Physician (Otolaryngology) Bobbitt, [place] Muta, MD as Consulting Physician (Allergy and Immunology)  Indicate any recent Medical Services you may have received from other than [facility] providers in the past year (date may be approximate).     Assessment:   This is a routine wellness examination for Mackenzie Wood.  Hearing/Vision screen Hearing Screening - Comments:: Pt wears hearing aids deaf in right ear  Vision Screening - Comments:: Pt follows up with Dr Mendel Ryder for annual eye exams   Dietary issues and exercise activities discussed: Current Exercise Habits: The patient does not participate in regular exercise at present   Goals Addressed             This Visit's Progress    Patient Stated       None at this time       Depression Screen PHQ 2/9 Scores 02/10/2021 02/05/2020 05/29/2018 12/05/2017 01/13/2017 01/13/2017 01/08/2016  PHQ - 2 Score 0 2 0 0 - - 0  PHQ- 9 Score - 2 2 - - - -  Exception Documentation - - - - Patient refusal Patient refusal -    Fall Risk Fall Risk  02/10/2021 02/05/2020 02/20/2019 10/15/2018 12/05/2017  Falls in the past year? 0 0 0 0 No  Comment - - Emmi Telephone Survey: data to providers prior to load - -  Number falls in past yr: 0 0 - 0 -  Injury with Fall? 0 0 - 0 -  Risk for fall due to : Impaired vision - - - -  Follow up Falls prevention discussed Falls prevention discussed - Falls evaluation completed -    FALL RISK  PREVENTION PERTAINING TO THE HOME:  Any stairs in or around the home? Yes  If so, are there any without handrails? No  Home free of loose throw  rugs in walkways, pet beds, electrical cords, etc? Yes  Adequate lighting in your home to reduce risk of falls? Yes   ASSISTIVE DEVICES UTILIZED TO PREVENT FALLS:  Life alert? No  Use of a cane, walker or w/c? No  Grab bars in the bathroom? Yes  Shower chair or bench in shower? Yes  Elevated toilet seat or a handicapped toilet? No   TIMED UP AND GO:  Was the test performed? No .  Cognitive Function:     6CIT Screen 02/10/2021  What Year? 0 points  What month? 0 points  What time? 0 points  Count back from 20 0 points  Months in reverse 0 points  Repeat phrase 0 points  Total Score 0    Immunizations Immunization History  Administered Date(s) Administered   Fluad Quad(high Dose 65+) 01/16/2019, 03/17/2020   Influenza Split 03/26/2010, 12/27/2011   Influenza, High Dose Seasonal PF 01/02/2015, 01/08/2016, 01/13/2017   Influenza,inj,Quad PF,6+ Mos 03/04/2013, 03/03/2014   Influenza-Unspecified 03/25/2011, 01/26/2018   PFIZER(Purple Top)SARS-COV-2 Vaccination 05/12/2019, 06/04/2019, 02/26/2020   PPD Test 03/05/2008   Pneumococcal Conjugate-13 09/08/2015   Pneumococcal Polysaccharide-23 07/07/2014   Pneumococcal-Unspecified 03/26/2009   Tdap 03/28/2006   Zoster, Live 03/26/2009      Flu Vaccine status: Due, Education has been provided regarding the importance of this vaccine. Advised may receive this vaccine at local pharmacy or Health Dept. Aware to provide a copy of the vaccination record if obtained from local pharmacy or Health Dept. Verbalized acceptance and understanding.  Pneumococcal vaccine status: Up to date  Covid-19 vaccine status: Completed vaccines  Qualifies for Shingles Vaccine? Yes   Zostavax completed No   Shingrix Completed?: No.    Education has been provided regarding the importance of this vaccine.  Patient has been advised to call insurance company to determine out of pocket expense if they have not yet received this vaccine. Advised may also receive vaccine at local pharmacy or Health Dept. Verbalized acceptance and understanding.  Screening Tests Health Maintenance  Topic Date Due   Zoster Vaccines- Shingrix (1 of 2) Never done   COVID-19 Vaccine (4 - Booster for Pfizer series) 04/22/2020   INFLUENZA VACCINE  10/26/2020   FOOT EXAM  04/30/2021   HEMOGLOBIN A1C  04/30/2021   URINE MICROALBUMIN  04/30/2021   OPHTHALMOLOGY EXAM  12/16/2021   MAMMOGRAM  02/05/2022   Pneumonia Vaccine 74+ Years old  Completed   DEXA SCAN  Completed   HPV VACCINES  Aged Out   TETANUS/TDAP  Discontinued   Hepatitis C Screening  Discontinued    Health Maintenance  Health Maintenance Due  Topic Date Due   Zoster Vaccines- Shingrix (1 of 2) Never done   COVID-19 Vaccine (4 - Booster for [place] series) 04/22/2020   INFLUENZA VACCINE  10/26/2020    Colorectal cancer screening pt stated she received a cologuard in mail  Mammogram status: Completed 02/05/21. Repeat every year  Bone Density status: Completed 05/09/11. Results reflect: Bone density results: OSTEOPENIA. Repeat every 2 years.    Additional Screening:  Hepatitis C Screening: does not qualify  Vision Screening: Recommended annual ophthalmology exams for early detection of glaucoma and other disorders of the eye. Is the patient up to date with their annual eye exam?  Yes  Who is the provider or what is the name of the office in which the patient attends annual eye exams? Dr Mendel Ryder  If pt is not established with a provider, would they like to be referred to a provider  to establish care? No .   Dental Screening: Recommended annual dental exams for proper oral hygiene  Community Resource Referral / Chronic Care Management: CRR required this visit?  No   CCM required this visit?  No      Plan:     I have personally reviewed  and noted the following in the patient's chart:   Medical and social history Use of alcohol, tobacco or illicit drugs  Current medications and supplements including opioid prescriptions.  Functional ability and status Nutritional status Physical activity Advanced directives List of other physicians Hospitalizations, surgeries, and ER visits in previous 12 months Vitals Screenings to include cognitive, depression, and falls Referrals and appointments  In addition, I have reviewed and discussed with patient certain preventive protocols, quality metrics, and best practice recommendations. A written personalized care plan for preventive services as well as general preventive health recommendations were provided to patient.     Willette Brace, LPN   [id number]   Nurse Notes: pt stated that she will run out both medications before appt. She stated that she was told the HCTZ was to early and the amlodipine will also be out before appt on 02/23/21. She is requesting that both meds be called in for 90 days if she is to continue on with them. Please advise

## 2021-02-10 NOTE — Patient Instructions (Signed)
Ms. Mackenzie Wood , Thank you for taking time to come for your Medicare Wellness Visit. I appreciate your ongoing commitment to your health goals. Please review the following plan we discussed and let me know if I can assist you in the future.   Screening recommendations/referrals: Colonoscopy: Cologuard sent to patient Mammogram: Done 02/05/21 repeat every year Bone Density: Done 05/09/11 repeat every 2 years Recommended yearly ophthalmology/optometry visit for glaucoma screening and checkup Recommended yearly dental visit for hygiene and checkup  Vaccinations: Influenza vaccine: Due and discussed Pneumococcal vaccine: Up to date Shingles vaccine: Shingrix discussed. Please contact your pharmacy for coverage information.    Covid-19:Completed 2/14, 3/9, & 03/07/20  Advanced directives: Please bring a copy of your health care power of attorney and living will to the office at your convenience.  Conditions/risks identified: None at this time   Next appointment: Follow up in one year for your annual wellness visit    Preventive Care 65 Years and Older, Female Preventive care refers to lifestyle choices and visits with your health care provider that can promote health and wellness. What does preventive care include? A yearly physical exam. This is also called an annual well check. Dental exams once or twice a year. Routine eye exams. Ask your health care provider how often you should have your eyes checked. Personal lifestyle choices, including: Daily care of your teeth and gums. Regular physical activity. Eating a healthy diet. Avoiding tobacco and drug use. Limiting alcohol use. Practicing safe sex. Taking low-dose aspirin every day. Taking vitamin and mineral supplements as recommended by your health care provider. What happens during an annual well check? The services and screenings done by your health care provider during your annual well check will depend on your age, overall  health, lifestyle risk factors, and family history of disease. Counseling  Your health care provider may ask you questions about your: Alcohol use. Tobacco use. Drug use. Emotional well-being. Home and relationship well-being. Sexual activity. Eating habits. History of falls. Memory and ability to understand (cognition). Work and work Statistician. Reproductive health. Screening  You may have the following tests or measurements: Height, weight, and BMI. Blood pressure. Lipid and cholesterol levels. These may be checked every 5 years, or more frequently if you are over 18 years old. Skin check. Lung cancer screening. You may have this screening every year starting at age 77 if you have a 30-pack-year history of smoking and currently smoke or have quit within the past 15 years. Fecal occult blood test (FOBT) of the stool. You may have this test every year starting at age 77. Flexible sigmoidoscopy or colonoscopy. You may have a sigmoidoscopy every 5 years or a colonoscopy every 10 years starting at age 77. Hepatitis C blood test. Hepatitis B blood test. Sexually transmitted disease (STD) testing. Diabetes screening. This is done by checking your blood sugar (glucose) after you have not eaten for a while (fasting). You may have this done every 1-3 years. Bone density scan. This is done to screen for osteoporosis. You may have this done starting at age 77. Mammogram. This may be done every 1-2 years. Talk to your health care provider about how often you should have regular mammograms. Talk with your health care provider about your test results, treatment options, and if necessary, the need for more tests. Vaccines  Your health care provider may recommend certain vaccines, such as: Influenza vaccine. This is recommended every year. Tetanus, diphtheria, and acellular pertussis (Tdap, Td) vaccine. You may need a  Td booster every 10 years. Zoster vaccine. You may need this after age  77. Pneumococcal 13-valent conjugate (PCV13) vaccine. One dose is recommended after age 77. Pneumococcal polysaccharide (PPSV23) vaccine. One dose is recommended after age 77. Talk to your health care provider about which screenings and vaccines you need and how often you need them. This information is not intended to replace advice given to you by your health care provider. Make sure you discuss any questions you have with your health care provider. Document Released: 04/10/2015 Document Revised: 12/02/2015 Document Reviewed: 01/13/2015 Elsevier Interactive Patient Education  2017 [employer] Prevention in the Home Falls can cause injuries. They can happen to people of all ages. There are many things you can do to make your home safe and to help prevent falls. What can I do on the outside of my home? Regularly fix the edges of walkways and driveways and fix any cracks. Remove anything that might make you trip as you walk through a door, such as a raised step or threshold. Trim any bushes or trees on the path to your home. Use bright outdoor lighting. Clear any walking paths of anything that might make someone trip, such as rocks or tools. Regularly check to see if handrails are loose or broken. Make sure that both sides of any steps have handrails. Any raised decks and porches should have guardrails on the edges. Have any leaves, snow, or ice cleared regularly. Use sand or salt on walking paths during winter. Clean up any spills in your garage right away. This includes oil or grease spills. What can I do in the bathroom? Use night lights. Install grab bars by the toilet and in the tub and shower. Do not use towel bars as grab bars. Use non-skid mats or decals in the tub or shower. If you need to sit down in the shower, use a plastic, non-slip stool. Keep the floor dry. Clean up any water that spills on the floor as soon as it happens. Remove soap buildup in the tub or shower  regularly. Attach bath mats securely with double-sided non-slip rug tape. Do not have throw rugs and other things on the floor that can make you trip. What can I do in the bedroom? Use night lights. Make sure that you have a light by your bed that is easy to reach. Do not use any sheets or blankets that are too big for your bed. They should not hang down onto the floor. Have a firm chair that has side arms. You can use this for support while you get dressed. Do not have throw rugs and other things on the floor that can make you trip. What can I do in the kitchen? Clean up any spills right away. Avoid walking on wet floors. Keep items that you use a lot in easy-to-reach places. If you need to reach something above you, use a strong step stool that has a grab bar. Keep electrical cords out of the way. Do not use floor polish or wax that makes floors slippery. If you must use wax, use non-skid floor wax. Do not have throw rugs and other things on the floor that can make you trip. What can I do with my stairs? Do not leave any items on the stairs. Make sure that there are handrails on both sides of the stairs and use them. Fix handrails that are broken or loose. Make sure that handrails are as long as the stairways. Check any  carpeting to make sure that it is firmly attached to the stairs. Fix any carpet that is loose or worn. Avoid having throw rugs at the top or bottom of the stairs. If you do have throw rugs, attach them to the floor with carpet tape. Make sure that you have a light switch at the top of the stairs and the bottom of the stairs. If you do not have them, ask someone to add them for you. What else can I do to help prevent falls? Wear shoes that: Do not have high heels. Have rubber bottoms. Are comfortable and fit you well. Are closed at the toe. Do not wear sandals. If you use a stepladder: Make sure that it is fully opened. Do not climb a closed stepladder. Make sure that  both sides of the stepladder are locked into place. Ask someone to hold it for you, if possible. Clearly mark and make sure that you can see: Any grab bars or handrails. First and last steps. Where the edge of each step is. Use tools that help you move around (mobility aids) if they are needed. These include: Canes. Walkers. Scooters. Crutches. Turn on the lights when you go into a dark area. Replace any light bulbs as soon as they burn out. Set up your furniture so you have a clear path. Avoid moving your furniture around. If any of your floors are uneven, fix them. If there are any pets around you, be aware of where they are. Review your medicines with your doctor. Some medicines can make you feel dizzy. This can increase your chance of falling. Ask your doctor what other things that you can do to help prevent falls. This information is not intended to replace advice given to you by your health care provider. Make sure you discuss any questions you have with your health care provider. Document Released: 01/08/2009 Document Revised: 08/20/2015 Document Reviewed: 04/18/2014 Elsevier Interactive Patient Education  2017 [employer].

## 2021-02-12 ENCOUNTER — Other Ambulatory Visit: Payer: Self-pay | Admitting: Family Medicine

## 2021-02-12 DIAGNOSIS — E119 Type 2 diabetes mellitus without complications: Secondary | ICD-10-CM

## 2021-02-22 ENCOUNTER — Other Ambulatory Visit: Payer: Self-pay | Admitting: Family Medicine

## 2021-02-23 NOTE — Telephone Encounter (Signed)
Pt has upcoming appt tomorrow 

## 2021-02-24 ENCOUNTER — Ambulatory Visit (INDEPENDENT_AMBULATORY_CARE_PROVIDER_SITE_OTHER): Payer: Medicare Other | Admitting: Family Medicine

## 2021-02-24 ENCOUNTER — Other Ambulatory Visit: Payer: Self-pay

## 2021-02-24 ENCOUNTER — Encounter: Payer: Self-pay | Admitting: Family Medicine

## 2021-02-24 VITALS — BP 120/74 | HR 71 | Temp 97.4°F | Ht 59.0 in | Wt 136.6 lb

## 2021-02-24 DIAGNOSIS — Z87898 Personal history of other specified conditions: Secondary | ICD-10-CM

## 2021-02-24 DIAGNOSIS — L501 Idiopathic urticaria: Secondary | ICD-10-CM | POA: Diagnosis not present

## 2021-02-24 DIAGNOSIS — I10 Essential (primary) hypertension: Secondary | ICD-10-CM

## 2021-02-24 DIAGNOSIS — E119 Type 2 diabetes mellitus without complications: Secondary | ICD-10-CM

## 2021-02-24 DIAGNOSIS — Z23 Encounter for immunization: Secondary | ICD-10-CM | POA: Diagnosis not present

## 2021-02-24 DIAGNOSIS — E7800 Pure hypercholesterolemia, unspecified: Secondary | ICD-10-CM | POA: Diagnosis not present

## 2021-02-24 LAB — HEMOGLOBIN A1C: Hgb A1c MFr Bld: 7.2 % — ABNORMAL HIGH (ref 4.6–6.5)

## 2021-02-24 LAB — COMPREHENSIVE METABOLIC PANEL
ALT: 16 U/L (ref 0–35)
AST: 25 U/L (ref 0–37)
Albumin: 4.3 g/dL (ref 3.5–5.2)
Alkaline Phosphatase: 63 U/L (ref 39–117)
BUN: 19 mg/dL (ref 6–23)
CO2: 28 mEq/L (ref 19–32)
Calcium: 9.3 mg/dL (ref 8.4–10.5)
Chloride: 98 mEq/L (ref 96–112)
Creatinine, Ser: 0.86 mg/dL (ref 0.40–1.20)
GFR: 65.07 mL/min (ref >60.00)
Glucose, Bld: 220 mg/dL — ABNORMAL HIGH (ref 70–99)
Potassium: 3.4 mEq/L — ABNORMAL LOW (ref 3.5–5.1)
Sodium: 138 mEq/L (ref 135–145)
Total Bilirubin: 0.5 mg/dL (ref 0.2–1.2)
Total Protein: 6.8 g/dL (ref 6.0–8.3)

## 2021-02-24 MED ORDER — HYDROCHLOROTHIAZIDE 25 MG PO TABS: 25.0000 mg | ORAL_TABLET | Freq: Every day | ORAL | 3 refills | Status: DC

## 2021-02-24 MED ORDER — LEVOTHYROXINE SODIUM 88 MCG PO TABS: 88.0000 ug | ORAL_TABLET | Freq: Every day | ORAL | 3 refills | Status: DC

## 2021-02-24 MED ORDER — AMLODIPINE BESYLATE 10 MG PO TABS: 10.0000 mg | ORAL_TABLET | Freq: Every day | ORAL | 3 refills | Status: DC

## 2021-02-24 NOTE — Progress Notes (Signed)
OFFICE VISIT  02/24/2021  CC:  Chief Complaint  Patient presents with   Follow-up    RCI; pt is not fasting   HPI:    Patient is a 75 y.o. female who presents for 3 mo f/u DM, HTN, hypothyroidism. A/P as of last visit: "1) HTN, not ideal control but we decided to hold off on any med changes/additions today. Avoiding ARB/ACE. Could at BB or clonidine or alpha blocker in future if bp steadily climbing. Lytes/cr normal 1 mo ago.   2) DM 2, not ideal control but acceptable a1c 1 mo ago. Cont metformin 500 bid for now. Recheck a1c 3 mo.   3) Hx angioedema and recurrent idiopathic urticaria: Resolved, continue allegra 180 qd.   4) Chronic microcytosis, borderline low Hb. 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 No new labs today. Reassured pt.  No treatment or specialist referral needed at this time.   5) Preventative health: she declines shingrix today. She'll get flu vaccine later this fall."  INTERIM HX: Mackenzie Wood is feeling well. Reviewed home blood pressure monitoring and average of 130/80.  Reviewed home glucoses and her fasting numbers are anywhere from 110-140, with occasional 150.  No checks later in the day.  She does admit to a fair amount of days of just taking the metformin once a day.  Since I last saw her she has had 1 episode of mild swelling of the upper lip and separate episode of some swelling in the periorbital areas.  This resolved with addition of her Benadryl to daily Allegra.  ROS as above, plus--> no fevers, no CP, no SOB, no wheezing, no cough, no dizziness, no HAs, no rashes, no melena/hematochezia.  No polyuria or polydipsia.  No myalgias or arthralgias.  No focal weakness, paresthesias, or tremors.  No acute vision or hearing abnormalities.  No dysuria or unusual/new urinary urgency or frequency.  No recent changes in lower legs. No n/v/d or abd pain.  No palpitations.     Past Medical History:  Diagnosis Date    Angioedema 01/2018   Autoimmune urticaria/angioedema-->needs chronic nonsedating antihistamine and an H2 blocker indefinitely.  1 episode ->ASA and ARB d/c'd as precaution.  Then had another episode a couple weeks later.  Another episode just prior to seen allergist--allergist did blood workup/allergy testing->dx of autoimmune urticaria.   Beta thalassemia, heterozygous    vs alpha thalassemia minor (pt's family originally from [place]).   CAP (community acquired pneumonia) 02/2010   Colon cancer screening    Pt declines colonoscopy.  Cologuard negative 01/2018-->repeat 3 yrs.   Congenital sensorineural hearing loss    deaf in right ear; mild presbycusis in left ear (audiologist 2014)   Depression    HTN (hypertension)    Hyperkalemia 07/17/2017   Decreased ARB dose, added low dose hctz, low K diet---repeat K normal.   Hyperlipidemia, mixed    Refuses statins   Hypothyroidism    Need for shingles vaccine    PT DECLINES   Osteopenia    DEXAs 2003-2013--pt has declined bisphosphonates   Somnolence, daytime, controlled 04/22/2012   Type II or unspecified type diabetes mellitus without mention of complication, uncontrolled    Vitamin D deficiency     Past Surgical History:  Procedure Laterality Date   BREAST BIOPSY     Left breast biopsy x 2 (fibrocystic/benign)   COLONOSCOPY  2008   Normal.  10 yr recall (initial colonoscopy done in [place]--needs referral to local GI in  2018.   TONSILLECTOMY AND ADENOIDECTOMY     as a child    Outpatient Medications Prior to Visit  Medication Sig Dispense Refill   Calcium Carbonate-Vitamin D 600-400 MG-UNIT tablet Take 1 tablet by mouth 2 (two) times daily.     [employer] Lancets Ultra-Thin 30G MISC USE TO TEST BLOOD SUGAR ONCE DAILY DX11.9 200 each 3   fexofenadine (ALLEGRA) 180 MG tablet Take 1 tablet (180 mg total) by mouth daily. 90 tablet 3   glucose blood (ONETOUCH ULTRA) test strip USE TO TEST BLOOD SUGAR ONCE DAILY 100 strip 2   metFORMIN  (GLUCOPHAGE) 500 MG tablet TAKE 1 TABLET TWICE A DAY 180 tablet 1   Omega-3 Fatty Acids (FISH OIL) 1000 MG CAPS Take 1 capsule by mouth 2 (two) times daily.     amLODipine (NORVASC) 10 MG tablet TAKE 1 TABLET BY MOUTH EVERY DAY 90 tablet 0   hydrochlorothiazide (HYDRODIURIL) 25 MG tablet TAKE 1 TABLET DAILY 90 tablet 1   SYNTHROID 88 MCG tablet TAKE 1 TABLET DAILY 90 tablet 1   No facility-administered medications prior to visit.    Allergies  Allergen Reactions   Lisinopril Swelling    NOT CLEAR ASSOCIATION    ROS As per HPI  PE: Vitals with BMI 02/24/2021 11/24/2020 10/28/2020  Height 4\' 11"  4\' 11"  -  Weight 136 lbs 10 oz 133 lbs 3 oz -  BMI 59.93 57.01 -  Systolic 779 390 300  Diastolic 74 70 78  Pulse 71 84 -   Gen: Alert, well appearing.  Patient is oriented to person, place, time, and situation. AFFECT: pleasant, lucid thought and speech. CV: RRR, no m/r/g.   LUNGS: CTA bilat, nonlabored resps, good aeration in all lung fields. EXT: no clubbing or cyanosis.  no edema.    LABS:  Lab Results  Component Value Date   TSH 3.14 10/28/2020   Lab Results  Component Value Date   WBC 6.2 10/28/2020   HGB 11.7 10/29/2020   HCT 37.5 10/29/2020   MCV 65.1 (L) 10/29/2020   PLT 240.0 10/28/2020   Lab Results  Component Value Date   CREATININE 0.83 10/28/2020   BUN 17 10/28/2020   NA 139 10/28/2020   K 4.2 10/28/2020   CL 99 10/28/2020   CO2 29 10/28/2020   Lab Results  Component Value Date   ALT 16 04/30/2020   AST 16 04/30/2020   ALKPHOS 58 04/30/2020   BILITOT 0.3 04/30/2020   Lab Results  Component Value Date   CHOL 234 (H) 04/30/2020   Lab Results  Component Value Date   HDL 51.20 04/30/2020   Lab Results  Component Value Date   LDLCALC 150 (H) 04/30/2020   Lab Results  Component Value Date   TRIG 161.0 (H) 04/30/2020   Lab Results  Component Value Date   CHOLHDL 5 04/30/2020   Lab Results  Component Value Date   HGBA1C 7.4 (H) 10/28/2020    IMPRESSION AND PLAN:  #1 hypertension, well controlled on amlodipine 10 mg/day and HCTZ 25 mg/day Electrolytes and creatinine today.  2.  Diabetes type 2, fair control on metformin 500 mg twice a day.  Less than ideal compliance with this medication. Hemoglobin A1c and nonfasting glucose today.  3. Hypothyroidism:  last 3 TSHs have been good, most recent 3 mo ago. Plan recheck TSH 3 mo.  #4 history of recurrent angioedema of the face, history of idiopathic urticaria.  Pretty stable since I last saw her.  Continue  Allegra 180/day and add Benadryl 25 mg every 6 as needed flareup. Signs/symptoms to call or return for were reviewed and pt expressed understanding.  5.  Hyperlipidemia, patient has declined statins in the past.  She says she does not want to take this medication last her levels are "very scary.  Last lipid panel February of this year showed LDL 150.  She is not fasting today.  We will try to repeat at her next visit.  6. Colon ca screening: she is due for repeat cologuard at this time->ordered. Breast ca screening: next mammogram due 01/2022. Vaccines->Flu->given today.  She declines shingrix.  Otherwise all utd.  An After Visit Summary was printed and given to the patient.  FOLLOW UP: Return in about 3 months (around 05/25/2021) for routine chronic illness f/u.  Signed:  Crissie Sickles, MD           02/24/2021

## 2021-03-03 ENCOUNTER — Ambulatory Visit: Payer: Medicare Other

## 2021-04-08 ENCOUNTER — Telehealth: Payer: Self-pay | Admitting: Family Medicine

## 2021-04-08 DIAGNOSIS — Z1211 Encounter for screening for malignant neoplasm of colon: Secondary | ICD-10-CM

## 2021-04-08 NOTE — Telephone Encounter (Signed)
Pt called in about a coloscopy kit that get mailed out to her. Pt said she get its every 5 years sent to her. She said it's a home kit/colon kit. I'm not familiar with that kit.  PT CELL: [phone]

## 2021-04-09 NOTE — Telephone Encounter (Signed)
Pt advised new order placed for cologuard kit. She should receive in 7-10 days, voiced understanding.

## 2021-04-09 NOTE — Telephone Encounter (Signed)
This is Cologuard.  She is due for this test for colon cancer screening.  Advise her to complete the kit as instructed.  Thanks.

## 2021-04-09 NOTE — Telephone Encounter (Signed)
Please advise on new order. Last cologuard was 01/2018, negative results.. 3 year repeat recommended

## 2021-04-19 DIAGNOSIS — Z1211 Encounter for screening for malignant neoplasm of colon: Secondary | ICD-10-CM | POA: Diagnosis not present

## 2021-04-24 LAB — COLOGUARD: COLOGUARD: NEGATIVE

## 2021-05-21 ENCOUNTER — Encounter: Payer: Self-pay | Admitting: Family Medicine

## 2021-05-21 ENCOUNTER — Ambulatory Visit (INDEPENDENT_AMBULATORY_CARE_PROVIDER_SITE_OTHER): Payer: Medicare Other | Admitting: Family Medicine

## 2021-05-21 ENCOUNTER — Other Ambulatory Visit: Payer: Self-pay

## 2021-05-21 VITALS — BP 122/71 | HR 80 | Temp 97.9°F | Ht 59.0 in | Wt 139.4 lb

## 2021-05-21 DIAGNOSIS — S40862A Insect bite (nonvenomous) of left upper arm, initial encounter: Secondary | ICD-10-CM

## 2021-05-21 DIAGNOSIS — W57XXXA Bitten or stung by nonvenomous insect and other nonvenomous arthropods, initial encounter: Secondary | ICD-10-CM

## 2021-05-21 MED ORDER — DOXYCYCLINE HYCLATE 100 MG PO CAPS: ORAL_CAPSULE | ORAL | 0 refills | Status: DC

## 2021-05-21 NOTE — Progress Notes (Signed)
OFFICE VISIT  05/21/2021  CC:  Chief Complaint  Patient presents with   Tick bite    Unsure how long it has been there but has been gardening this week. Most recent time 2 days ago. Has not tried to remove it   Patient is a 76 y.o. female who presents for tick bite.  HPI: Noted a tick in left armpit this morning.  She does not know how long its been there.  It does not itch or hurt.  She has felt well lately.  Past Medical History:  Diagnosis Date   Angioedema 01/2018   Autoimmune urticaria/angioedema-->needs chronic nonsedating antihistamine and an H2 blocker indefinitely.  1 episode ->ASA and ARB d/c'd as precaution.  Then had another episode a couple weeks later.  Another episode just prior to seen allergist--allergist did blood workup/allergy testing->dx of autoimmune urticaria.   Beta thalassemia, heterozygous    vs alpha thalassemia minor (pt's family originally from [place]).   CAP (community acquired pneumonia) 02/2010   Colon cancer screening    Pt declines colonoscopy.  Cologuard negative 01/2018-->repeat 3 yrs.   Congenital sensorineural hearing loss    deaf in right ear; mild presbycusis in left ear (audiologist 2014)   Depression    HTN (hypertension)    Hyperkalemia 07/17/2017   Decreased ARB dose, added low dose hctz, low K diet---repeat K normal.   Hyperlipidemia, mixed    Refuses statins   Hypothyroidism    Need for shingles vaccine    PT DECLINES   Osteopenia    DEXAs 2003-2013--pt has declined bisphosphonates   Somnolence, daytime, controlled 04/22/2012   Type II or unspecified type diabetes mellitus without mention of complication, uncontrolled    Vitamin D deficiency     Past Surgical History:  Procedure Laterality Date   BREAST BIOPSY     Left breast biopsy x 2 (fibrocystic/benign)   COLONOSCOPY  2008   Normal.  10 yr recall (initial colonoscopy done in [place]--needs referral to local GI in 2018.   TONSILLECTOMY AND ADENOIDECTOMY     as a child     Outpatient Medications Prior to Visit  Medication Sig Dispense Refill   amLODipine (NORVASC) 10 MG tablet Take 1 tablet (10 mg total) by mouth daily. 90 tablet 3   Calcium Carbonate-Vitamin D 600-400 MG-UNIT tablet Take 1 tablet by mouth 2 (two) times daily.     [employer] Lancets Ultra-Thin 30G MISC USE TO TEST BLOOD SUGAR ONCE DAILY DX11.9 200 each 3   fexofenadine (ALLEGRA) 180 MG tablet Take 1 tablet (180 mg total) by mouth daily. 90 tablet 3   glucose blood (ONETOUCH ULTRA) test strip USE TO TEST BLOOD SUGAR ONCE DAILY 100 strip 2   hydrochlorothiazide (HYDRODIURIL) 25 MG tablet Take 1 tablet (25 mg total) by mouth daily. 90 tablet 3   levothyroxine (SYNTHROID) 88 MCG tablet Take 1 tablet (88 mcg total) by mouth daily. 90 tablet 3   metFORMIN (GLUCOPHAGE) 500 MG tablet TAKE 1 TABLET TWICE A DAY 180 tablet 1   Omega-3 Fatty Acids (FISH OIL) 1000 MG CAPS Take 1 capsule by mouth 2 (two) times daily.     No facility-administered medications prior to visit.    Allergies  Allergen Reactions   Lisinopril Swelling    NOT CLEAR ASSOCIATION    ROS As per HPI  PE: Vitals with BMI 05/21/2021 02/24/2021 11/24/2020  Height 4\' 11"  4\' 11"  4\' 11"   Weight 139 lbs 6 oz 136 lbs 10 oz 133 lbs 3 oz  BMI 28.14 67.34 19.37  Systolic 902 409 735  Diastolic 71 74 70  Pulse 80 71 84     Physical Exam  Exam chaperoned by Shepard General, CMA General: Alert and well-appearing. Left axilla with small, nonengorged tick firmly attached.  No tenderness, no erythema, no swelling.  LABS:  Last metabolic panel Lab Results  Component Value Date   GLUCOSE 220 (H) 02/24/2021   NA 138 02/24/2021   K 3.4 (L) 02/24/2021   CL 98 02/24/2021   CO2 28 02/24/2021   BUN 19 02/24/2021   CREATININE 0.86 02/24/2021   GFRNONAA 81 02/27/2018   CALCIUM 9.3 02/24/2021   PROT 6.8 02/24/2021   ALBUMIN 4.3 02/24/2021   LABGLOB 2.4 02/27/2018   AGRATIO 2.1 02/27/2018   BILITOT 0.5 02/24/2021   ALKPHOS 63 02/24/2021    AST 25 02/24/2021   ALT 16 02/24/2021   IMPRESSION AND PLAN:  Tick bite.  Removed without problem today.   Suspect this has been there only a day or 2. Has the appearance of a deer tick---ixodes scapularis--but not an exact match. However, it resembles this type of tick the most. There is absolutely no skin abnormality at the site of the bite. Doxycycline 200 mg x 1 dose for postexposure prophylaxis for Lyme disease.  Signs/symptoms to call or return for were reviewed and pt expressed understanding.  An After Visit Summary was printed and given to the patient.  FOLLOW UP: No follow-ups on file.  Signed:  Crissie Sickles, Mackenzie Wood           05/21/2021

## 2021-05-26 ENCOUNTER — Ambulatory Visit: Payer: Medicare Other | Admitting: Family Medicine

## 2021-05-27 ENCOUNTER — Other Ambulatory Visit: Payer: Self-pay

## 2021-05-27 ENCOUNTER — Encounter: Payer: Self-pay | Admitting: Family Medicine

## 2021-05-27 ENCOUNTER — Ambulatory Visit (INDEPENDENT_AMBULATORY_CARE_PROVIDER_SITE_OTHER): Payer: Medicare Other | Admitting: Family Medicine

## 2021-05-27 VITALS — BP 111/69 | HR 73 | Temp 97.8°F | Ht 59.0 in | Wt 138.4 lb

## 2021-05-27 DIAGNOSIS — F4323 Adjustment disorder with mixed anxiety and depressed mood: Secondary | ICD-10-CM

## 2021-05-27 DIAGNOSIS — E7800 Pure hypercholesterolemia, unspecified: Secondary | ICD-10-CM

## 2021-05-27 DIAGNOSIS — I10 Essential (primary) hypertension: Secondary | ICD-10-CM

## 2021-05-27 DIAGNOSIS — E039 Hypothyroidism, unspecified: Secondary | ICD-10-CM

## 2021-05-27 DIAGNOSIS — E119 Type 2 diabetes mellitus without complications: Secondary | ICD-10-CM | POA: Diagnosis not present

## 2021-05-27 DIAGNOSIS — L720 Epidermal cyst: Secondary | ICD-10-CM

## 2021-05-27 LAB — BASIC METABOLIC PANEL
BUN: 21 mg/dL (ref 6–23)
CO2: 32 mEq/L (ref 19–32)
Calcium: 9.8 mg/dL (ref 8.4–10.5)
Chloride: 97 mEq/L (ref 96–112)
Creatinine, Ser: 0.9 mg/dL (ref 0.40–1.20)
GFR: 61.51 mL/min (ref >60.00)
Glucose, Bld: 136 mg/dL — ABNORMAL HIGH (ref 70–99)
Potassium: 3.7 mEq/L (ref 3.5–5.1)
Sodium: 139 mEq/L (ref 135–145)

## 2021-05-27 LAB — MICROALBUMIN / CREATININE URINE RATIO
Creatinine,U: 86.8 mg/dL
Microalb Creat Ratio: 1.9 mg/g (ref 0.0–30.0)
Microalb, Ur: 1.7 mg/dL (ref 0.0–1.9)

## 2021-05-27 LAB — TSH: TSH: 4.05 u[IU]/mL (ref 0.35–5.50)

## 2021-05-27 LAB — LIPID PANEL
Cholesterol: 229 mg/dL — ABNORMAL HIGH (ref 0–200)
HDL: 57.5 mg/dL (ref >39.00)
LDL Cholesterol: 139 mg/dL — ABNORMAL HIGH (ref 0–99)
NonHDL: 171.84
Total CHOL/HDL Ratio: 4
Triglycerides: 162 mg/dL — ABNORMAL HIGH (ref 0.0–149.0)
VLDL: 32.4 mg/dL (ref 0.0–40.0)

## 2021-05-27 LAB — HEMOGLOBIN A1C: Hgb A1c MFr Bld: 7.6 % — ABNORMAL HIGH (ref 4.6–6.5)

## 2021-05-27 MED ORDER — METFORMIN HCL 500 MG PO TABS: 500.0000 mg | ORAL_TABLET | Freq: Two times a day (BID) | ORAL | 1 refills | Status: DC

## 2021-05-27 MED ORDER — ONETOUCH ULTRA VI STRP: ORAL_STRIP | 5 refills | Status: DC

## 2021-05-27 NOTE — Progress Notes (Signed)
OFFICE VISIT  05/27/2021  CC:  Chief Complaint  Patient presents with   Diabetes   Hypertension   Patient is a 76 y.o. female who presents for 3 mo f/u DM, HTN, HLD. A/P as of last visit: "#1 hypertension, well controlled on amlodipine 10 mg/day and HCTZ 25 mg/day Electrolytes and creatinine today.  2.  Diabetes type 2, fair control on metformin 500 mg twice a day.  Less than ideal compliance with this medication. Hemoglobin A1c and nonfasting glucose today.   3. Hypothyroidism:  last 3 TSHs have been good, most recent 3 mo ago. Plan recheck TSH 3 mo.   #4 history of recurrent angioedema of the face, history of idiopathic urticaria.  Pretty stable since I last saw her.  Continue Allegra 180/day and add Benadryl 25 mg every 6 as needed flareup. Signs/symptoms to call or return for were reviewed and pt expressed understanding.   5.  Hyperlipidemia, patient has declined statins in the past.  She says she does not want to take this medication last her levels are "very scary".  Last lipid panel February of this year showed LDL 150.  She is not fasting today.  We will try to repeat at her next visit.   6. Colon ca screening: she is due for repeat cologuard at this time->ordered. Breast ca screening: next mammogram due 01/2022. Vaccines->Flu->given today.  She declines shingrix.  Otherwise all utd."  INTERIM HX: Joint is doing fine physically. She is struggling with some tough times with her family, says a daughter is having some emotional problems and at times Elwyn feels somewhat helpless.  She is supporting her and Nekia says that she works through her periods of depressed mood okay at this time.  Home blood pressures have been consistently normal. She feels like her sugars have been a little bit higher lately with the increased stress in her life. Feet: no symptoms.  Has a waxing and waning left mandible region cystic lesion.  Was told by dermatologist that this is nothing to worry  about.  She feels like it may have grown a little lately.  Either that or she has been messing with it more and its grown in response.  It is never opened up and drained.  ROS as above, plus--> no fevers, no CP, no SOB, no wheezing, no cough, no dizziness, no HAs, no rashes, no melena/hematochezia.  No polyuria or polydipsia.  No myalgias or arthralgias.  No focal weakness, paresthesias, or tremors.  No acute vision or hearing abnormalities.  No dysuria or unusual/new urinary urgency or frequency.  No recent changes in lower legs. No n/v/d or abd pain.  No palpitations.     Past Medical History:  Diagnosis Date   Angioedema 01/2018   Autoimmune urticaria/angioedema-->needs chronic nonsedating antihistamine and an H2 blocker indefinitely.  1 episode ->ASA and ARB d/c'd as precaution.  Then had another episode a couple weeks later.  Another episode just prior to seen allergist--allergist did blood workup/allergy testing->dx of autoimmune urticaria.   Beta thalassemia, heterozygous    vs alpha thalassemia minor (pt's family originally from [place]).   CAP (community acquired pneumonia) 02/2010   Colon cancer screening    Pt declines colonoscopy.  Cologuard negative 01/2018-->repeat 3 yrs.   Congenital sensorineural hearing loss    deaf in right ear; mild presbycusis in left ear (audiologist 2014)   Depression    HTN (hypertension)    Hyperkalemia 07/17/2017   Decreased ARB dose, added low dose hctz, low  K diet---repeat K normal.   Hyperlipidemia, mixed    Refuses statins   Hypothyroidism    Need for shingles vaccine    PT DECLINES   Osteopenia    DEXAs 2003-2013--pt has declined bisphosphonates   Somnolence, daytime, controlled 04/22/2012   Type II or unspecified type diabetes mellitus without mention of complication, uncontrolled    Vitamin D deficiency     Past Surgical History:  Procedure Laterality Date   BREAST BIOPSY     Left breast biopsy x 2 (fibrocystic/benign)   COLONOSCOPY   2008   Normal.  10 yr recall (initial colonoscopy done in [place]--needs referral to local GI in 2018.   TONSILLECTOMY AND ADENOIDECTOMY     as a child    Outpatient Medications Prior to Visit  Medication Sig Dispense Refill   amLODipine (NORVASC) 10 MG tablet Take 1 tablet (10 mg total) by mouth daily. 90 tablet 3   Calcium Carbonate-Vitamin D 600-400 MG-UNIT tablet Take 1 tablet by mouth 2 (two) times daily.     [employer] Lancets Ultra-Thin 30G MISC USE TO TEST BLOOD SUGAR ONCE DAILY DX11.9 200 each 3   fexofenadine (ALLEGRA) 180 MG tablet Take 1 tablet (180 mg total) by mouth daily. 90 tablet 3   hydrochlorothiazide (HYDRODIURIL) 25 MG tablet Take 1 tablet (25 mg total) by mouth daily. 90 tablet 3   levothyroxine (SYNTHROID) 88 MCG tablet Take 1 tablet (88 mcg total) by mouth daily. 90 tablet 3   Omega-3 Fatty Acids (FISH OIL) 1000 MG CAPS Take 1 capsule by mouth 2 (two) times daily.     doxycycline (VIBRAMYCIN) 100 MG capsule 2 tabs po x 1 dose (Patient not taking: Reported on 05/27/2021) 2 capsule 0   glucose blood (ONETOUCH ULTRA) test strip USE TO TEST BLOOD SUGAR ONCE DAILY 100 strip 2   metFORMIN (GLUCOPHAGE) 500 MG tablet TAKE 1 TABLET TWICE A DAY 180 tablet 1   No facility-administered medications prior to visit.    Allergies  Allergen Reactions   Lisinopril Swelling    NOT CLEAR ASSOCIATION    ROS As per HPI  PE: Vitals with BMI 05/27/2021 05/21/2021 02/24/2021  Height 4\' 11"  4\' 11"  4\' 11"   Weight 138 lbs 6 oz 139 lbs 6 oz 136 lbs 10 oz  BMI 27.94 16.10 96.04  Systolic 540 981 191  Diastolic 69 71 74  Pulse 73 80 71   Physical Exam  Gen: Alert, well appearing.  Patient is oriented to person, place, time, and situation. AFFECT: pleasant, lucid thought and speech. CV: RRR, no m/r/g.   LUNGS: CTA bilat, nonlabored resps, good aeration in all lung fields. EXT: no clubbing or cyanosis.  no edema.  Foot exam -  no swelling, tenderness or skin or vascular lesions. Color  and temperature is normal. Sensation is intact. Peripheral pulses are palpable. Toenails are normal. Skin: Just inferior to the angle of the mandible on the left she has a 2 cm soft and mobile subcu nodule with mild overlying erythema.  No significant fluctuance. Manger of neck is normal.  LABS:  Last CBC Lab Results  Component Value Date   WBC 6.2 10/28/2020   HGB 11.7 10/29/2020   HCT 37.5 10/29/2020   MCV 65.1 (L) 10/29/2020   MCH 20.3 (L) 10/29/2020   RDW 17.1 (H) 10/29/2020   PLT 240.0 47/82/9562   Last metabolic panel Lab Results  Component Value Date   GLUCOSE 220 (H) 02/24/2021   NA 138 02/24/2021   K 3.4 (  L) 02/24/2021   CL 98 02/24/2021   CO2 28 02/24/2021   BUN 19 02/24/2021   CREATININE 0.86 02/24/2021   GFRNONAA 81 02/27/2018   CALCIUM 9.3 02/24/2021   PROT 6.8 02/24/2021   ALBUMIN 4.3 02/24/2021   LABGLOB 2.4 02/27/2018   AGRATIO 2.1 02/27/2018   BILITOT 0.5 02/24/2021   ALKPHOS 63 02/24/2021   AST 25 02/24/2021   ALT 16 02/24/2021   Last lipids Lab Results  Component Value Date   CHOL 234 (H) 04/30/2020   HDL 51.20 04/30/2020   LDLCALC 150 (H) 04/30/2020   LDLDIRECT 126.0 01/06/2016   TRIG 161.0 (H) 04/30/2020   CHOLHDL 5 04/30/2020   Last hemoglobin A1c Lab Results  Component Value Date   HGBA1C 7.2 (H) 02/24/2021   Last thyroid functions Lab Results  Component Value Date   TSH 3.14 10/28/2020   IMPRESSION AND PLAN:  #1 left neck epidermal cyst. Waxing and waning. Encouraged use of warm compresses, monitor, let me know if things worsening.  2.  Type 2 diabetes without complication. Continue metformin 500 mg twice daily. Hemoglobin A1c and fasting glucose today. Urine microalbumin/creatinine today.  Feet exam normal today.  3.  Hypertension, well controlled on amlodipine 10 mg a day and HCTZ 25 mg a day. Electrolytes and creatinine today.  4. Hyperlipidemia, patient has declined statins in the past.  She says she does not want to  take this medication last her levels are "very scary".  Last lipid panel February of this year showed LDL 150. Lipid panel today.  5.  Hypothyroidism. Stable on 88 mcg levothyroxine daily. TSH monitoring today.  #6 adjustment disorder with mixed anxiety and depressed mood. She is working through things appropriately/in a healthy manner at this time.  An After Visit Summary was printed and given to the patient.  FOLLOW UP: Return in about 3 months (around 08/27/2021) for routine chronic illness f/u.  Signed:  Crissie Sickles, MD           05/27/2021

## 2021-06-02 ENCOUNTER — Other Ambulatory Visit: Payer: Self-pay

## 2021-06-02 ENCOUNTER — Ambulatory Visit (INDEPENDENT_AMBULATORY_CARE_PROVIDER_SITE_OTHER): Payer: Medicare Other | Admitting: Family Medicine

## 2021-06-02 ENCOUNTER — Encounter: Payer: Self-pay | Admitting: Family Medicine

## 2021-06-02 VITALS — BP 119/69 | HR 70 | Temp 97.4°F | Ht 59.0 in | Wt 138.6 lb

## 2021-06-02 DIAGNOSIS — L989 Disorder of the skin and subcutaneous tissue, unspecified: Secondary | ICD-10-CM

## 2021-06-02 DIAGNOSIS — L080 Pyoderma: Secondary | ICD-10-CM | POA: Diagnosis not present

## 2021-06-02 DIAGNOSIS — L729 Follicular cyst of the skin and subcutaneous tissue, unspecified: Secondary | ICD-10-CM

## 2021-06-02 MED ORDER — MUPIROCIN 2 % EX OINT: 1.0000 "application " | TOPICAL_OINTMENT | Freq: Three times a day (TID) | CUTANEOUS | 0 refills | Status: DC

## 2021-06-02 NOTE — Patient Instructions (Signed)
Apply warm compress 20 min 1 to 2 times a day. ? ?Cover with bandaid until the hole seals up. ? ?I'll send in rx for some antibacterial ointment to apply to the area --follow instructions on package label. ?

## 2021-06-02 NOTE — Progress Notes (Signed)
OFFICE VISIT ? ?06/02/2021 ? ?CC:  ?Chief Complaint  ?Patient presents with  ? Cyst  ?  Cyst located on left side of face; jaw area. Redness present along with white head inside. Pt used warm compress as advised.   ? ? ?Patient is a 76 y.o. female who presents for cyst on neck. ? ?HPI: ?Gradually enlarging cyst/nodular lesion on the left side of her neck.  Has some discomfort over it but not significantly painful.  No fever or malaise.  Has been doing warm compresses last couple days and it has not resulted in any improvement.  There is no drainage. ? ?No similar lesions anywhere else. ? ?Past Medical History:  ?Diagnosis Date  ? Angioedema 01/2018  ? Autoimmune urticaria/angioedema-->needs chronic nonsedating antihistamine and an H2 blocker indefinitely.  1 episode ->ASA and ARB d/c'd as precaution.  Then had another episode a couple weeks later.  Another episode just prior to seen allergist--allergist did blood workup/allergy testing->dx of autoimmune urticaria.  ? Beta thalassemia, heterozygous   ? vs alpha thalassemia minor (pt's family originally from [place]).  ? CAP (community acquired pneumonia) 02/2010  ? Colon cancer screening   ? Pt declines colonoscopy.  Cologuard negative 01/2018-->repeat 3 yrs.  ? Congenital sensorineural hearing loss   ? deaf in right ear; mild presbycusis in left ear (audiologist 2014)  ? Depression   ? HTN (hypertension)   ? Hyperkalemia 07/17/2017  ? Decreased ARB dose, added low dose hctz, low K diet---repeat K normal.  ? Hyperlipidemia, mixed   ? Refuses statins  ? Hypothyroidism   ? Need for shingles vaccine   ? PT DECLINES  ? Osteopenia   ? DEXAs 2003-2013--pt has declined bisphosphonates  ? Somnolence, daytime, controlled 04/22/2012  ? Type II or unspecified type diabetes mellitus without mention of complication, uncontrolled   ? Vitamin D deficiency   ? ? ?Past Surgical History:  ?Procedure Laterality Date  ? BREAST BIOPSY    ? Left breast biopsy x 2 (fibrocystic/benign)  ?  COLONOSCOPY  2008  ? Normal.  10 yr recall (initial colonoscopy done in [place]--needs referral to local GI in 2018.  ? TONSILLECTOMY AND ADENOIDECTOMY    ? as a child  ? ? ?Outpatient Medications Prior to Visit  ?Medication Sig Dispense Refill  ? amLODipine (NORVASC) 10 MG tablet Take 1 tablet (10 mg total) by mouth daily. 90 tablet 3  ? Calcium Carbonate-Vitamin D 600-400 MG-UNIT tablet Take 1 tablet by mouth 2 (two) times daily.    ? [employer] Lancets Ultra-Thin 30G MISC USE TO TEST BLOOD SUGAR ONCE DAILY DX11.9 200 each 3  ? fexofenadine (ALLEGRA) 180 MG tablet Take 1 tablet (180 mg total) by mouth daily. 90 tablet 3  ? glucose blood (ONETOUCH ULTRA) test strip USE TO TEST BLOOD SUGAR ONCE DAILY 300 strip 5  ? hydrochlorothiazide (HYDRODIURIL) 25 MG tablet Take 1 tablet (25 mg total) by mouth daily. 90 tablet 3  ? levothyroxine (SYNTHROID) 88 MCG tablet Take 1 tablet (88 mcg total) by mouth daily. 90 tablet 3  ? metFORMIN (GLUCOPHAGE) 500 MG tablet Take 1 tablet (500 mg total) by mouth 2 (two) times daily. 180 tablet 1  ? Omega-3 Fatty Acids (FISH OIL) 1000 MG CAPS Take 1 capsule by mouth 2 (two) times daily.    ? ?No facility-administered medications prior to visit.  ? ? ?Allergies  ?Allergen Reactions  ? Lisinopril Swelling  ?  NOT CLEAR ASSOCIATION  ? ? ?ROS ?As per HPI ? ?PE: ?  Vitals with BMI 06/02/2021 05/27/2021 05/21/2021  ?Height '4\' 11"'$  '4\' 11"'$  '4\' 11"'$   ?Weight 138 lbs 10 oz 138 lbs 6 oz 139 lbs 6 oz  ?BMI 27.98 27.94 28.14  ?Systolic 891 694 503  ?Diastolic 69 69 71  ?Pulse 70 73 80  ? ? ? ?Physical Exam ? ?General: Alert and well-appearing. ?Skin: Left side of neck has a 2 cm erythematous nodule that feels rubbery-soft and mobile.  Minimal tenderness to firm palpation.  No surrounding streaking or erythema.  There is no defect in the epidermal surface.  Neck is without palpable adenopathy. ? ?LABS:  ?Last CBC ?Lab Results  ?Component Value Date  ? WBC 6.2 10/28/2020  ? HGB 11.7 10/29/2020  ? HCT 37.5 10/29/2020   ? MCV 65.1 (L) 10/29/2020  ? MCH 20.3 (L) 10/29/2020  ? RDW 17.1 (H) 10/29/2020  ? PLT 240.0 10/28/2020  ? ?Last metabolic panel ?Lab Results  ?Component Value Date  ? GLUCOSE 136 (H) 05/27/2021  ? NA 139 05/27/2021  ? K 3.7 05/27/2021  ? CL 97 05/27/2021  ? CO2 32 05/27/2021  ? BUN 21 05/27/2021  ? CREATININE 0.90 05/27/2021  ? GFRNONAA 81 02/27/2018  ? CALCIUM 9.8 05/27/2021  ? PROT 6.8 02/24/2021  ? ALBUMIN 4.3 02/24/2021  ? LABGLOB 2.4 02/27/2018  ? AGRATIO 2.1 02/27/2018  ? BILITOT 0.5 02/24/2021  ? ALKPHOS 63 02/24/2021  ? AST 25 02/24/2021  ? ALT 16 02/24/2021  ? ?Last hemoglobin A1c ?Lab Results  ?Component Value Date  ? HGBA1C 7.6 (H) 05/27/2021  ? ?Last thyroid functions ?Lab Results  ?Component Value Date  ? TSH 4.05 05/27/2021  ? ?IMPRESSION AND PLAN: ? ?Left-sided neck lesion, gradually enlarging. ?Suspect epidermal inclusion cyst. ?Discussed proceeding with drainage today and patient was agreeable. ?Consent obtained. ?Area prepped in sterile fashion. ?1 cc of 1% plain lidocaine infiltrated for local anesthesia. ?5 mm punch biopsy taken from center of lesion.  Used pressure to express small amount of thick/globular contents.  No pus or blood.  Portions of the core were put in a specimen cup and sent for dermatology pathology. ?Patient tolerated procedure well.  No immediate complications. ? ?An After Visit Summary was printed and given to the patient. ? ?FOLLOW UP: Return if symptoms worsen or fail to improve. ? ?Signed:  Crissie Sickles, MD           06/02/2021 ? ?

## 2021-08-27 ENCOUNTER — Ambulatory Visit (INDEPENDENT_AMBULATORY_CARE_PROVIDER_SITE_OTHER): Payer: Medicare Other | Admitting: Family Medicine

## 2021-08-27 ENCOUNTER — Encounter: Payer: Self-pay | Admitting: Family Medicine

## 2021-08-27 VITALS — BP 137/78 | HR 69 | Temp 98.0°F | Ht 59.0 in | Wt 135.0 lb

## 2021-08-27 DIAGNOSIS — E119 Type 2 diabetes mellitus without complications: Secondary | ICD-10-CM

## 2021-08-27 DIAGNOSIS — F4323 Adjustment disorder with mixed anxiety and depressed mood: Secondary | ICD-10-CM | POA: Diagnosis not present

## 2021-08-27 LAB — POCT GLYCOSYLATED HEMOGLOBIN (HGB A1C)
HbA1c POC (<> result, manual entry): 6.3 % (ref 4.0–5.6)
HbA1c, POC (controlled diabetic range): 6.3 % (ref 0.0–7.0)
HbA1c, POC (prediabetic range): 6.3 % (ref 5.7–6.4)
Hemoglobin A1C: 6.3 % — AB (ref 4.0–5.6)

## 2021-08-27 MED ORDER — ONETOUCH ULTRA VI STRP: ORAL_STRIP | 5 refills | Status: DC

## 2021-08-27 MED ORDER — FEXOFENADINE HCL 180 MG PO TABS: 180.0000 mg | ORAL_TABLET | Freq: Every day | ORAL | 1 refills | Status: DC

## 2021-08-27 MED ORDER — METFORMIN HCL 500 MG PO TABS
500.0000 mg | ORAL_TABLET | Freq: Two times a day (BID) | ORAL | 1 refills | Status: DC
Start: 2021-08-27 — End: 2022-02-23

## 2021-08-27 MED ORDER — CVS LANCETS ULTRA-THIN 30G MISC: 3 refills | Status: DC

## 2021-08-27 NOTE — Progress Notes (Signed)
OFFICE VISIT  08/27/2021  CC:  Chief Complaint  Patient presents with   Diabetes    Pt is fasting   Hypertension    HPI:    Patient is a 76 y.o. female who presents for 3 mo f/u DM 2, HTN, adjustment d/o. A/P as of last visit: "#1 left neck epidermal cyst. Waxing and waning. Encouraged use of warm compresses, monitor, let me know if things worsening.  2.  Type 2 diabetes without complication. Continue metformin 500 mg twice daily. Hemoglobin A1c and fasting glucose today. Urine microalbumin/creatinine today.  Feet exam normal today.  3.  Hypertension, well controlled on amlodipine 10 mg a day and HCTZ 25 mg a day. Electrolytes and creatinine today.  4. Hyperlipidemia, patient has declined statins in the past.  She says she does not want to take this medication last her levels are "very scary".  Last lipid panel February of this year showed LDL 150. Lipid panel today.  5.  Hypothyroidism. Stable on 88 mcg levothyroxine daily. TSH monitoring today.   #6 adjustment disorder with mixed anxiety and depressed mood. She is working through things appropriately/in a healthy manner at this time."  INTERIM HX: Mackenzie Wood continues to struggle with her daughter.  Her daughter is very depressed and has essentially shut down.  Daughter not working, essentially does not get up out of bed most days.  Mackenzie Wood feels helpless.  She denies feeling clinically depressed at this time, does not want to consider medication treatment. Home blood pressures consistently normal. Home glucoses ranging from 115-150.  The skin lesion on the left side of her face/neck is healing.  ROS as above, plus--> no fevers, no CP, no SOB, no wheezing, no cough, no dizziness, no HAs, no rashes, no melena/hematochezia.  No polyuria or polydipsia.  No myalgias or arthralgias.  No focal weakness, paresthesias, or tremors.  No acute vision or hearing abnormalities.  No dysuria or unusual/new urinary urgency or frequency.  No  recent changes in lower legs. No n/v/d or abd pain.  No palpitations.     Past Medical History:  Diagnosis Date   Angioedema 01/2018   Autoimmune urticaria/angioedema-->needs chronic nonsedating antihistamine and an H2 blocker indefinitely.  1 episode ->ASA and ARB d/c'd as precaution.  Then had another episode a couple weeks later.  Another episode just prior to seen allergist--allergist did blood workup/allergy testing->dx of autoimmune urticaria.   Beta thalassemia, heterozygous    vs alpha thalassemia minor (pt's family originally from [place]).   CAP (community acquired pneumonia) 02/2010   Colon cancer screening    Pt declines colonoscopy.  Cologuard negative 01/2018-->repeat 3 yrs.   Congenital sensorineural hearing loss    deaf in right ear; mild presbycusis in left ear (audiologist 2014)   Depression    HTN (hypertension)    Hyperkalemia 07/17/2017   Decreased ARB dose, added low dose hctz, low K diet---repeat K normal.   Hyperlipidemia, mixed    Refuses statins   Hypothyroidism    Need for shingles vaccine    PT DECLINES   Osteopenia    DEXAs 2003-2013--pt has declined bisphosphonates   Somnolence, daytime, controlled 04/22/2012   Type II or unspecified type diabetes mellitus without mention of complication, uncontrolled    Vitamin D deficiency     Past Surgical History:  Procedure Laterality Date   BREAST BIOPSY     Left breast biopsy x 2 (fibrocystic/benign)   COLONOSCOPY  2008   Normal.  10 yr recall (initial colonoscopy done in  [place]--needs referral to local GI in 2018.   TONSILLECTOMY AND ADENOIDECTOMY     as a child    Outpatient Medications Prior to Visit  Medication Sig Dispense Refill   amLODipine (NORVASC) 10 MG tablet Take 1 tablet (10 mg total) by mouth daily. 90 tablet 3   Calcium Carbonate-Vitamin D 600-400 MG-UNIT tablet Take 1 tablet by mouth 2 (two) times daily.     [employer] Lancets Ultra-Thin 30G MISC USE TO TEST BLOOD SUGAR ONCE DAILY DX11.9  200 each 3   fexofenadine (ALLEGRA) 180 MG tablet Take 1 tablet (180 mg total) by mouth daily. 90 tablet 3   glucose blood (ONETOUCH ULTRA) test strip USE TO TEST BLOOD SUGAR ONCE DAILY 300 strip 5   hydrochlorothiazide (HYDRODIURIL) 25 MG tablet Take 1 tablet (25 mg total) by mouth daily. 90 tablet 3   levothyroxine (SYNTHROID) 88 MCG tablet Take 1 tablet (88 mcg total) by mouth daily. 90 tablet 3   metFORMIN (GLUCOPHAGE) 500 MG tablet Take 1 tablet (500 mg total) by mouth 2 (two) times daily. 180 tablet 1   Omega-3 Fatty Acids (FISH OIL) 1000 MG CAPS Take 1 capsule by mouth 2 (two) times daily.     mupirocin ointment (BACTROBAN) 2 % Apply 1 application. topically 3 (three) times daily. 5 g 0   No facility-administered medications prior to visit.    Allergies  Allergen Reactions   Lisinopril Swelling    NOT CLEAR ASSOCIATION    ROS As per HPI  PE:    08/27/2021    8:39 AM 06/02/2021   10:54 AM 05/27/2021    9:51 AM  Vitals with BMI  Height '4\' 11"'$  '4\' 11"'$  '4\' 11"'$   Weight 135 lbs 138 lbs 10 oz 138 lbs 6 oz  BMI 27.25 03.47 42.59  Systolic 563 875 643  Diastolic 78 69 69  Pulse 69 70 73    Physical Exam  Gen: Alert, well appearing.  Patient is oriented to person, place, time, and situation. AFFECT: pleasant, lucid thought and speech. No further exam today.  LABS:  Last CBC Lab Results  Component Value Date   WBC 6.2 10/28/2020   HGB 11.7 10/29/2020   HCT 37.5 10/29/2020   MCV 65.1 (L) 10/29/2020   MCH 20.3 (L) 10/29/2020   RDW 17.1 (H) 10/29/2020   PLT 240.0 10/28/2020   Lab Results  Component Value Date   IRON 137 05/24/2012   TIBC 375 32/95/1884   Last metabolic panel Lab Results  Component Value Date   GLUCOSE 136 (H) 05/27/2021   NA 139 05/27/2021   K 3.7 05/27/2021   CL 97 05/27/2021   CO2 32 05/27/2021   BUN 21 05/27/2021   CREATININE 0.90 05/27/2021   GFRNONAA 81 02/27/2018   CALCIUM 9.8 05/27/2021   PROT 6.8 02/24/2021   ALBUMIN 4.3 02/24/2021    LABGLOB 2.4 02/27/2018   AGRATIO 2.1 02/27/2018   BILITOT 0.5 02/24/2021   ALKPHOS 63 02/24/2021   AST 25 02/24/2021   ALT 16 02/24/2021   Last lipids Lab Results  Component Value Date   CHOL 229 (H) 05/27/2021   HDL 57.50 05/27/2021   LDLCALC 139 (H) 05/27/2021   LDLDIRECT 126.0 01/06/2016   TRIG 162.0 (H) 05/27/2021   CHOLHDL 4 05/27/2021   Last hemoglobin A1c Lab Results  Component Value Date   HGBA1C 6.3 (A) 08/27/2021   HGBA1C 6.3 08/27/2021   HGBA1C 6.3 08/27/2021   HGBA1C 6.3 08/27/2021   Last thyroid functions Lab Results  Component Value Date   TSH 4.05 05/27/2021   POC Hba1c today 6.3%  IMPRESSION AND PLAN:  1) Type 2 diabetes without complication. Good control. POC a1c today 6.3%. Continue metformin 500 mg twice a day.  2) hypertension, well controlled on amlodipine 10 mg a day and HCTZ 25 mg/day. Electrolytes and creatinine have been consistently normal, most recently 3 months ago. Plan repeat of these in 3 months.  #3 adjustment disorder with mixed anxiety and depressed mood. Emotional support and encouragement given.  She does not want to pursue medications at this time.  An After Visit Summary was printed and given to the patient.  FOLLOW UP: Return in about 3 months (around 11/27/2021) for routine chronic illness f/u.  Signed:  Crissie Sickles, MD           08/27/2021

## 2021-10-01 DIAGNOSIS — L738 Other specified follicular disorders: Secondary | ICD-10-CM | POA: Diagnosis not present

## 2021-10-01 DIAGNOSIS — L821 Other seborrheic keratosis: Secondary | ICD-10-CM | POA: Diagnosis not present

## 2021-10-01 DIAGNOSIS — D229 Melanocytic nevi, unspecified: Secondary | ICD-10-CM | POA: Diagnosis not present

## 2021-10-01 DIAGNOSIS — L728 Other follicular cysts of the skin and subcutaneous tissue: Secondary | ICD-10-CM | POA: Diagnosis not present

## 2021-10-13 DIAGNOSIS — H2513 Age-related nuclear cataract, bilateral: Secondary | ICD-10-CM | POA: Diagnosis not present

## 2021-10-13 DIAGNOSIS — Z135 Encounter for screening for eye and ear disorders: Secondary | ICD-10-CM | POA: Diagnosis not present

## 2021-10-13 DIAGNOSIS — H35033 Hypertensive retinopathy, bilateral: Secondary | ICD-10-CM | POA: Diagnosis not present

## 2021-10-13 LAB — HM DIABETES EYE EXAM

## 2021-11-24 ENCOUNTER — Encounter: Payer: Self-pay | Admitting: Family Medicine

## 2021-11-24 ENCOUNTER — Ambulatory Visit (INDEPENDENT_AMBULATORY_CARE_PROVIDER_SITE_OTHER): Payer: Medicare Other | Admitting: Family Medicine

## 2021-11-24 VITALS — BP 120/72 | HR 72 | Temp 98.1°F | Ht 59.0 in | Wt 133.0 lb

## 2021-11-24 DIAGNOSIS — E119 Type 2 diabetes mellitus without complications: Secondary | ICD-10-CM | POA: Diagnosis not present

## 2021-11-24 DIAGNOSIS — E039 Hypothyroidism, unspecified: Secondary | ICD-10-CM | POA: Diagnosis not present

## 2021-11-24 DIAGNOSIS — I10 Essential (primary) hypertension: Secondary | ICD-10-CM | POA: Diagnosis not present

## 2021-11-24 LAB — BASIC METABOLIC PANEL
BUN: 19 mg/dL (ref 6–23)
CO2: 31 mEq/L (ref 19–32)
Calcium: 9.9 mg/dL (ref 8.4–10.5)
Chloride: 97 mEq/L (ref 96–112)
Creatinine, Ser: 0.9 mg/dL (ref 0.40–1.20)
GFR: 61.29 mL/min (ref >60.00)
Glucose, Bld: 126 mg/dL — ABNORMAL HIGH (ref 70–99)
Potassium: 3.6 mEq/L (ref 3.5–5.1)
Sodium: 140 mEq/L (ref 135–145)

## 2021-11-24 LAB — POCT GLYCOSYLATED HEMOGLOBIN (HGB A1C)
HbA1c POC (<> result, manual entry): 6.7 % (ref 4.0–5.6)
HbA1c, POC (controlled diabetic range): 6.7 % (ref 0.0–7.0)
HbA1c, POC (prediabetic range): 6.7 % — AB (ref 5.7–6.4)
Hemoglobin A1C: 6.7 % — AB (ref 4.0–5.6)

## 2021-11-24 LAB — TSH: TSH: 7.65 u[IU]/mL — ABNORMAL HIGH (ref 0.35–5.50)

## 2021-11-24 MED ORDER — AMLODIPINE BESYLATE 10 MG PO TABS: 10.0000 mg | ORAL_TABLET | Freq: Every day | ORAL | 3 refills | Status: DC

## 2021-11-24 MED ORDER — HYDROCHLOROTHIAZIDE 25 MG PO TABS: 25.0000 mg | ORAL_TABLET | Freq: Every day | ORAL | 3 refills | Status: DC

## 2021-11-24 NOTE — Progress Notes (Signed)
OFFICE VISIT  11/24/2021  CC:  Chief Complaint  Patient presents with   Hypertension   Hypothyroidism    Patient is a 76 y.o. female who presents for 43mofollow-up type 2 diabetes, hypertension, and hypothyroidism. A/P as of last visit: "1) Type 2 diabetes without complication. Good control. POC a1c today 6.3%. Continue metformin 500 mg twice a day.   2) hypertension, well controlled on amlodipine 10 mg a day and HCTZ 25 mg/day. Electrolytes and creatinine have been consistently normal, most recently 3 months ago. Plan repeat of these in 3 months.   #3 adjustment disorder with mixed anxiety and depressed mood. Emotional support and encouragement given.  She does not want to pursue medications at this time."  INTERIM HX: [place] she is doing well. She is adjusting to lots of family changes, seems to have a good outlook on things. She is happy to report that she is able to do anything that she wants--has no pain anywhere, has a good appetite, good energy level. She checks fasting glucose every morning and it averages 130.  Only an occasional excursions up over 150.  Nothing lower than 110. She maintains a healthy diet and is active.  Occasional home blood pressure monitoring reveals normal blood pressure.   Past Medical History:  Diagnosis Date   Angioedema 01/2018   Autoimmune urticaria/angioedema-->needs chronic nonsedating antihistamine and an H2 blocker indefinitely.  1 episode ->ASA and ARB d/c'd as precaution.  Then had another episode a couple weeks later.  Another episode just prior to seen allergist--allergist did blood workup/allergy testing->dx of autoimmune urticaria.   Beta thalassemia, heterozygous    vs alpha thalassemia minor (pt's family originally from [place]   CAP (community acquired pneumonia) 02/2010   Colon cancer screening    Pt declines colonoscopy.  Cologuard negative 01/2018-->repeat 3 yrs.   Congenital sensorineural hearing loss    deaf in  right ear; mild presbycusis in left ear (audiologist 2014)   Depression    HTN (hypertension)    Hyperkalemia 07/17/2017   Decreased ARB dose, added low dose hctz, low K diet---repeat K normal.   Hyperlipidemia, mixed    Refuses statins   Hypothyroidism    Need for shingles vaccine    PT DECLINES   Osteopenia    DEXAs 2003-2013--pt has declined bisphosphonates   Somnolence, daytime, controlled 04/22/2012   Type II or unspecified type diabetes mellitus without mention of complication, uncontrolled    Vitamin D deficiency     Past Surgical History:  Procedure Laterality Date   BREAST BIOPSY     Left breast biopsy x 2 (fibrocystic/benign)   COLONOSCOPY  2008   Normal.  10 yr recall (initial colonoscopy done in [place]--needs referral to local GI in 2018.   TONSILLECTOMY AND ADENOIDECTOMY     as a child    Outpatient Medications Prior to Visit  Medication Sig Dispense Refill   Calcium Carbonate-Vitamin D 600-400 MG-UNIT tablet Take 1 tablet by mouth 2 (two) times daily.     [employer] Lancets Ultra-Thin 30G MISC USE TO TEST BLOOD SUGAR ONCE DAILY DX11.9 200 each 3   fexofenadine (ALLEGRA) 180 MG tablet Take 1 tablet (180 mg total) by mouth daily. 90 tablet 1   glucose blood (ONETOUCH ULTRA) test strip USE TO TEST BLOOD SUGAR ONCE DAILY 300 strip 5   levothyroxine (SYNTHROID) 88 MCG tablet Take 1 tablet (88 mcg total) by mouth daily. 90 tablet 3   metFORMIN (GLUCOPHAGE) 500 MG tablet Take 1 tablet (500  mg total) by mouth 2 (two) times daily. 180 tablet 1   Omega-3 Fatty Acids (FISH OIL) 1000 MG CAPS Take 1 capsule by mouth 2 (two) times daily.     amLODipine (NORVASC) 10 MG tablet Take 1 tablet (10 mg total) by mouth daily. 90 tablet 3   hydrochlorothiazide (HYDRODIURIL) 25 MG tablet Take 1 tablet (25 mg total) by mouth daily. 90 tablet 3   No facility-administered medications prior to visit.    Allergies  Allergen Reactions   Lisinopril Swelling    NOT CLEAR ASSOCIATION     ROS As per HPI  PE:    11/24/2021   10:17 AM 08/27/2021    8:39 AM 06/02/2021   10:54 AM  Vitals with BMI  Height '4\' 11"'$  '4\' 11"'$  '4\' 11"'$   Weight 133 lbs 135 lbs 138 lbs 10 oz  BMI 26.85 65.03 54.65  Systolic 681 275 170  Diastolic 72 78 69  Pulse 72 69 70     Physical Exam  Gen: Alert, well appearing.  Patient is oriented to person, place, time, and situation. AFFECT: pleasant, lucid thought and speech. CV: RRR, no m/r/g.   LUNGS: CTA bilat, nonlabored resps, good aeration in all lung fields. EXT: no clubbing or cyanosis.  no edema.    LABS:  Last CBC Lab Results  Component Value Date   WBC 6.2 10/28/2020   HGB 11.7 10/29/2020   HCT 37.5 10/29/2020   MCV 65.1 (L) 10/29/2020   MCH 20.3 (L) 10/29/2020   RDW 17.1 (H) 10/29/2020   PLT 240.0 10/28/2020   Lab Results  Component Value Date   IRON 137 05/24/2012   TIBC 375 01/74/9449   Last metabolic panel Lab Results  Component Value Date   GLUCOSE 136 (H) 05/27/2021   NA 139 05/27/2021   K 3.7 05/27/2021   CL 97 05/27/2021   CO2 32 05/27/2021   BUN 21 05/27/2021   CREATININE 0.90 05/27/2021   GFRNONAA 81 02/27/2018   CALCIUM 9.8 05/27/2021   PROT 6.8 02/24/2021   ALBUMIN 4.3 02/24/2021   LABGLOB 2.4 02/27/2018   AGRATIO 2.1 02/27/2018   BILITOT 0.5 02/24/2021   ALKPHOS 63 02/24/2021   AST 25 02/24/2021   ALT 16 02/24/2021   Last lipids Lab Results  Component Value Date   CHOL 229 (H) 05/27/2021   HDL 57.50 05/27/2021   LDLCALC 139 (H) 05/27/2021   LDLDIRECT 126.0 01/06/2016   TRIG 162.0 (H) 05/27/2021   CHOLHDL 4 05/27/2021   Last hemoglobin A1c Lab Results  Component Value Date   HGBA1C 6.3 (A) 08/27/2021   HGBA1C 6.3 08/27/2021   HGBA1C 6.3 08/27/2021   HGBA1C 6.3 08/27/2021   Last thyroid functions Lab Results  Component Value Date   TSH 4.05 05/27/2021   IMPRESSION AND PLAN:  #1 diabetes without complication.  Good control. POC Hba1c today is 6.7%.  If A1c rises again next check  then we can discuss getting more aggressive with her glycemic medication.  Hypertension, well controlled on amlodipine 10 mg a day and HCTZ 25 mg a day.  3. Hypoth: Takes T4 on empty stomach w/out any other meds. Monitor TSH today.  An After Visit Summary was printed and given to the patient.  FOLLOW UP: Return in about 3 months (around 02/24/2022) for routine chronic illness f/u.  Signed:  Crissie Sickles, MD           11/24/2021

## 2021-11-26 ENCOUNTER — Ambulatory Visit: Payer: Medicare Other | Admitting: Family Medicine

## 2022-01-07 ENCOUNTER — Other Ambulatory Visit: Payer: Self-pay | Admitting: Family Medicine

## 2022-01-07 DIAGNOSIS — E119 Type 2 diabetes mellitus without complications: Secondary | ICD-10-CM

## 2022-01-07 DIAGNOSIS — Z1231 Encounter for screening mammogram for malignant neoplasm of breast: Secondary | ICD-10-CM

## 2022-01-12 ENCOUNTER — Other Ambulatory Visit: Payer: Self-pay | Admitting: Family Medicine

## 2022-01-21 ENCOUNTER — Other Ambulatory Visit: Payer: Self-pay

## 2022-01-21 MED ORDER — LEVOTHYROXINE SODIUM 88 MCG PO TABS: 88.0000 ug | ORAL_TABLET | Freq: Every day | ORAL | 0 refills | Status: DC

## 2022-02-21 ENCOUNTER — Ambulatory Visit
Admission: RE | Admit: 2022-02-21 | Discharge: 2022-02-21 | Disposition: A | Discharge status: Home / Self Care | Payer: Medicare Other | Source: Ambulatory Visit | Attending: Family Medicine | Admitting: Family Medicine

## 2022-02-21 DIAGNOSIS — Z1231 Encounter for screening mammogram for malignant neoplasm of breast: Secondary | ICD-10-CM | POA: Diagnosis not present

## 2022-02-23 ENCOUNTER — Ambulatory Visit: Payer: Medicare Other

## 2022-02-24 ENCOUNTER — Ambulatory Visit (INDEPENDENT_AMBULATORY_CARE_PROVIDER_SITE_OTHER): Payer: Medicare Other | Admitting: Family Medicine

## 2022-02-24 ENCOUNTER — Encounter: Payer: Self-pay | Admitting: Family Medicine

## 2022-02-24 VITALS — BP 112/74 | HR 77 | Temp 98.1°F | Ht 59.0 in | Wt 130.0 lb

## 2022-02-24 DIAGNOSIS — E559 Vitamin D deficiency, unspecified: Secondary | ICD-10-CM | POA: Diagnosis not present

## 2022-02-24 DIAGNOSIS — E538 Deficiency of other specified B group vitamins: Secondary | ICD-10-CM

## 2022-02-24 DIAGNOSIS — E7800 Pure hypercholesterolemia, unspecified: Secondary | ICD-10-CM | POA: Diagnosis not present

## 2022-02-24 DIAGNOSIS — I10 Essential (primary) hypertension: Secondary | ICD-10-CM

## 2022-02-24 DIAGNOSIS — E119 Type 2 diabetes mellitus without complications: Secondary | ICD-10-CM

## 2022-02-24 DIAGNOSIS — Z23 Encounter for immunization: Secondary | ICD-10-CM

## 2022-02-24 DIAGNOSIS — E039 Hypothyroidism, unspecified: Secondary | ICD-10-CM

## 2022-02-24 LAB — VITAMIN B12: Vitamin B-12: 150 pg/mL — ABNORMAL LOW (ref 211–911)

## 2022-02-24 LAB — POCT GLYCOSYLATED HEMOGLOBIN (HGB A1C)
HbA1c POC (<> result, manual entry): 6.4 % (ref 4.0–5.6)
HbA1c, POC (controlled diabetic range): 6.4 % (ref 0.0–7.0)
HbA1c, POC (prediabetic range): 6.4 % (ref 5.7–6.4)
Hemoglobin A1C: 6.4 % — AB (ref 4.0–5.6)

## 2022-02-24 LAB — TSH: TSH: 0.14 u[IU]/mL — ABNORMAL LOW (ref 0.35–5.50)

## 2022-02-24 LAB — LIPID PANEL
Cholesterol: 210 mg/dL — ABNORMAL HIGH (ref 0–200)
HDL: 46.9 mg/dL (ref >39.00)
NonHDL: 162.81
Total CHOL/HDL Ratio: 4
Triglycerides: 244 mg/dL — ABNORMAL HIGH (ref 0.0–149.0)
VLDL: 48.8 mg/dL — ABNORMAL HIGH (ref 0.0–40.0)

## 2022-02-24 LAB — BASIC METABOLIC PANEL
BUN: 18 mg/dL (ref 6–23)
CO2: 34 mEq/L — ABNORMAL HIGH (ref 19–32)
Calcium: 9.4 mg/dL (ref 8.4–10.5)
Chloride: 98 mEq/L (ref 96–112)
Creatinine, Ser: 0.74 mg/dL (ref 0.40–1.20)
GFR: 77.38 mL/min (ref >60.00)
Glucose, Bld: 116 mg/dL — ABNORMAL HIGH (ref 70–99)
Potassium: 3.9 mEq/L (ref 3.5–5.1)
Sodium: 140 mEq/L (ref 135–145)

## 2022-02-24 LAB — VITAMIN D 25 HYDROXY (VIT D DEFICIENCY, FRACTURES): VITD: 34.11 ng/mL (ref 30.00–100.00)

## 2022-02-24 LAB — LDL CHOLESTEROL, DIRECT: Direct LDL: 133 mg/dL

## 2022-02-24 MED ORDER — FEXOFENADINE HCL 180 MG PO TABS: 180.0000 mg | ORAL_TABLET | Freq: Every day | ORAL | 1 refills | Status: AC

## 2022-02-24 MED ORDER — METFORMIN HCL 500 MG PO TABS: 500.0000 mg | ORAL_TABLET | Freq: Two times a day (BID) | ORAL | 1 refills | Status: DC

## 2022-02-24 NOTE — Progress Notes (Signed)
OFFICE VISIT  02/24/2022  CC:  Chief Complaint  Patient presents with   Diabetes    Pt is fasting    Patient is a 76 y.o. female who presents for 91-monthfollow-up diabetes, hypertension, and hypothyroidism. A/P as of last visit: "#1 diabetes without complication.  Good control. POC Hba1c today is 6.7%.  If A1c rises again next check then we can discuss getting more aggressive with her glycemic medication.   Hypertension, well controlled on amlodipine 10 mg a day and HCTZ 25 mg a day.   3. Hypoth: Takes T4 on empty stomach w/out any other meds. Monitor TSH today."  INTERIM HX: Feeling well. No significant hypoglycemia or hyperglycemia. Diet is good. No home blood pressure monitoring.  TSH was slightly elevated last visit so I adjusted her dose: Take 1 and 1/2 of the 88 mcg tabs 2 days a week and take 1 tab all other days.  ROS as above, plus--> no fevers, no CP, no SOB, no wheezing, no cough, no dizziness, no HAs, no rashes, no melena/hematochezia.  No polyuria or polydipsia.  No myalgias or arthralgias.  No focal weakness, paresthesias, or tremors.  No acute vision or hearing abnormalities.  No dysuria or unusual/new urinary urgency or frequency.  No recent changes in lower legs. No n/v/d or abd pain.  No palpitations.    Past Medical History:  Diagnosis Date   Angioedema 01/2018   Autoimmune urticaria/angioedema-->needs chronic nonsedating antihistamine and an H2 blocker indefinitely.  1 episode ->ASA and ARB d/c'd as precaution.  Then had another episode a couple weeks later.  Another episode just prior to seen allergist--allergist did blood workup/allergy testing->dx of autoimmune urticaria.   Beta thalassemia, heterozygous    vs alpha thalassemia minor (pt's family originally from [place].   CAP (community acquired pneumonia) 02/2010   Colon cancer screening    Pt declines colonoscopy.  Cologuard negative 01/2018 and 03/2021.   Congenital sensorineural hearing loss     deaf in right ear; mild presbycusis in left ear (audiologist 2014)   Depression    HTN (hypertension)    Hyperkalemia 07/17/2017   Decreased ARB dose, added low dose hctz, low K diet---repeat K normal.   Hyperlipidemia, mixed    Refuses statins   Hypothyroidism    Need for shingles vaccine    PT DECLINES   Osteopenia    DEXAs 2003-2013--pt has declined bisphosphonates   Somnolence, daytime, controlled 04/22/2012   Type II or unspecified type diabetes mellitus without mention of complication, uncontrolled    Vitamin D deficiency     Past Surgical History:  Procedure Laterality Date   BREAST BIOPSY     Left breast biopsy x 2 (fibrocystic/benign)   COLONOSCOPY  2008   Normal.  10 yr recall (initial colonoscopy done in [place]--needs referral to local GI in 2018.   TONSILLECTOMY AND ADENOIDECTOMY     as a child    Outpatient Medications Prior to Visit  Medication Sig Dispense Refill   amLODipine (NORVASC) 10 MG tablet Take 1 tablet (10 mg total) by mouth daily. 90 tablet 3   Calcium Carbonate-Vitamin D 600-400 MG-UNIT tablet Take 1 tablet by mouth 2 (two) times daily.     [employer] Lancets Ultra-Thin 30G MISC USE TO TEST BLOOD SUGAR ONCE DAILY DX11.9 200 each 3   glucose blood (ONETOUCH ULTRA) test strip USE TO TEST BLOOD SUGAR ONCE DAILY 100 strip 2   hydrochlorothiazide (HYDRODIURIL) 25 MG tablet Take 1 tablet (25 mg total) by mouth daily.  90 tablet 3   levothyroxine (SYNTHROID) 88 MCG tablet Take 1 tablet (88 mcg total) by mouth daily. (Patient taking differently: Take 88 mcg by mouth daily. Take add'l half a tab two days a week) 90 tablet 0   Omega-3 Fatty Acids (FISH OIL) 1000 MG CAPS Take 1 capsule by mouth 2 (two) times daily.     fexofenadine (ALLEGRA) 180 MG tablet TAKE 1 TABLET BY MOUTH EVERY DAY 90 tablet 1   metFORMIN (GLUCOPHAGE) 500 MG tablet Take 1 tablet (500 mg total) by mouth 2 (two) times daily. 180 tablet 1   No facility-administered medications prior to visit.     Allergies  Allergen Reactions   Lisinopril Swelling    NOT CLEAR ASSOCIATION    ROS As per HPI  PE:    02/24/2022    9:39 AM 11/24/2021   10:17 AM 08/27/2021    8:39 AM  Vitals with BMI  Height '4\' 11"'$  '4\' 11"'$  '4\' 11"'$   Weight 130 lbs 133 lbs 135 lbs  BMI 26.24 64.33 29.51  Systolic 884 166 063  Diastolic 74 72 78  Pulse 77 72 69   Physical Exam  Gen: Alert, well appearing.  Patient is oriented to person, place, time, and situation. AFFECT: pleasant, lucid thought and speech. CV: RRR, no m/r/g.   LUNGS: CTA bilat, nonlabored resps, good aeration in all lung fields. EXT: no clubbing or cyanosis.  no edema.    LABS:  Last CBC Lab Results  Component Value Date   WBC 6.2 10/28/2020   HGB 11.7 10/29/2020   HCT 37.5 10/29/2020   MCV 65.1 (L) 10/29/2020   MCH 20.3 (L) 10/29/2020   RDW 17.1 (H) 10/29/2020   PLT 240.0 01/60/1093   Last metabolic panel Lab Results  Component Value Date   GLUCOSE 126 (H) 11/24/2021   NA 140 11/24/2021   K 3.6 11/24/2021   CL 97 11/24/2021   CO2 31 11/24/2021   BUN 19 11/24/2021   CREATININE 0.90 11/24/2021   GFRNONAA 81 02/27/2018   CALCIUM 9.9 11/24/2021   PROT 6.8 02/24/2021   ALBUMIN 4.3 02/24/2021   LABGLOB 2.4 02/27/2018   AGRATIO 2.1 02/27/2018   BILITOT 0.5 02/24/2021   ALKPHOS 63 02/24/2021   AST 25 02/24/2021   ALT 16 02/24/2021   Last lipids Lab Results  Component Value Date   CHOL 229 (H) 05/27/2021   HDL 57.50 05/27/2021   LDLCALC 139 (H) 05/27/2021   LDLDIRECT 126.0 01/06/2016   TRIG 162.0 (H) 05/27/2021   CHOLHDL 4 05/27/2021   Last hemoglobin A1c Lab Results  Component Value Date   HGBA1C 6.4 (A) 02/24/2022   HGBA1C 6.4 02/24/2022   HGBA1C 6.4 02/24/2022   HGBA1C 6.4 02/24/2022   Last thyroid functions Lab Results  Component Value Date   TSH 7.65 (H) 11/24/2021   IMPRESSION AND PLAN:  #1 diabetes without complication.  Good control. POC Hba1c today is 6.4%. Continue metformin 500 mg twice  daily. Has been on metformin long-term--we will check vitamin B12 level due to potential for malabsorption on this medication.  2.  Essential hypertension, well controlled on amlodipine 10 mg a day and HCTZ 25 mg a day.  #3 hyperlipidemia.  She has declined medication in the past. Lipid panel today.  Hypothyroidism. Recent mild TSH elevation 3 months ago, dose adjusted accordingly. TSH today.  5.  Preventative health care: Mammogram up-to-date this month, normal. Colon cancer screening up-to-date: Cologuard - 03/2021. Vaccines:  Flu vaccine today. Tdap->declined. she  declines Shingrix.  Otherwise all up-to-date.  An After Visit Summary was printed and given to the patient.  FOLLOW UP: Return in about 3 months (around 05/26/2022) for routine chronic illness f/u.  Signed:  Crissie Sickles, MD           02/24/2022

## 2022-02-26 ENCOUNTER — Other Ambulatory Visit: Payer: Self-pay | Admitting: Family Medicine

## 2022-02-26 DIAGNOSIS — E119 Type 2 diabetes mellitus without complications: Secondary | ICD-10-CM

## 2022-03-02 ENCOUNTER — Ambulatory Visit (INDEPENDENT_AMBULATORY_CARE_PROVIDER_SITE_OTHER): Payer: Medicare Other

## 2022-03-02 DIAGNOSIS — Z0000 Encounter for general adult medical examination without abnormal findings: Secondary | ICD-10-CM

## 2022-03-02 NOTE — Progress Notes (Signed)
Subjective:   Mackenzie Wood is a 76 y.o. female who presents for Medicare Annual (Subsequent) preventive examination.  I connected with  Mackenzie Wood on 03/02/22 by an audio only telemedicine application and verified that I am speaking with the correct person using two identifiers.   I discussed the limitations, risks, security and privacy concerns of performing an evaluation and management service by telephone and the availability of in person appointments. I also discussed with the patient that there may be a patient responsible charge related to this service. The patient expressed understanding and verbally consented to this telephonic visit.  Location of Patient: home Location of Provider: office  List any persons and their role that are participating in the visit with the patient.   [place], CMA  Review of Systems    Defer to PCP Cardiac Risk Factors include: advanced age (>23mn, >>58women);hypertension;diabetes mellitus;dyslipidemia     Objective:    There were no vitals filed for this visit. There is no height or weight on file to calculate BMI.     03/02/2022    4:11 PM 02/10/2021   11:50 AM 02/05/2020    9:50 AM 01/13/2017   11:25 AM 06/21/2014    8:15 AM  Advanced Directives  Does Patient Have a Medical Advance Directive? Yes Yes Yes Yes No  Type of Advance Directive Living will;Healthcare Power of ALeakeLiving will HVinitaLiving will HHartLiving will   Does patient want to make changes to medical advance directive? No - Patient declined      Copy of [place] Chart? No - copy requested No - copy requested No - copy requested No - copy requested   Would patient like information on creating a medical advance directive?     No - patient declined information    Current Medications (verified) Outpatient Encounter Medications as of  03/02/2022  Medication Sig   amLODipine (NORVASC) 10 MG tablet Take 1 tablet (10 mg total) by mouth daily.   Calcium Carbonate-Vitamin D 600-400 MG-UNIT tablet Take 1 tablet by mouth 2 (two) times daily.   [employer] Lancets Ultra-Thin 30G MISC USE TO TEST BLOOD SUGAR ONCE DAILY DX11.9   fexofenadine (ALLEGRA) 180 MG tablet Take 1 tablet (180 mg total) by mouth daily.   glucose blood (ONETOUCH ULTRA) test strip USE TO TEST BLOOD SUGAR ONCE DAILY   hydrochlorothiazide (HYDRODIURIL) 25 MG tablet Take 1 tablet (25 mg total) by mouth daily.   levothyroxine (SYNTHROID) 88 MCG tablet Take 1 tablet (88 mcg total) by mouth daily. (Patient taking differently: Take 88 mcg by mouth daily. Take add'l half a tab two days a week)   metFORMIN (GLUCOPHAGE) 500 MG tablet Take 1 tablet (500 mg total) by mouth 2 (two) times daily.   Omega-3 Fatty Acids (FISH OIL) 1000 MG CAPS Take 1 capsule by mouth 2 (two) times daily.   No facility-administered encounter medications on file as of 03/02/2022.    Allergies (verified) Lisinopril   History: Past Medical History:  Diagnosis Date   Angioedema 01/2018   Autoimmune urticaria/angioedema-->needs chronic nonsedating antihistamine and an H2 blocker indefinitely.  1 episode ->ASA and ARB d/c'd as precaution.  Then had another episode a couple weeks later.  Another episode just prior to seen allergist--allergist did blood workup/allergy testing->dx of autoimmune urticaria.   Beta thalassemia, heterozygous    vs alpha thalassemia minor (pt's family [place]  CAP (community acquired pneumonia) 02/2010   Colon cancer screening    Pt declines colonoscopy.  Cologuard negative 01/2018 and 03/2021.   Congenital sensorineural hearing loss    deaf in right ear; mild presbycusis in left ear (audiologist 2014)   Depression    HTN (hypertension)    Hyperkalemia 07/17/2017   Decreased ARB dose, added low dose hctz, low K diet---repeat K normal.   Hyperlipidemia, mixed     Refuses statins   Hypothyroidism    Need for shingles vaccine    PT DECLINES   Osteopenia    DEXAs 2003-2013--pt has declined bisphosphonates   Somnolence, daytime, controlled 04/22/2012   Type II or unspecified type diabetes mellitus without mention of complication, uncontrolled    Vitamin D deficiency    Past Surgical History:  Procedure Laterality Date   BREAST BIOPSY     Left breast biopsy x 2 (fibrocystic/benign)   COLONOSCOPY  2008   Normal.  10 yr recall (initial colonoscopy done in [place]--needs referral to local GI in 2018.   TONSILLECTOMY AND ADENOIDECTOMY     as a child   Family History  Problem Relation Age of Onset   Hypertension Mother    Hypertension Father    Social History   Socioeconomic History   Marital status: Married    Spouse name: Not on file   Number of children: Not on file   Years of education: Not on file   Highest education level: Not on file  Occupational History   Not on file  Tobacco Use   Smoking status: Never   Smokeless tobacco: Never  Vaping Use   Vaping Use: Never used  Substance and Sexual Activity   Alcohol use: No   Drug use: No   Sexual activity: Not on file  Other Topics Concern   Not on file  Social History Narrative   Married, 2 daughters, 3 grandchildren.   Retired Archivist.  Relocated from [place] around 2010.   No T/A/Ds.  Exercise: sedentary life + no formal exercise at all.   Diet: regular            Social Determinants of Health   Financial Resource Strain: Low Risk  (03/02/2022)   Overall Financial Resource Strain (CARDIA)    Difficulty of Paying Living Expenses: Not hard at all  Food Insecurity: No Food Insecurity (03/02/2022)   Hunger Vital Sign    Worried About Running Out of Food in the Last Year: Never true    Ran Out of Food in the Last Year: Never true  Transportation Needs: No Transportation Needs (03/02/2022)   PRAPARE - Hydrologist (Medical): No     Lack of Transportation (Non-Medical): No  Physical Activity: Insufficiently Active (03/02/2022)   Exercise Vital Sign    Days of Exercise per Week: 7 days    Minutes of Exercise per Session: 20 min  Stress: No Stress Concern Present (03/02/2022)   [place]    Feeling of Stress : Not at all  Social Connections: [place] (03/02/2022)   Social Connection and Isolation Panel [NHANES]    Frequency of Communication with Friends and Family: More than three times a week    Frequency of Social Gatherings with Friends and Family: More than three times a week    Attends Religious Services: More than 4 times per year    Active Member of [employer] or Organizations: Not on file  Attends Music therapist: More than 4 times per year    Marital Status: Married    Tobacco Counseling Counseling given: Not Answered   Clinical Intake:  Pre-visit preparation completed: No  Pain : No/denies pain     Diabetes: Yes CBG done?: No Did pt. bring in CBG monitor from home?: No  How often do you need to have someone help you when you read instructions, pamphlets, or other written materials from your doctor or pharmacy?: 1 - Never What is the last grade level you completed in school?: associates  Diabetic?yes  Interpreter Needed?: No      Activities of Daily Living    03/02/2022    4:12 PM  In your present state of health, do you have any difficulty performing the following activities:  Hearing? 1  Vision? 1  Difficulty concentrating or making decisions? 0  Walking or climbing stairs? 0  Dressing or bathing? 0  Doing errands, shopping? 0  Preparing Food and eating ? N  Using the Toilet? N  In the past six months, have you accidently leaked urine? Y  Do you have problems with loss of bowel control? N  Managing your Medications? N  Managing your Finances? N  Housekeeping or managing your Housekeeping? N     Patient Care Team: Tammi Sou, MD as PCP - General (Family Medicine) Melida Quitter, MD as Consulting Physician (Otolaryngology) Bobbitt, [place] Muta, MD as Consulting Physician (Allergy and Immunology)  Indicate any recent Medical Services you may have received from other than [facility] providers in the past year (date may be approximate).     Assessment:   This is a routine wellness examination for Mackenzie Wood.  Hearing/Vision screen No results found.  Dietary issues and exercise activities discussed: Current Exercise Habits: Home exercise routine, Type of exercise: walking, Time (Minutes): 20, Frequency (Times/Week): 7, Weekly Exercise (Minutes/Week): 140, Intensity: Mild   Goals Addressed             This Visit's Progress    Quality of Life Maintained       Evidence-based guidance:  Assess patient's thoughts about quality of life, goals and expectations, and dissatisfaction or desire to improve.  Identify issues of primary importance such as mental health, illness, exercise tolerance, pain, sexual function and intimacy, cognitive change, social isolation, finances and relationships.  Assess and monitor for signs/symptoms of psychosocial concerns, especially depression or ideations regarding harm to others or self; provide or refer for mental health services as needed.  Identify sensory issues that impact quality of life such as hearing loss, vision deficit; strategize ways to maintain or improve hearing, vision.  Promote access to services in the community to support independence such as support groups, home visiting programs, financial assistance, handicapped parking tags, durable medical equipment and emergency responder.  Promote activities to decrease social isolation such as group support or social, leisure and recreational activities, employment, use of social media; consider safety concerns about being out of home for activities.  Provide patient an opportunity to share  by storytelling or a "life review" to give positive meaning to life and to assist with coping and negative experiences.  Encourage patient to tap into hope to improve sense of self.  Counsel based on prognosis and as early as possible about end-of-life and palliative care; consider referral to palliative care provider.  Advocate for the development of palliative care plan that may include avoidance of unnecessary testing and intervention, symptom control, discontinuation of medications, hospice and organ  donation.  Counsel as early as possible those with life-limiting chronic disease about palliative care; consider referral to palliative care provider.  Advocate for the development of palliative care plan.   Notes:       Depression Screen    03/02/2022    4:07 PM 08/27/2021    8:42 AM 02/10/2021   11:49 AM 02/05/2020    9:52 AM 05/29/2018   10:41 AM 12/05/2017    9:45 AM 01/13/2017   11:26 AM  PHQ 2/9 Scores  PHQ - 2 Score 0 1 0 2 0 0   PHQ- 9 Score    2 2    Exception Documentation       Patient refusal    Fall Risk    03/02/2022    4:12 PM 02/24/2022    9:42 AM 08/27/2021    8:42 AM 02/10/2021   11:52 AM 02/05/2020    9:52 AM  Fall Risk   Falls in the past year? 0 0 0 0 0  Number falls in past yr: 0 0 0 0 0  Injury with Fall? 0 0 0 0 0  Risk for fall due to :  No Fall Risks Impaired vision Impaired vision   Follow up Falls evaluation completed Falls evaluation completed Falls evaluation completed Falls prevention discussed Falls prevention discussed    FALL RISK PREVENTION PERTAINING TO THE HOME:  Any stairs in or around the home? Yes  If so, are there any without handrails? No  Home free of loose throw rugs in walkways, pet beds, electrical cords, etc? Yes  Adequate lighting in your home to reduce risk of falls? Yes   ASSISTIVE DEVICES UTILIZED TO PREVENT FALLS:  Life alert? No  Use of a cane, walker or w/c? No  Grab bars in the bathroom? Yes  Shower chair or bench in  shower? No  Elevated toilet seat or a handicapped toilet? No   TIMED UP AND GO:  Was the test performed? No .  Length of time to ambulate 10 feet: n/a sec.     Cognitive Function:        03/02/2022    4:13 PM 02/10/2021   11:54 AM  6CIT Screen  What Year? 4 points 0 points  What month? 0 points 0 points  What time? 0 points 0 points  Count back from 20 0 points 0 points  Months in reverse 0 points 0 points  Repeat phrase 0 points 0 points  Total Score 4 points 0 points    Immunizations Immunization History  Administered Date(s) Administered   Fluad Quad(high Dose 65+) 01/16/2019, 03/17/2020, 02/24/2021, 02/24/2022   Influenza Split 03/26/2010, 12/27/2011   Influenza, High Dose Seasonal PF 01/02/2015, 01/08/2016, 01/13/2017   Influenza,inj,Quad PF,6+ Mos 03/04/2013, 03/03/2014   Influenza-Unspecified 03/25/2011, 01/26/2018   PFIZER(Purple Top)SARS-COV-2 Vaccination 05/12/2019, 06/04/2019, 02/26/2020   PPD Test 03/05/2008   Pneumococcal Conjugate-13 09/08/2015   Pneumococcal Polysaccharide-23 07/07/2014   Pneumococcal-Unspecified 03/26/2009   Tdap 03/28/2006   Zoster, Live 03/26/2009    TDAP status: Due, Education has been provided regarding the importance of this vaccine. Advised may receive this vaccine at local pharmacy or Health Dept. Aware to provide a copy of the vaccination record if obtained from local pharmacy or Health Dept. Verbalized acceptance and understanding.  Flu Vaccine status: Up to date  Pneumococcal vaccine status: Up to date  Covid-19 vaccine status: Completed vaccines  Qualifies for Shingles Vaccine? Yes   Zostavax completed No   Shingrix Completed?: No.  Education has been provided regarding the importance of this vaccine. Patient has been advised to call insurance company to determine out of pocket expense if they have not yet received this vaccine. Advised may also receive vaccine at local pharmacy or Health Dept. Verbalized acceptance  and understanding.  Screening Tests Health Maintenance  Topic Date Due   DTaP/Tdap/Td (2 - Td or Tdap) 03/28/2016   COVID-19 Vaccine (4 - 2023-24 season) 11/26/2021   OPHTHALMOLOGY EXAM  12/16/2021   Zoster Vaccines- Shingrix (1 of 2) 06/01/2022 (Originally 07/11/1995)   Diabetic kidney evaluation - Urine ACR  05/28/2022   FOOT EXAM  05/28/2022   HEMOGLOBIN A1C  08/25/2022   MAMMOGRAM  02/22/2023   Diabetic kidney evaluation - GFR measurement  02/25/2023   Medicare Annual Wellness (AWV)  03/03/2023   Pneumonia Vaccine 46+ Years old  Completed   INFLUENZA VACCINE  Completed   DEXA SCAN  Completed   HPV VACCINES  Aged Out   Hepatitis C Screening  Discontinued   Fecal DNA (Cologuard)  Discontinued    Health Maintenance  Health Maintenance Due  Topic Date Due   DTaP/Tdap/Td (2 - Td or Tdap) 03/28/2016   COVID-19 Vaccine (4 - 2023-24 season) 11/26/2021   OPHTHALMOLOGY EXAM  12/16/2021    Colorectal cancer screening: Type of screening: Cologuard. Completed 04/19/21. Repeat every 3 years  Mammogram status: No longer required due to age.  Bone Density status: Completed 05/09/2011. Results reflect: Bone density results: OSTEOPENIA. Repeat every 2 years.  Lung Cancer Screening: (Low Dose CT Chest recommended if Age 29-80 years, 30 pack-year currently smoking OR have quit w/in 15years.) does not qualify.   Lung Cancer Screening Referral: n/a  Additional Screening:  Hepatitis C Screening: does qualify; Completed n/a  Vision Screening: Recommended annual ophthalmology exams for early detection of glaucoma and other disorders of the eye. Is the patient up to date with their annual eye exam?  Yes  Who is the provider or what is the name of the office in which the patient attends annual eye exams? Dr. Ria Comment If pt is not established with a provider, would they like to be referred to a provider to establish care? No .   Dental Screening: Recommended annual dental exams for proper  oral hygiene  Community Resource Referral / Chronic Care Management: CRR required this visit?  No   CCM required this visit?  No      Plan:     I have personally reviewed and noted the following in the patient's chart:   Medical and social history Use of alcohol, tobacco or illicit drugs  Current medications and supplements including opioid prescriptions. Patient is not currently taking opioid prescriptions. Functional ability and status Nutritional status Physical activity Advanced directives List of other physicians Hospitalizations, surgeries, and ER visits in previous 12 months Vitals Screenings to include cognitive, depression, and falls Referrals and appointments  In addition, I have reviewed and discussed with patient certain preventive protocols, quality metrics, and best practice recommendations. A written personalized care plan for preventive services as well as general preventive health recommendations were provided to patient.     Beatrix Fetters, [place]   03/02/2022   Nurse Notes: Non-Face to Face or Face to Face 12 minute visit Encounter   Mackenzie Wood , Thank you for taking time to come for your Medicare Wellness Visit. I appreciate your ongoing commitment to your health goals. Please review the following plan we discussed and let me know if I can assist  you in the future.   These are the goals we discussed:  Goals      Patient Stated     Continue drinking water, being active & eating healthy     Patient Stated     None at this time     Quality of Life Maintained     Evidence-based guidance:  Assess patient's thoughts about quality of life, goals and expectations, and dissatisfaction or desire to improve.  Identify issues of primary importance such as mental health, illness, exercise tolerance, pain, sexual function and intimacy, cognitive change, social isolation, finances and relationships.  Assess and monitor for signs/symptoms of psychosocial  concerns, especially depression or ideations regarding harm to others or self; provide or refer for mental health services as needed.  Identify sensory issues that impact quality of life such as hearing loss, vision deficit; strategize ways to maintain or improve hearing, vision.  Promote access to services in the community to support independence such as support groups, home visiting programs, financial assistance, handicapped parking tags, durable medical equipment and emergency responder.  Promote activities to decrease social isolation such as group support or social, leisure and recreational activities, employment, use of social media; consider safety concerns about being out of home for activities.  Provide patient an opportunity to share by storytelling or a "life review" to give positive meaning to life and to assist with coping and negative experiences.  Encourage patient to tap into hope to improve sense of self.  Counsel based on prognosis and as early as possible about end-of-life and palliative care; consider referral to palliative care provider.  Advocate for the development of palliative care plan that may include avoidance of unnecessary testing and intervention, symptom control, discontinuation of medications, hospice and organ donation.  Counsel as early as possible those with life-limiting chronic disease about palliative care; consider referral to palliative care provider.  Advocate for the development of palliative care plan.   Notes:         This is a list of the screening recommended for you and due dates:  Health Maintenance  Topic Date Due   DTaP/Tdap/Td vaccine (2 - Td or Tdap) 03/28/2016   COVID-19 Vaccine (4 - 2023-24 season) 11/26/2021   Eye exam for diabetics  12/16/2021   Zoster (Shingles) Vaccine (1 of 2) 06/01/2022*   Yearly kidney health urinalysis for diabetes  05/28/2022   Complete foot exam   05/28/2022   Hemoglobin A1C  08/25/2022   Mammogram  02/22/2023    Yearly kidney function blood test for diabetes  02/25/2023   Medicare Annual Wellness Visit  03/03/2023   Pneumonia Vaccine  Completed   Flu Shot  Completed   DEXA scan (bone density measurement)  Completed   HPV Vaccine  Aged Out   Hepatitis C Screening: USPSTF Recommendation to screen - Ages 22-79 yo.  Discontinued   Cologuard (Stool DNA test)  Discontinued  *Topic was postponed. The date shown is not the original due date.

## 2022-03-02 NOTE — Patient Instructions (Signed)

## 2022-03-30 IMAGING — MG DIGITAL SCREENING BILAT W/ TOMO W/ CAD
8 series · 8 of 24 positions shown · non-contrast
Comparison: Previous exam(s).

CLINICAL DATA: Screening.

EXAM:
DIGITAL SCREENING BILATERAL MAMMOGRAM WITH TOMO AND CAD

[R CC synth-2D]
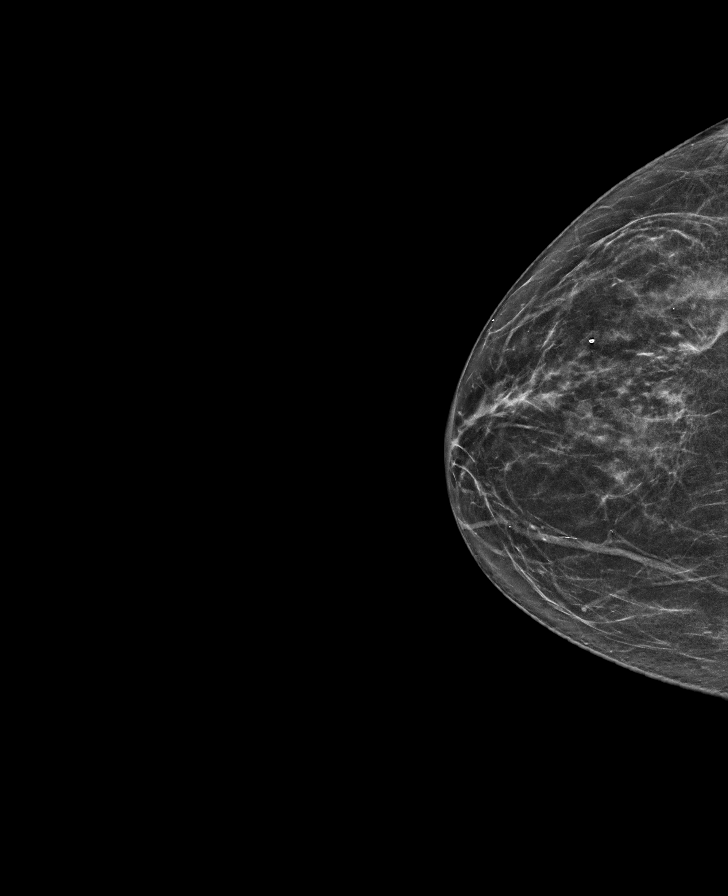

[L CC synth-2D]
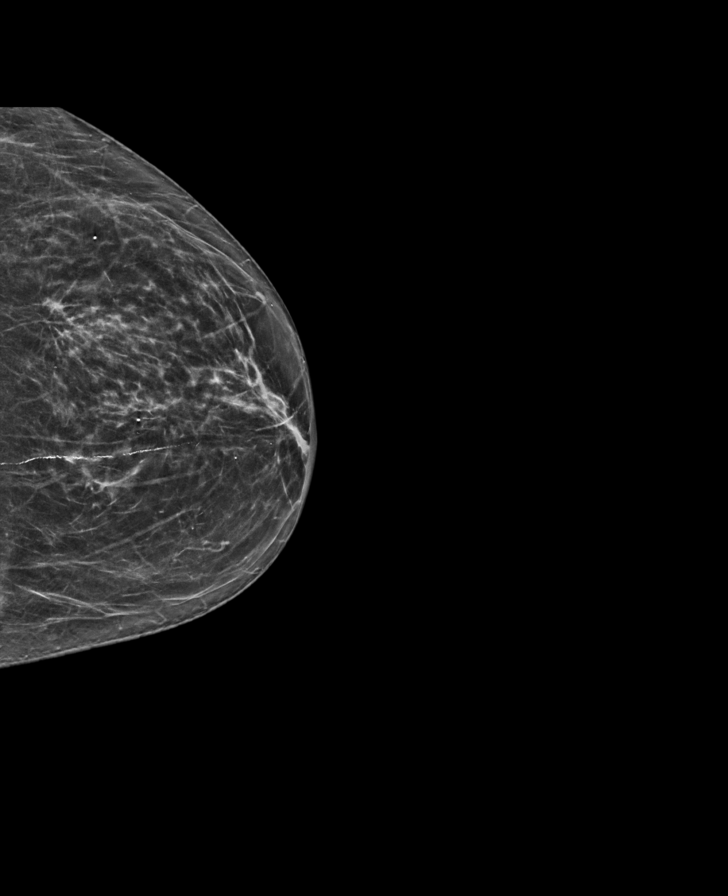

[L MLO synth-2D]
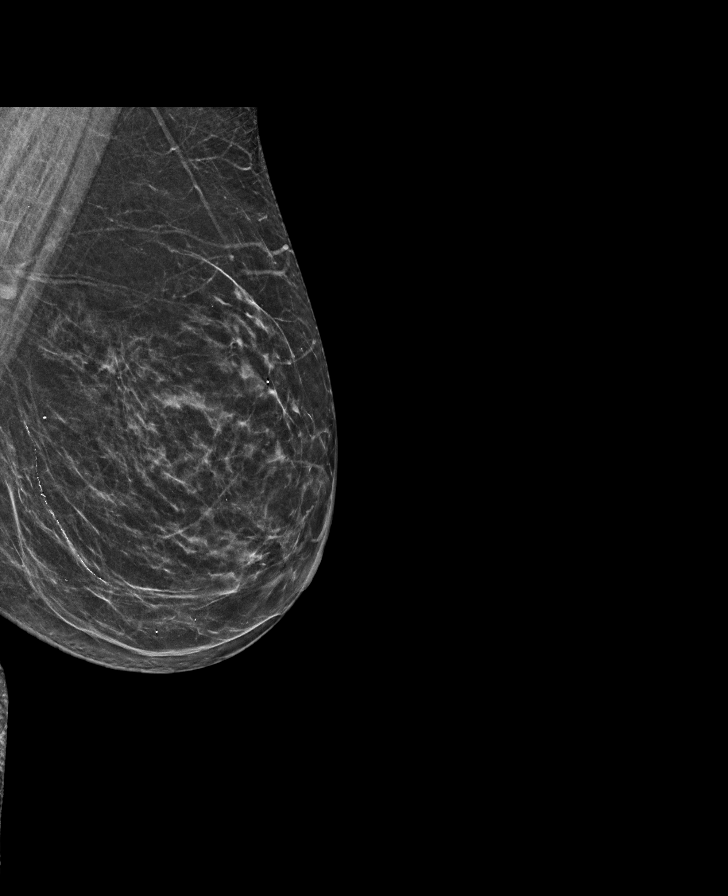

[R MLO synth-2D]
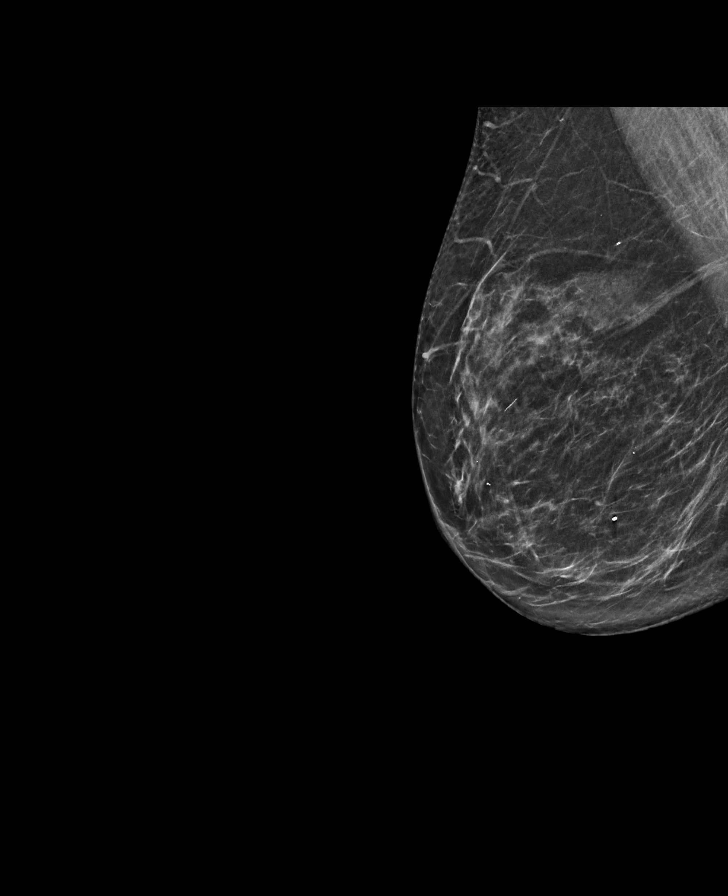

[R CC tomo · tomo slice 30/59.0]
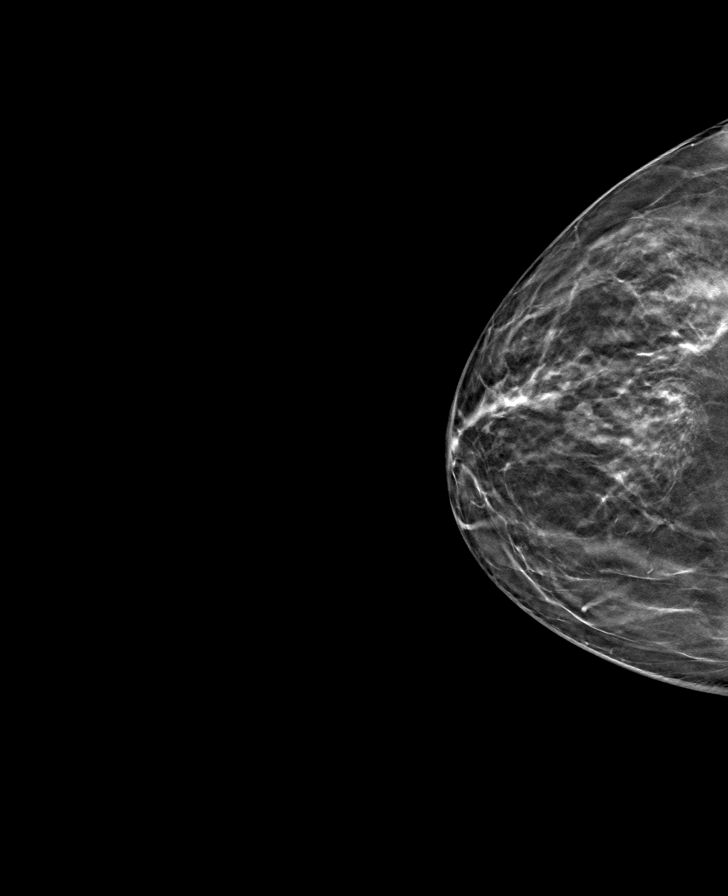

[L MLO tomo · tomo slice 31/61.0]
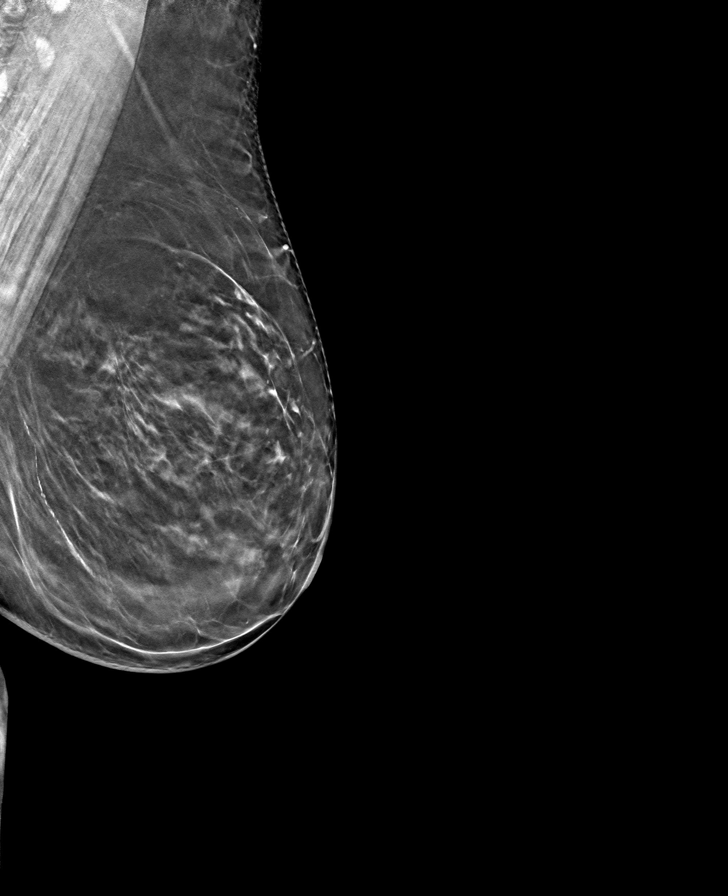

[L CC tomo · tomo slice 31/60.0]
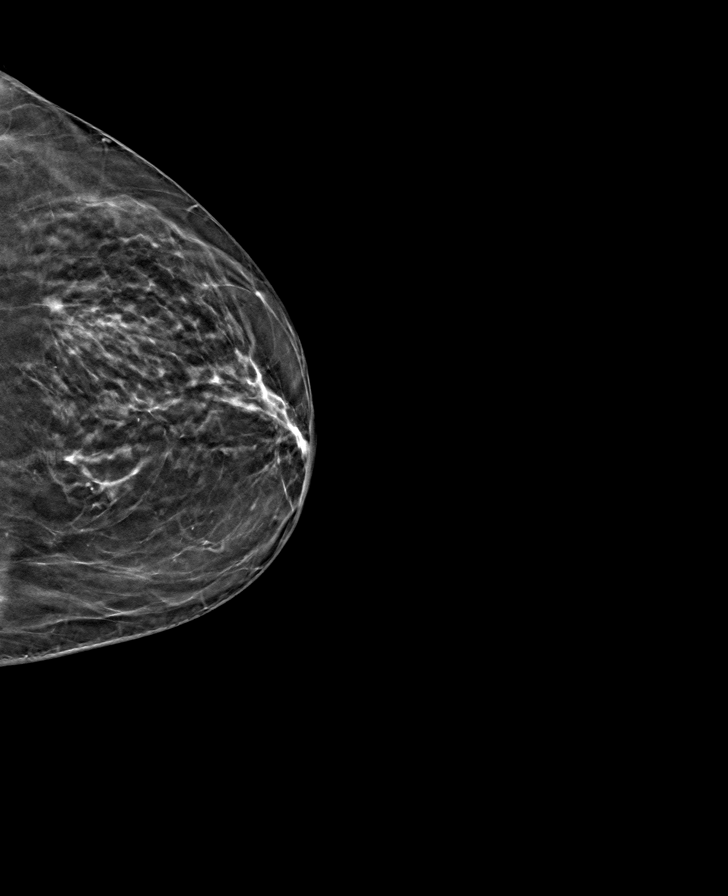

[R MLO tomo · tomo slice 31/61.0]
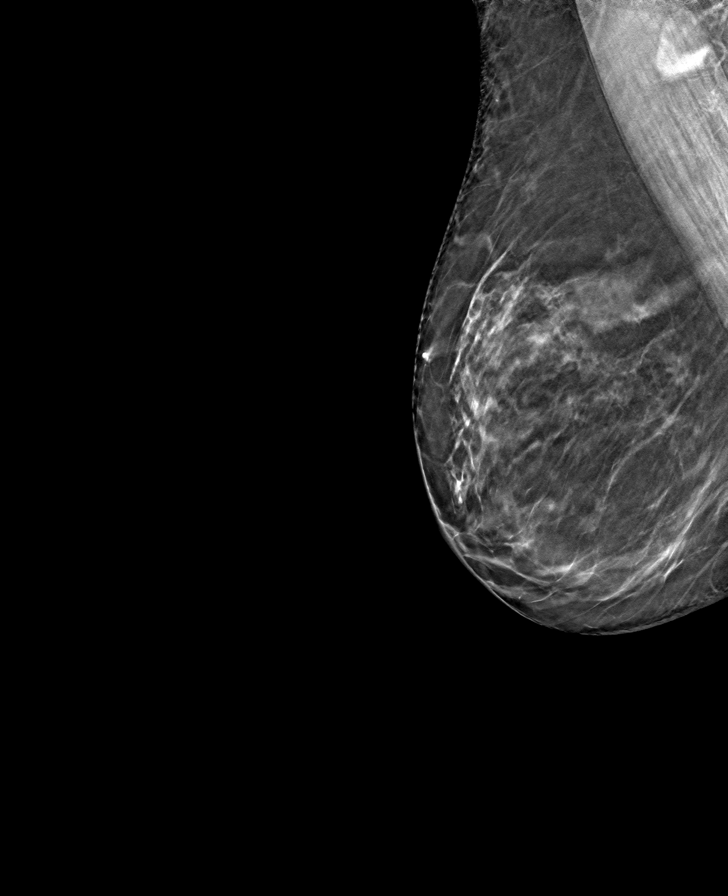

[8 of 24 positions shown; findings below may reference images not displayed]

ACR Breast Density Category b: There are scattered areas of
fibroglandular density.
FINDINGS: There are no findings suspicious for malignancy. Images were
processed with CAD.
IMPRESSION: No mammographic evidence of malignancy. A result letter of this
screening mammogram will be mailed directly to the patient.

RECOMMENDATION:
Screening mammogram in one year. (Code:[id number])

BI-RADS CATEGORY  1: Negative.

## 2022-03-31 DIAGNOSIS — H6123 Impacted cerumen, bilateral: Secondary | ICD-10-CM | POA: Diagnosis not present

## 2022-03-31 DIAGNOSIS — H903 Sensorineural hearing loss, bilateral: Secondary | ICD-10-CM | POA: Diagnosis not present

## 2022-03-31 DIAGNOSIS — Z974 Presence of external hearing-aid: Secondary | ICD-10-CM | POA: Diagnosis not present

## 2022-05-07 ENCOUNTER — Other Ambulatory Visit: Payer: Self-pay | Admitting: Family Medicine

## 2022-05-31 ENCOUNTER — Ambulatory Visit: Payer: Medicare Other | Admitting: Family Medicine

## 2022-06-03 ENCOUNTER — Encounter: Payer: Self-pay | Admitting: Family Medicine

## 2022-06-03 ENCOUNTER — Ambulatory Visit (INDEPENDENT_AMBULATORY_CARE_PROVIDER_SITE_OTHER): Payer: Medicare Other | Admitting: Family Medicine

## 2022-06-03 VITALS — BP 117/77 | HR 93 | Temp 98.0°F | Ht 59.0 in | Wt 125.0 lb

## 2022-06-03 DIAGNOSIS — E039 Hypothyroidism, unspecified: Secondary | ICD-10-CM

## 2022-06-03 DIAGNOSIS — E538 Deficiency of other specified B group vitamins: Secondary | ICD-10-CM

## 2022-06-03 DIAGNOSIS — E7800 Pure hypercholesterolemia, unspecified: Secondary | ICD-10-CM | POA: Diagnosis not present

## 2022-06-03 DIAGNOSIS — I10 Essential (primary) hypertension: Secondary | ICD-10-CM | POA: Diagnosis not present

## 2022-06-03 DIAGNOSIS — E559 Vitamin D deficiency, unspecified: Secondary | ICD-10-CM | POA: Diagnosis not present

## 2022-06-03 DIAGNOSIS — E119 Type 2 diabetes mellitus without complications: Secondary | ICD-10-CM

## 2022-06-03 LAB — MICROALBUMIN / CREATININE URINE RATIO
Creatinine,U: 86.3 mg/dL
Microalb Creat Ratio: 0.9 mg/g (ref 0.0–30.0)
Microalb, Ur: 0.8 mg/dL (ref 0.0–1.9)

## 2022-06-03 LAB — BASIC METABOLIC PANEL
BUN: 23 mg/dL (ref 6–23)
CO2: 32 mEq/L (ref 19–32)
Calcium: 9.6 mg/dL (ref 8.4–10.5)
Chloride: 100 mEq/L (ref 96–112)
Creatinine, Ser: 0.76 mg/dL (ref 0.40–1.20)
GFR: 74.8 mL/min (ref >60.00)
Glucose, Bld: 125 mg/dL — ABNORMAL HIGH (ref 70–99)
Potassium: 4 mEq/L (ref 3.5–5.1)
Sodium: 141 mEq/L (ref 135–145)

## 2022-06-03 LAB — POCT GLYCOSYLATED HEMOGLOBIN (HGB A1C)
HbA1c POC (<> result, manual entry): 6.4 % (ref 4.0–5.6)
HbA1c, POC (controlled diabetic range): 6.4 % (ref 0.0–7.0)
HbA1c, POC (prediabetic range): 6.4 % (ref 5.7–6.4)
Hemoglobin A1C: 6.4 % — AB (ref 4.0–5.6)

## 2022-06-03 LAB — TSH: TSH: 0.13 u[IU]/mL — ABNORMAL LOW (ref 0.35–5.50)

## 2022-06-03 LAB — VITAMIN B12: Vitamin B-12: 671 pg/mL (ref 211–911)

## 2022-06-03 MED ORDER — METFORMIN HCL 500 MG PO TABS: 500.0000 mg | ORAL_TABLET | Freq: Two times a day (BID) | ORAL | 1 refills | Status: DC

## 2022-06-03 NOTE — Progress Notes (Signed)
OFFICE VISIT  06/03/2022  CC:  Chief Complaint  Patient presents with   Medical Management of Chronic Issues    Pt is fasting    Patient is a 77 y.o. female who presents for 53-monthfollow-up diabetes, hypertension, and hypothyroidism. A/P as of last visit: "1 diabetes without complication.  Good control. POC Hba1c today is 6.4%. Continue metformin 500 mg twice daily. Has been on metformin long-term--we will check vitamin B12 level due to potential for malabsorption on this medication.   2.  Essential hypertension, well controlled on amlodipine 10 mg a day and HCTZ 25 mg a day.   #3 hyperlipidemia.  She has declined medication in the past. Lipid panel today.   Hypothyroidism. Recent mild TSH elevation 3 months ago, dose adjusted accordingly. TSH today.   5.  Preventative health care: Mammogram up-to-date this month, normal. Colon cancer screening up-to-date: Cologuard - 03/2021. Vaccines:  Flu vaccine today. Tdap->declined. she declines Shingrix.  Otherwise all up-to-date."  INTERIM HX: JIzellahfeels very well. She eats a very healthy diet.  She is starting to get more active with the warmer weather, working in her garden.  Vitamin D and B12 low last visit so started some extra supplement. Thyroid a tiny bit elevated but I kept her dose the same.  No burning, tingling, or numbness in feet.  ROS as above, plus--> no fevers, no CP, no SOB, no wheezing, no cough, no dizziness, no HAs, no rashes, no melena/hematochezia.  No polyuria or polydipsia.  No myalgias or arthralgias.  No focal weakness, paresthesias, or tremors.  No acute vision or hearing abnormalities.  No dysuria or unusual/new urinary urgency or frequency.  No recent changes in lower legs. No n/v/d or abd pain.  No palpitations.    Past Medical History:  Diagnosis Date   Angioedema 01/2018   Autoimmune urticaria/angioedema-->needs chronic nonsedating antihistamine and an H2 blocker indefinitely.  1 episode ->ASA and  ARB d/c'd as precaution.  Then had another episode a couple weeks later.  Another episode just prior to seen allergist--allergist did blood workup/allergy testing->dx of autoimmune urticaria.   Beta thalassemia, heterozygous    vs alpha thalassemia minor (pt's family originally from [place]   CAP (community acquired pneumonia) 02/2010   Colon cancer screening    Pt declines colonoscopy.  Cologuard negative 01/2018 and 03/2021.   Congenital sensorineural hearing loss    deaf in right ear; mild presbycusis in left ear (audiologist 2014)   Depression    HTN (hypertension)    Hyperkalemia 07/17/2017   Decreased ARB dose, added low dose hctz, low K diet---repeat K normal.   Hyperlipidemia, mixed    Refuses statins   Hypothyroidism    Need for shingles vaccine    PT DECLINES   Osteopenia    DEXAs 2003-2013--pt has declined bisphosphonates   Somnolence, daytime, controlled 04/22/2012   Type II or unspecified type diabetes mellitus without mention of complication, uncontrolled    Vitamin D deficiency     Past Surgical History:  Procedure Laterality Date   BREAST BIOPSY     Left breast biopsy x 2 (fibrocystic/benign)   COLONOSCOPY  2008   Normal.  10 yr recall (initial colonoscopy done in [place]--needs referral to local GI in 2018.   TONSILLECTOMY AND ADENOIDECTOMY     as a child    Outpatient Medications Prior to Visit  Medication Sig Dispense Refill   amLODipine (NORVASC) 10 MG tablet Take 1 tablet (10 mg total) by mouth daily. 90 tablet 3  Calcium Carbonate-Vitamin D 600-400 MG-UNIT tablet Take 1 tablet by mouth 2 (two) times daily.     [facility] Lancets Ultra-Thin 30G MISC USE TO TEST BLOOD SUGAR ONCE DAILY DX11.9 100 each 7   glucose blood (ONETOUCH ULTRA) test strip USE TO TEST BLOOD SUGAR ONCE DAILY 100 strip 2   hydrochlorothiazide (HYDRODIURIL) 25 MG tablet Take 1 tablet (25 mg total) by mouth daily. 90 tablet 3   levothyroxine (SYNTHROID) 88 MCG tablet TAKE 1 TABLET DAILY  (Patient taking differently: Take 88 mcg by mouth daily. Take 1 and 1/2 of the 88 mcg tabs 2 days a week and take 1 tab all other days.) 30 tablet 0   Omega-3 Fatty Acids (FISH OIL) 1000 MG CAPS Take 1 capsule by mouth 2 (two) times daily.     fexofenadine (ALLEGRA) 180 MG tablet Take 1 tablet (180 mg total) by mouth daily. (Patient not taking: Reported on 06/03/2022) 90 tablet 1   metFORMIN (GLUCOPHAGE) 500 MG tablet Take 1 tablet (500 mg total) by mouth 2 (two) times daily. 180 tablet 1   No facility-administered medications prior to visit.    Allergies  Allergen Reactions   Lisinopril Swelling    NOT CLEAR ASSOCIATION    Review of Systems As per HPI  PE:    06/03/2022    8:57 AM 02/24/2022    9:39 AM 11/24/2021   10:17 AM  Vitals with BMI  Height '4\' 11"'$  '4\' 11"'$  '4\' 11"'$   Weight 125 lbs 130 lbs 133 lbs  BMI 25.23 [id number]  Systolic [id number]  Diastolic 77 74 72  Pulse 93 77 72     Physical Exam  Gen: Alert, well appearing.  Patient is oriented to person, place, time, and situation. AFFECT: pleasant, lucid thought and speech. Foot exam -  no swelling, tenderness or skin or vascular lesions. Color and temperature is normal. Sensation is intact. Peripheral pulses are palpable. Toenails are normal.   LABS:  Last CBC Lab Results  Component Value Date   WBC 6.2 10/28/2020   HGB 11.7 10/29/2020   HCT 37.5 10/29/2020   MCV 65.1 (L) 10/29/2020   MCH 20.3 (L) 10/29/2020   RDW 17.1 (H) 10/29/2020   PLT 240.0 [id number]   Last metabolic panel Lab Results  Component Value Date   GLUCOSE 116 (H) 02/24/2022   NA 140 02/24/2022   K 3.9 02/24/2022   CL 98 02/24/2022   CO2 34 (H) 02/24/2022   BUN 18 02/24/2022   CREATININE 0.74 02/24/2022   GFRNONAA 81 02/27/2018   CALCIUM 9.4 02/24/2022   PROT 6.8 02/24/2021   ALBUMIN 4.3 02/24/2021   LABGLOB 2.4 02/27/2018   AGRATIO 2.1 02/27/2018   BILITOT 0.5 02/24/2021   ALKPHOS 63 02/24/2021   AST 25 02/24/2021   ALT 16  02/24/2021   Last lipids Lab Results  Component Value Date   CHOL 210 (H) 02/24/2022   HDL 46.90 02/24/2022   LDLCALC 139 (H) 05/27/2021   LDLDIRECT 133.0 02/24/2022   TRIG 244.0 (H) 02/24/2022   CHOLHDL 4 02/24/2022   Last hemoglobin A1c Lab Results  Component Value Date   HGBA1C 6.4 (A) 06/03/2022   HGBA1C 6.4 06/03/2022   HGBA1C 6.4 06/03/2022   HGBA1C 6.4 06/03/2022   Last thyroid functions Lab Results  Component Value Date   TSH 0.14 (L) 02/24/2022   Last vitamin D Lab Results  Component Value Date   VD25OH 34.11 02/24/2022   Last vitamin B12 and Folate Lab Results  Component Value Date   VITAMINB12 150 (L) 02/24/2022   IMPRESSION AND PLAN:  #1 diabetes without complication.  Good control. POC Hba1c today is 6.4%.  Continue metformin 500 mg twice a day. Urine microalbumin/creatinine today.  Feet exam normal today. Electrolytes and creatinine today. Of note, she has had excellent gradual weight loss over the last couple of years due to improved diet and activity.  2.  Hypertension, well-controlled on amlodipine 10 mg a day and HCTZ 25 mg a day. Electrolytes and creatinine today.  3.  Hypothyroidism. Continue levothyroxine 88 mcg, 1 tab 5 days a week, 1-1/2 tabs on 2 days a week. TSH today.  4. Preventative health care: Mammogram up-to-date Nov 2023. Colon cancer screening up-to-date: Cologuard - 03/2021. Vaccines:  Flu vaccine today. Tdap->declined. she declines Shingrix.  Otherwise all up-to-date.  #5 vitamin B12 deficiency. She has been on 1000 mcg sublingual B12 tab for the last 3 months. Vitamin B12 level today.  6.  Vitamin D deficiency.  She has been on 3 of her 400 unit vitamin D tabs daily for the last few months.  Recheck vitamin D level today.  An After Visit Summary was printed and given to the patient.  FOLLOW UP: Return in about 3 months (around 09/03/2022) for routine chronic illness f/u.  Signed:  Crissie Sickles, MD           06/03/2022

## 2022-06-04 LAB — VITAMIN D 25 HYDROXY (VIT D DEFICIENCY, FRACTURES): VITD: 29.09 ng/mL — ABNORMAL LOW (ref 30.00–100.00)

## 2022-06-08 ENCOUNTER — Telehealth: Payer: Self-pay

## 2022-06-08 NOTE — Telephone Encounter (Signed)
Patient calling about test results.  She states she is having a difficult time with her mychart.  Patient can be reached at [phone].

## 2022-06-08 NOTE — Telephone Encounter (Signed)
Pt advised of results. 

## 2022-06-11 ENCOUNTER — Other Ambulatory Visit: Payer: Self-pay | Admitting: Family Medicine

## 2022-06-21 ENCOUNTER — Telehealth: Payer: Self-pay

## 2022-06-21 NOTE — Telephone Encounter (Signed)
Spoke with Amy, [facility] to confirm sig instructions for Synthroid. Based on pt's last visit, levothyroxine 88 mcg, 1 tab 5 days a week, 1-1/2 tabs on 2 days a week. Confirmed sig directions with pharmacist. Pt will receive 90 day supply.

## 2022-09-01 NOTE — Patient Instructions (Addendum)
   It was very nice to see you today!   A1c Chart  eAG = 28.7x A1c- 46.7  A1c (%)         eAG (mg/dL) 5   97 6   409    7   154 8   183 9   212 10   240 11   269 12   298  PLEASE NOTE:   If you had any lab tests please let us know if you have not heard back within a few days. You may see your results on MyChart before we have a chance to review them but we will give you a call once they are reviewed by [place]. If we ordered any referrals today, please let us know if you have not heard from their office within the next 2 weeks. You should receive a letter via MyChart confirming if the referral was approved and their office contact information to schedule.  Please try these tips to maintain a healthy lifestyle:  Eat most of your calories during the day when you are active. Eliminate processed foods including packaged sweets (pies, cakes, cookies), reduce intake of potatoes, white bread, white pasta, and white rice. Look for whole grain options, oat flour or almond flour.  Each meal should contain half fruits/vegetables, one quarter protein, and one quarter carbs (no bigger than a computer mouse).  Cut down on sweet beverages. This includes juice, soda, and sweet tea. Also watch fruit intake, though this is a healthier sweet option, it still contains natural sugar! Limit to 3 servings daily.  Drink at least 1 glass of water with each meal and aim for at least 8 glasses per day  Exercise at least 150 minutes every week.

## 2022-09-05 ENCOUNTER — Ambulatory Visit (INDEPENDENT_AMBULATORY_CARE_PROVIDER_SITE_OTHER): Payer: Medicare Other | Admitting: Family Medicine

## 2022-09-05 ENCOUNTER — Encounter: Payer: Self-pay | Admitting: Family Medicine

## 2022-09-05 VITALS — BP 117/72 | HR 71 | Ht 59.0 in | Wt 120.6 lb

## 2022-09-05 DIAGNOSIS — E559 Vitamin D deficiency, unspecified: Secondary | ICD-10-CM

## 2022-09-05 DIAGNOSIS — I10 Essential (primary) hypertension: Secondary | ICD-10-CM

## 2022-09-05 DIAGNOSIS — E039 Hypothyroidism, unspecified: Secondary | ICD-10-CM | POA: Diagnosis not present

## 2022-09-05 DIAGNOSIS — E119 Type 2 diabetes mellitus without complications: Secondary | ICD-10-CM | POA: Diagnosis not present

## 2022-09-05 DIAGNOSIS — E7800 Pure hypercholesterolemia, unspecified: Secondary | ICD-10-CM

## 2022-09-05 DIAGNOSIS — Z7984 Long term (current) use of oral hypoglycemic drugs: Secondary | ICD-10-CM

## 2022-09-05 LAB — VITAMIN D 25 HYDROXY (VIT D DEFICIENCY, FRACTURES): VITD: 35.44 ng/mL (ref 30.00–100.00)

## 2022-09-05 LAB — COMPREHENSIVE METABOLIC PANEL
ALT: 12 U/L (ref 0–35)
AST: 13 U/L (ref 0–37)
Albumin: 4.5 g/dL (ref 3.5–5.2)
Alkaline Phosphatase: 51 U/L (ref 39–117)
BUN: 22 mg/dL (ref 6–23)
CO2: 31 mEq/L (ref 19–32)
Calcium: 9.7 mg/dL (ref 8.4–10.5)
Chloride: 99 mEq/L (ref 96–112)
Creatinine, Ser: 0.83 mg/dL (ref 0.40–1.20)
GFR: 67.18 mL/min (ref >60.00)
Glucose, Bld: 123 mg/dL — ABNORMAL HIGH (ref 70–99)
Potassium: 3.6 mEq/L (ref 3.5–5.1)
Sodium: 141 mEq/L (ref 135–145)
Total Bilirubin: 0.4 mg/dL (ref 0.2–1.2)
Total Protein: 6.9 g/dL (ref 6.0–8.3)

## 2022-09-05 LAB — LIPID PANEL
Cholesterol: 225 mg/dL — ABNORMAL HIGH (ref 0–200)
HDL: 51.7 mg/dL (ref >39.00)
LDL Cholesterol: 134 mg/dL — ABNORMAL HIGH (ref 0–99)
NonHDL: 172.9
Total CHOL/HDL Ratio: 4
Triglycerides: 197 mg/dL — ABNORMAL HIGH (ref 0.0–149.0)
VLDL: 39.4 mg/dL (ref 0.0–40.0)

## 2022-09-05 LAB — TSH: TSH: 0.13 u[IU]/mL — ABNORMAL LOW (ref 0.35–5.50)

## 2022-09-05 LAB — POCT GLYCOSYLATED HEMOGLOBIN (HGB A1C)
HbA1c POC (<> result, manual entry): 6.1 % (ref 4.0–5.6)
HbA1c, POC (controlled diabetic range): 6.1 % (ref 0.0–7.0)
HbA1c, POC (prediabetic range): 6.1 % (ref 5.7–6.4)
Hemoglobin A1C: 6.1 % — AB (ref 4.0–5.6)

## 2022-09-05 NOTE — Progress Notes (Signed)
OFFICE VISIT  09/05/2022  CC:  Chief Complaint  Patient presents with   Medical Management of Chronic Issues    Patient is a 77 y.o. female who presents for 66-month follow-up diabetes, hypertension, hyperlipidemia, and hypothyroidism.  INTERIM HX: Roxane feels well. She is active with daily walking and yard work and she eats a very healthy diet high in fruits and vegetables. Home glucoses well within normal range. Home blood pressures well within normal range.  ROS-> no fevers, no CP, no SOB, no wheezing, no cough, no dizziness, no HAs, no rashes, no melena/hematochezia.  No polyuria or polydipsia.  No myalgias or arthralgias.  No focal weakness, paresthesias, or tremors.  No acute vision or hearing abnormalities.  No dysuria or unusual/new urinary urgency or frequency.  No recent changes in lower legs. No n/v/d or abd pain.  No palpitations.     Past Medical History:  Diagnosis Date   Angioedema 01/2018   Autoimmune urticaria/angioedema-->needs chronic nonsedating antihistamine and an H2 blocker indefinitely.  1 episode ->ASA and ARB d/c'd as precaution.  Then had another episode a couple weeks later.  Another episode just prior to seen allergist--allergist did blood workup/allergy testing->dx of autoimmune urticaria.   Beta thalassemia, heterozygous    vs alpha thalassemia minor (pt's family originally from [place]).   CAP (community acquired pneumonia) 02/2010   Colon cancer screening    Pt declines colonoscopy.  Cologuard negative 01/2018 and 03/2021.   Congenital sensorineural hearing loss    deaf in right ear; mild presbycusis in left ear (audiologist 2014)   Depression    HTN (hypertension)    Hyperkalemia 07/17/2017   Decreased ARB dose, added low dose hctz, low K diet---repeat K normal.   Hyperlipidemia, mixed    Refuses statins   Hypothyroidism    Need for shingles vaccine    PT DECLINES   Osteopenia    DEXAs 2003-2013--pt has declined bisphosphonates   Somnolence,  daytime, controlled 04/22/2012   Type II or unspecified type diabetes mellitus without mention of complication, uncontrolled    Vitamin D deficiency     Past Surgical History:  Procedure Laterality Date   BREAST BIOPSY     Left breast biopsy x 2 (fibrocystic/benign)   COLONOSCOPY  2008   Normal.  10 yr recall (initial colonoscopy done in [place]--needs referral to local GI in 2018.   TONSILLECTOMY AND ADENOIDECTOMY     as a child    Outpatient Medications Prior to Visit  Medication Sig Dispense Refill   amLODipine (NORVASC) 10 MG tablet Take 1 tablet (10 mg total) by mouth daily. 90 tablet 3   CALCIUM CARBONATE-VITAMIN D PO Take by mouth daily. 2000 units     [facility] Lancets Ultra-Thin 30G MISC USE TO TEST BLOOD SUGAR ONCE DAILY DX11.9 100 each 7   glucose blood (ONETOUCH ULTRA) test strip USE TO TEST BLOOD SUGAR ONCE DAILY 100 strip 2   hydrochlorothiazide (HYDRODIURIL) 25 MG tablet Take 1 tablet (25 mg total) by mouth daily. 90 tablet 3   levothyroxine (SYNTHROID) 88 MCG tablet Take 1 tablet (88 mcg total) by mouth daily. Take 1 and 1/2 of the 88 mcg tabs 2 days a week and take 1 tab all other days. 84 tablet 1   metFORMIN (GLUCOPHAGE) 500 MG tablet Take 1 tablet (500 mg total) by mouth 2 (two) times daily. 180 tablet 1   Omega-3 Fatty Acids (FISH OIL) 1000 MG CAPS Take 1 capsule by mouth 2 (two) times daily.  fexofenadine (ALLEGRA) 180 MG tablet Take 1 tablet (180 mg total) by mouth daily. (Patient not taking: Reported on 06/03/2022) 90 tablet 1   No facility-administered medications prior to visit.    Allergies  Allergen Reactions   Lisinopril Swelling    NOT CLEAR ASSOCIATION    Review of Systems As per HPI  PE:    09/05/2022    8:33 AM 06/03/2022    8:57 AM 02/24/2022    9:39 AM  Vitals with BMI  Height 4\' 11"  4\' 11"  4\' 11"   Weight 120 lbs 10 oz 125 lbs 130 lbs  BMI 24.35 25.23 26.24  Systolic 117 117 478  Diastolic 72 77 74  Pulse 71 93 77     Physical  Exam  Gen: Alert, well appearing.  Patient is oriented to person, place, time, and situation. AFFECT: pleasant, lucid thought and speech. CV: RRR, no m/r/g.   LUNGS: CTA bilat, nonlabored resps, good aeration in all lung fields. EXT: no clubbing or cyanosis.  no edema.    LABS:  Last CBC Lab Results  Component Value Date   WBC 6.2 10/28/2020   HGB 11.7 10/29/2020   HCT 37.5 10/29/2020   MCV 65.1 (L) 10/29/2020   MCH 20.3 (L) 10/29/2020   RDW 17.1 (H) 10/29/2020   PLT 240.0 10/28/2020   Last metabolic panel Lab Results  Component Value Date   GLUCOSE 125 (H) 06/03/2022   NA 141 06/03/2022   K 4.0 06/03/2022   CL 100 06/03/2022   CO2 32 06/03/2022   BUN 23 06/03/2022   CREATININE 0.76 06/03/2022   GFRNONAA 81 02/27/2018   CALCIUM 9.6 06/03/2022   PROT 6.8 02/24/2021   ALBUMIN 4.3 02/24/2021   LABGLOB 2.4 02/27/2018   AGRATIO 2.1 02/27/2018   BILITOT 0.5 02/24/2021   ALKPHOS 63 02/24/2021   AST 25 02/24/2021   ALT 16 02/24/2021   Last lipids Lab Results  Component Value Date   CHOL 210 (H) 02/24/2022   HDL 46.90 02/24/2022   LDLCALC 139 (H) 05/27/2021   LDLDIRECT 133.0 02/24/2022   TRIG 244.0 (H) 02/24/2022   CHOLHDL 4 02/24/2022   Last hemoglobin A1c Lab Results  Component Value Date   HGBA1C 6.1 (A) 09/05/2022   HGBA1C 6.1 09/05/2022   HGBA1C 6.1 09/05/2022   HGBA1C 6.1 09/05/2022   Last thyroid functions Lab Results  Component Value Date   TSH 0.13 (L) 06/03/2022   Last vitamin D Lab Results  Component Value Date   VD25OH 29.09 (L) 06/03/2022   Last vitamin B12 and Folate Lab Results  Component Value Date   VITAMINB12 671 06/03/2022   IMPRESSION AND PLAN:  #1 diabetes without complication, excellent control. POC A1c today is 6.1% Continue metformin 500 mg twice daily.  #2 hypertension, well-controlled on HCTZ 25 mg a day and amlodipine 10 mg a day. Electrolytes and creatinine today.  3.  Hypercholesterolemia, patient has declined  medication but wants to keep monitoring her levels periodically. Lipid panel today.  4.  Hypothyroidism, last couple TSH levels at lower limit of normal.  Continue levothyroxine 88 mcg, 1 tab 5 days a week, 1-1/2 tabs on 2 days a week. TSH today.  #5 preventative health care: Mammogram up-to-date Nov 2023. Colon cancer screening up-to-date: Cologuard - 03/2021. Vaccines:  Tdap->declined. she declines Shingrix.  Otherwise all up-to-date.  #6 vitamin D deficiency. She takes 1200 units vitamin D daily. Vitamin D level today.  An After Visit Summary was printed and given to the patient.  FOLLOW UP: Return in about 3 months (around 12/06/2022) for routine chronic illness f/u.  Signed:  Santiago Bumpers, MD           09/05/2022

## 2022-10-03 DIAGNOSIS — L570 Actinic keratosis: Secondary | ICD-10-CM | POA: Diagnosis not present

## 2022-10-03 DIAGNOSIS — L578 Other skin changes due to chronic exposure to nonionizing radiation: Secondary | ICD-10-CM | POA: Diagnosis not present

## 2022-10-03 DIAGNOSIS — L814 Other melanin hyperpigmentation: Secondary | ICD-10-CM | POA: Diagnosis not present

## 2022-10-03 DIAGNOSIS — D229 Melanocytic nevi, unspecified: Secondary | ICD-10-CM | POA: Diagnosis not present

## 2022-11-10 ENCOUNTER — Encounter (INDEPENDENT_AMBULATORY_CARE_PROVIDER_SITE_OTHER): Payer: Self-pay

## 2022-11-22 ENCOUNTER — Other Ambulatory Visit: Payer: Self-pay | Admitting: Family Medicine

## 2022-11-22 DIAGNOSIS — E119 Type 2 diabetes mellitus without complications: Secondary | ICD-10-CM

## 2022-12-01 ENCOUNTER — Other Ambulatory Visit: Payer: Self-pay | Admitting: Family Medicine

## 2022-12-05 DIAGNOSIS — E119 Type 2 diabetes mellitus without complications: Secondary | ICD-10-CM | POA: Diagnosis not present

## 2022-12-05 DIAGNOSIS — H2513 Age-related nuclear cataract, bilateral: Secondary | ICD-10-CM | POA: Diagnosis not present

## 2022-12-05 DIAGNOSIS — H524 Presbyopia: Secondary | ICD-10-CM | POA: Diagnosis not present

## 2022-12-05 LAB — HM DIABETES EYE EXAM

## 2022-12-06 ENCOUNTER — Encounter: Payer: Self-pay | Admitting: Family Medicine

## 2022-12-06 ENCOUNTER — Ambulatory Visit (INDEPENDENT_AMBULATORY_CARE_PROVIDER_SITE_OTHER): Payer: Medicare Other | Admitting: Family Medicine

## 2022-12-06 VITALS — BP 135/76 | HR 58 | Temp 97.8°F | Ht 59.0 in | Wt 124.8 lb

## 2022-12-06 DIAGNOSIS — E039 Hypothyroidism, unspecified: Secondary | ICD-10-CM

## 2022-12-06 DIAGNOSIS — Z7984 Long term (current) use of oral hypoglycemic drugs: Secondary | ICD-10-CM

## 2022-12-06 DIAGNOSIS — I10 Essential (primary) hypertension: Secondary | ICD-10-CM

## 2022-12-06 DIAGNOSIS — Z23 Encounter for immunization: Secondary | ICD-10-CM

## 2022-12-06 DIAGNOSIS — E119 Type 2 diabetes mellitus without complications: Secondary | ICD-10-CM | POA: Diagnosis not present

## 2022-12-06 LAB — BASIC METABOLIC PANEL
BUN: 17 mg/dL (ref 6–23)
CO2: 31 meq/L (ref 19–32)
Calcium: 9.7 mg/dL (ref 8.4–10.5)
Chloride: 99 meq/L (ref 96–112)
Creatinine, Ser: 0.73 mg/dL (ref 0.40–1.20)
GFR: 78.23 mL/min (ref >60.00)
Glucose, Bld: 99 mg/dL (ref 70–99)
Potassium: 3.9 meq/L (ref 3.5–5.1)
Sodium: 140 meq/L (ref 135–145)

## 2022-12-06 LAB — POCT GLYCOSYLATED HEMOGLOBIN (HGB A1C)
HbA1c POC (<> result, manual entry): 6.2 % (ref 4.0–5.6)
HbA1c, POC (controlled diabetic range): 6.2 % (ref 0.0–7.0)
HbA1c, POC (prediabetic range): 6.2 % (ref 5.7–6.4)
Hemoglobin A1C: 6.2 % — AB (ref 4.0–5.6)

## 2022-12-06 LAB — TSH: TSH: 0.49 u[IU]/mL (ref 0.35–5.50)

## 2022-12-06 MED ORDER — HYDROCHLOROTHIAZIDE 25 MG PO TABS: 25.0000 mg | ORAL_TABLET | Freq: Every day | ORAL | 1 refills | Status: DC

## 2022-12-06 MED ORDER — METFORMIN HCL 500 MG PO TABS: 500.0000 mg | ORAL_TABLET | Freq: Two times a day (BID) | ORAL | 1 refills | Status: DC

## 2022-12-06 MED ORDER — LEVOTHYROXINE SODIUM 88 MCG PO TABS: ORAL_TABLET | ORAL | 1 refills | Status: DC

## 2022-12-06 MED ORDER — AMLODIPINE BESYLATE 10 MG PO TABS: 10.0000 mg | ORAL_TABLET | Freq: Every day | ORAL | 1 refills | Status: DC

## 2022-12-06 NOTE — Progress Notes (Signed)
OFFICE VISIT  12/06/2022  CC:  Chief Complaint  Patient presents with   Medical Management of Chronic Issues    Pt is fasting    Patient is a 77 y.o. female who presents for 41-month follow-up diabetes, hypertension, hyperlipidemia, and hypothyroidism. A/P as of last visit: "1 diabetes without complication, excellent control. POC A1c today is 6.1% Continue metformin 500 mg twice daily.   #2 hypertension, well-controlled on HCTZ 25 mg a day and amlodipine 10 mg a day. Electrolytes and creatinine today.   3.  Hypercholesterolemia, patient has declined medication but wants to keep monitoring her levels periodically. Lipid panel today.   4.  Hypothyroidism, last couple TSH levels at lower limit of normal.  Continue levothyroxine 88 mcg, 1 tab 5 days a week, 1-1/2 tabs on 2 days a week. TSH today.   #5 preventative health care: Mammogram up-to-date Nov 2023. Colon cancer screening up-to-date: Cologuard - 03/2021. Vaccines:  Tdap->declined. she declines Shingrix.  Otherwise all up-to-date.   #6 vitamin D deficiency. She takes 1200 units vitamin D daily. Vitamin D level today."  INTERIM HX: She feels very well. She has a good report about her daughter, who had been struggling and she had been supporting for quite a while.  She got a job and is doing well now. This takes a big load of anxiety off of [place].  ROS as above, plus--> no fevers, no CP, no SOB, no wheezing, no cough, no dizziness, no HAs, no rashes, no melena/hematochezia.  No polyuria or polydipsia.  No myalgias or arthralgias.  No focal weakness, paresthesias, or tremors.  No acute vision or hearing abnormalities.  No dysuria or unusual/new urinary urgency or frequency.  No recent changes in lower legs. No n/v/d or abd pain.  No palpitations.    Past Medical History:  Diagnosis Date   Angioedema 01/2018   Autoimmune urticaria/angioedema-->needs chronic nonsedating antihistamine and an H2 blocker indefinitely.  1  episode ->ASA and ARB d/c'd as precaution.  Then had another episode a couple weeks later.  Another episode just prior to seen allergist--allergist did blood workup/allergy testing->dx of autoimmune urticaria.   Beta thalassemia, heterozygous    vs alpha thalassemia minor (pt's family originally from [place]).   CAP (community acquired pneumonia) 02/2010   Colon cancer screening    Pt declines colonoscopy.  Cologuard negative 01/2018 and 03/2021.   Congenital sensorineural hearing loss    deaf in right ear; mild presbycusis in left ear (audiologist 2014)   Depression    HTN (hypertension)    Hyperkalemia 07/17/2017   Decreased ARB dose, added low dose hctz, low K diet---repeat K normal.   Hyperlipidemia, mixed    Refuses statins   Hypothyroidism    Need for shingles vaccine    PT DECLINES   Osteopenia    DEXAs 2003-2013--pt has declined bisphosphonates   Somnolence, daytime, controlled 04/22/2012   Type II or unspecified type diabetes mellitus without mention of complication, uncontrolled    Vitamin D deficiency     Past Surgical History:  Procedure Laterality Date   BREAST BIOPSY     Left breast biopsy x 2 (fibrocystic/benign)   COLONOSCOPY  2008   Normal.  10 yr recall (initial colonoscopy done in [place]--needs referral to local GI in 2018.   TONSILLECTOMY AND ADENOIDECTOMY     as a child    Outpatient Medications Prior to Visit  Medication Sig Dispense Refill   CALCIUM CARBONATE-VITAMIN D PO Take by mouth daily. 2000 units  [facility] Lancets Ultra-Thin 30G MISC USE TO TEST BLOOD SUGAR ONCE DAILY DX11.9 100 each 7   Cyanocobalamin (B-12 PO) Take by mouth.     Omega-3 Fatty Acids (FISH OIL) 1000 MG CAPS Take 1 capsule by mouth 2 (two) times daily.     ONETOUCH ULTRA test strip USE TO TEST BLOOD SUGAR ONCE DAILY 100 strip 2   fexofenadine (ALLEGRA) 180 MG tablet Take 1 tablet (180 mg total) by mouth daily. (Patient not taking: Reported on 06/03/2022) 90 tablet 1   amLODipine  (NORVASC) 10 MG tablet Take 1 tablet (10 mg total) by mouth daily. 90 tablet 3   hydrochlorothiazide (HYDRODIURIL) 25 MG tablet Take 1 tablet (25 mg total) by mouth daily. 90 tablet 3   levothyroxine (SYNTHROID) 88 MCG tablet TAKE 1 TABLET FIVE DAYS A  WEEK AND 1 AND 1/2 TABLETS TWO DAYS A WEEK 10 tablet 0   metFORMIN (GLUCOPHAGE) 500 MG tablet Take 1 tablet (500 mg total) by mouth 2 (two) times daily. 180 tablet 1   No facility-administered medications prior to visit.    Allergies  Allergen Reactions   Lisinopril Swelling    NOT CLEAR ASSOCIATION    Review of Systems As per HPI  PE:    12/06/2022   10:14 AM 09/05/2022    8:33 AM 06/03/2022    8:57 AM  Vitals with BMI  Height 4\' 11"  4\' 11"  4\' 11"   Weight 124 lbs 13 oz 120 lbs 10 oz 125 lbs  BMI 25.19 24.35 25.23  Systolic 135 117 161  Diastolic 76 72 77  Pulse 58 71 93     Physical Exam  Gen: Alert, well appearing.  Patient is oriented to person, place, time, and situation. AFFECT: pleasant, lucid thought and speech. CV: RRR, no m/r/g.   LUNGS: CTA bilat, nonlabored resps, good aeration in all lung fields. EXT: no clubbing or cyanosis.  no edema.    LABS:  Last CBC Lab Results  Component Value Date   WBC 6.2 10/28/2020   HGB 11.7 10/29/2020   HCT 37.5 10/29/2020   MCV 65.1 (L) 10/29/2020   MCH 20.3 (L) 10/29/2020   RDW 17.1 (H) 10/29/2020   PLT 240.0 10/28/2020   Lab Results  Component Value Date   IRON 137 05/24/2012   TIBC 375 05/24/2012   Last metabolic panel Lab Results  Component Value Date   GLUCOSE 123 (H) 09/05/2022   NA 141 09/05/2022   K 3.6 09/05/2022   CL 99 09/05/2022   CO2 31 09/05/2022   BUN 22 09/05/2022   CREATININE 0.83 09/05/2022   GFR 67.18 09/05/2022   CALCIUM 9.7 09/05/2022   PROT 6.9 09/05/2022   ALBUMIN 4.5 09/05/2022   LABGLOB 2.4 02/27/2018   AGRATIO 2.1 02/27/2018   BILITOT 0.4 09/05/2022   ALKPHOS 51 09/05/2022   AST 13 09/05/2022   ALT 12 09/05/2022   Last  lipids Lab Results  Component Value Date   CHOL 225 (H) 09/05/2022   HDL 51.70 09/05/2022   LDLCALC 134 (H) 09/05/2022   LDLDIRECT 133.0 02/24/2022   TRIG 197.0 (H) 09/05/2022   CHOLHDL 4 09/05/2022   Last hemoglobin A1c Lab Results  Component Value Date   HGBA1C 6.1 (A) 09/05/2022   HGBA1C 6.1 09/05/2022   HGBA1C 6.1 09/05/2022   HGBA1C 6.1 09/05/2022   Last thyroid functions Lab Results  Component Value Date   TSH 0.13 (L) 09/05/2022   Last vitamin D Lab Results  Component Value Date  VD25OH 35.44 09/05/2022   Last vitamin B12 and Folate Lab Results  Component Value Date   VITAMINB12 671 06/03/2022   IMPRESSION AND PLAN:  #1 diabetes without complication, well-controlled. POC Hba1c today is 6.2%. Continue metformin 500 mg twice daily.  2.  Hypertension, well-controlled on amlodipine 10 mg a day and HCTZ 25 mg/day. Check electrolytes and creatinine today.  3. Hypothyroidism, last few TSH levels at lower limit of normal and stable.  Continue levothyroxine 88 mcg, 1 tab 5 days a week, 1-1/2 tabs on 2 days a week. TSH today.  #4 preventative health care: Mammogram due Nov/Dec this yr. Colon cancer screening up-to-date: Cologuard - 03/2021. Vaccines:  Tdap->declined. she declines Shingrix.  Flu-->given today.   Otherwise all up-to-date.  An After Visit Summary was printed and given to the patient.  FOLLOW UP: Return in about 3 months (around 03/07/2023) for routine chronic illness f/u.  Signed:  Santiago Bumpers, MD           12/06/2022

## 2023-01-23 ENCOUNTER — Other Ambulatory Visit: Payer: Self-pay | Admitting: Family Medicine

## 2023-01-23 DIAGNOSIS — Z1231 Encounter for screening mammogram for malignant neoplasm of breast: Secondary | ICD-10-CM

## 2023-03-06 ENCOUNTER — Ambulatory Visit
Admission: RE | Admit: 2023-03-06 | Discharge: 2023-03-06 | Disposition: A | Discharge status: Home / Self Care | Payer: Medicare Other | Source: Ambulatory Visit | Attending: Family Medicine | Admitting: Family Medicine

## 2023-03-06 DIAGNOSIS — Z1231 Encounter for screening mammogram for malignant neoplasm of breast: Secondary | ICD-10-CM

## 2023-03-07 ENCOUNTER — Encounter: Payer: Self-pay | Admitting: Family Medicine

## 2023-03-07 ENCOUNTER — Ambulatory Visit: Payer: Medicare Other | Admitting: Family Medicine

## 2023-03-07 VITALS — BP 136/77 | HR 65 | Wt 125.8 lb

## 2023-03-07 DIAGNOSIS — I10 Essential (primary) hypertension: Secondary | ICD-10-CM

## 2023-03-07 DIAGNOSIS — N3946 Mixed incontinence: Secondary | ICD-10-CM

## 2023-03-07 DIAGNOSIS — Z7984 Long term (current) use of oral hypoglycemic drugs: Secondary | ICD-10-CM

## 2023-03-07 DIAGNOSIS — E119 Type 2 diabetes mellitus without complications: Secondary | ICD-10-CM

## 2023-03-07 LAB — BASIC METABOLIC PANEL
BUN: 23 mg/dL (ref 6–23)
CO2: 33 meq/L — ABNORMAL HIGH (ref 19–32)
Calcium: 10.1 mg/dL (ref 8.4–10.5)
Chloride: 98 meq/L (ref 96–112)
Creatinine, Ser: 0.76 mg/dL (ref 0.40–1.20)
GFR: 74.41 mL/min (ref >60.00)
Glucose, Bld: 112 mg/dL — ABNORMAL HIGH (ref 70–99)
Potassium: 4 meq/L (ref 3.5–5.1)
Sodium: 141 meq/L (ref 135–145)

## 2023-03-07 LAB — POCT GLYCOSYLATED HEMOGLOBIN (HGB A1C)
HbA1c POC (<> result, manual entry): 6.3 % (ref 4.0–5.6)
HbA1c, POC (controlled diabetic range): 6.3 % (ref 0.0–7.0)
HbA1c, POC (prediabetic range): 6.3 % (ref 5.7–6.4)
Hemoglobin A1C: 6.3 % — AB (ref 4.0–5.6)

## 2023-03-07 NOTE — Patient Instructions (Signed)
Kegel Exercises  Kegel exercises can help strengthen your pelvic floor muscles. The pelvic floor is a group of muscles that support your rectum, small intestine, and bladder. In females, pelvic floor muscles also help support the uterus. These muscles help you control the flow of urine and stool (feces). Kegel exercises are painless and simple. They do not require any equipment. Your provider may suggest Kegel exercises to: Improve bladder and bowel control. Improve sexual response. Improve weak pelvic floor muscles after surgery to remove the uterus (hysterectomy) or after pregnancy, in females. Improve weak pelvic floor muscles after prostate gland removal or surgery, in males. Kegel exercises involve squeezing your pelvic floor muscles. These are the same muscles you squeeze when you try to stop the flow of urine or keep from passing gas. The exercises can be done while sitting, standing, or lying down, but it is best to vary your position. Ask your health care provider which exercises are safe for you. Do exercises exactly as told by your health care provider and adjust them as directed. Do not begin these exercises until told by your health care provider. Exercises How to do Kegel exercises: Squeeze your pelvic floor muscles tight. You should feel a tight lift in your rectal area. If you are a female, you should also feel a tightness in your vaginal area. Keep your stomach, buttocks, and legs relaxed. Hold the muscles tight for up to 10 seconds. Breathe normally. Relax your muscles for up to 10 seconds. Repeat as told by your health care provider. Repeat this exercise daily as told by your health care provider. Continue to do this exercise for at least 4-6 weeks, or for as long as told by your health care provider. You may be referred to a physical therapist who can help you learn more about how to do Kegel exercises. Depending on your condition, your health care provider may  recommend: Varying how long you squeeze your muscles. Doing several sets of exercises every day. Doing exercises for several weeks. Making Kegel exercises a part of your regular exercise routine. This information is not intended to replace advice given to you by your health care provider. Make sure you discuss any questions you have with your health care provider. Document Revised: 07/23/2020 Document Reviewed: 07/23/2020 Elsevier Patient Education  2024 Elsevier Inc.  

## 2023-03-07 NOTE — Progress Notes (Signed)
OFFICE VISIT  03/07/2023  CC:  Chief Complaint  Patient presents with   Follow-up    3 month follow up. No questions or concerns.    Patient is a 77 y.o. female who presents for 44-month follow-up diabetes, hypertension, hyperlipidemia, and hypothyroidism. A/P as of last visit: "#1 diabetes without complication, well-controlled. POC Hba1c today is 6.2%. Continue metformin 500 mg twice daily.   2.  Hypertension, well-controlled on amlodipine 10 mg a day and HCTZ 25 mg/day. Check electrolytes and creatinine today.   3. Hypothyroidism, last few TSH levels at lower limit of normal and stable.  Continue levothyroxine 88 mcg, 1 tab 5 days a week, 1-1/2 tabs on 2 days a week. TSH today.   #4 preventative health care: Mammogram due Nov/Dec this yr. Colon cancer screening up-to-date: Cologuard - 03/2021. Vaccines:  Tdap->declined. she declines Shingrix.  Flu-->given today.   Otherwise all up-to-date."  INTERIM HX: GYN feels well. She has intermittent problems with mixed urge and stress incontinence.  She can go few days without any problem and then 1 day have an episode 2 of complete incontinence.  No dysuria or suprapubic discomfort. No constipation or diarrhea.  Glucoses in the 110-115 range typically.  ROS as above, plus--> no fevers, no CP, no SOB, no wheezing, no cough, no dizziness, no HAs, no rashes, no melena/hematochezia.  No polyuria or polydipsia.  No myalgias or arthralgias.  No focal weakness, paresthesias, or tremors.  No acute vision or hearing abnormalities.  .  No recent changes in lower legs. No n/v/d or abd pain.  No palpitations.     Past Medical History:  Diagnosis Date   Angioedema 01/2018   Autoimmune urticaria/angioedema-->needs chronic nonsedating antihistamine and an H2 blocker indefinitely.  1 episode ->ASA and ARB d/c'd as precaution.  Then had another episode a couple weeks later.  Another episode just prior to seen allergist--allergist did blood  workup/allergy testing->dx of autoimmune urticaria.   Beta thalassemia, heterozygous    vs alpha thalassemia minor (pt's family originally from [place]).   CAP (community acquired pneumonia) 02/2010   Colon cancer screening    Pt declines colonoscopy.  Cologuard negative 01/2018 and 03/2021.   Congenital sensorineural hearing loss    deaf in right ear; mild presbycusis in left ear (audiologist 2014)   Depression    HTN (hypertension)    Hyperkalemia 07/17/2017   Decreased ARB dose, added low dose hctz, low K diet---repeat K normal.   Hyperlipidemia, mixed    Refuses statins   Hypothyroidism    Need for shingles vaccine    PT DECLINES   Osteopenia    DEXAs 2003-2013--pt has declined bisphosphonates   Somnolence, daytime, controlled 04/22/2012   Type II or unspecified type diabetes mellitus without mention of complication, uncontrolled    Vitamin D deficiency     Past Surgical History:  Procedure Laterality Date   BREAST BIOPSY     Left breast biopsy x 2 (fibrocystic/benign)   COLONOSCOPY  2008   Normal.  10 yr recall (initial colonoscopy done in [place]--needs referral to local GI in 2018.   TONSILLECTOMY AND ADENOIDECTOMY     as a child    Outpatient Medications Prior to Visit  Medication Sig Dispense Refill   amLODipine (NORVASC) 10 MG tablet Take 1 tablet (10 mg total) by mouth daily. 90 tablet 1   CALCIUM CARBONATE-VITAMIN D PO Take by mouth daily. 2000 units     [employer] Lancets Ultra-Thin 30G MISC USE TO TEST BLOOD SUGAR ONCE  DAILY DX11.9 100 each 7   Cyanocobalamin (B-12 PO) Take by mouth.     hydrochlorothiazide (HYDRODIURIL) 25 MG tablet Take 1 tablet (25 mg total) by mouth daily. 90 tablet 1   levothyroxine (SYNTHROID) 88 MCG tablet TAKE 1 TABLET FIVE DAYS A  WEEK AND 1 AND 1/2 TABLETS TWO DAYS A WEEK 102 tablet 1   metFORMIN (GLUCOPHAGE) 500 MG tablet Take 1 tablet (500 mg total) by mouth 2 (two) times daily. 180 tablet 1   Omega-3 Fatty Acids (FISH OIL) 1000 MG CAPS  Take 1 capsule by mouth 2 (two) times daily.     ONETOUCH ULTRA test strip USE TO TEST BLOOD SUGAR ONCE DAILY 100 strip 2   fexofenadine (ALLEGRA) 180 MG tablet Take 1 tablet (180 mg total) by mouth daily. (Patient not taking: Reported on 06/03/2022) 90 tablet 1   No facility-administered medications prior to visit.    Allergies  Allergen Reactions   Lisinopril Swelling    NOT CLEAR ASSOCIATION    Review of Systems As per HPI  PE:    03/07/2023    8:40 AM 12/06/2022   10:14 AM 09/05/2022    8:33 AM  Vitals with BMI  Height  4\' 11"  4\' 11"   Weight 125 lbs 13 oz 124 lbs 13 oz 120 lbs 10 oz  BMI  25.19 24.35  Systolic 136 135 098  Diastolic 77 76 72  Pulse 65 58 71     Physical Exam  Gen: Alert, well appearing.  Patient is oriented to person, place, time, and situation. AFFECT: pleasant, lucid thought and speech. No further exam today  LABS:  Last CBC Lab Results  Component Value Date   WBC 6.2 10/28/2020   HGB 11.7 10/29/2020   HCT 37.5 10/29/2020   MCV 65.1 (L) 10/29/2020   MCH 20.3 (L) 10/29/2020   RDW 17.1 (H) 10/29/2020   PLT 240.0 10/28/2020   Last metabolic panel Lab Results  Component Value Date   GLUCOSE 99 12/06/2022   NA 140 12/06/2022   K 3.9 12/06/2022   CL 99 12/06/2022   CO2 31 12/06/2022   BUN 17 12/06/2022   CREATININE 0.73 12/06/2022   GFR 78.23 12/06/2022   CALCIUM 9.7 12/06/2022   PROT 6.9 09/05/2022   ALBUMIN 4.5 09/05/2022   LABGLOB 2.4 02/27/2018   AGRATIO 2.1 02/27/2018   BILITOT 0.4 09/05/2022   ALKPHOS 51 09/05/2022   AST 13 09/05/2022   ALT 12 09/05/2022   Last lipids Lab Results  Component Value Date   CHOL 225 (H) 09/05/2022   HDL 51.70 09/05/2022   LDLCALC 134 (H) 09/05/2022   LDLDIRECT 133.0 02/24/2022   TRIG 197.0 (H) 09/05/2022   CHOLHDL 4 09/05/2022   Last hemoglobin A1c Lab Results  Component Value Date   HGBA1C 6.3 (A) 03/07/2023   HGBA1C 6.3 03/07/2023   HGBA1C 6.3 03/07/2023   HGBA1C 6.3 03/07/2023    Last thyroid functions Lab Results  Component Value Date   TSH 0.49 12/06/2022   Last vitamin D Lab Results  Component Value Date   VD25OH 35.44 09/05/2022   Last vitamin B12 and Folate Lab Results  Component Value Date   VITAMINB12 671 06/03/2022   IMPRESSION AND PLAN:  #1 diabetes without complication.  Good control on metformin 500 mg twice daily. Electrolytes and creatinine monitoring today. POC Hba1c 6.3% today.  #2 hypertension, well-controlled on HCTZ 25 mg a day and amlodipine 10 mg a day. Electrolytes and creatinine monitoring today.  #3  hypercholesterolemia, patient has declined medication but wants to keep monitoring her levels periodically. LDL was 134 about 6 months ago. Plan repeat lipids in 6 months.  #4 hypothyroidism, last few TSH levels at lower limit of normal and stable. Most recent was 0.49 about 3 months ago. Continue levothyroxine 88 mcg, 1 tab 5 days a week, 1-1/2 tabs on 2 days a week. Plan repeat TSH approximately September 2025.  #5 mixed urge/stress incontinence. She will try to adjust fluid intake depending on whether she is going out of her house or not.  She will practice some Kegel maneuvers--> printed out today.  An After Visit Summary was printed and given to the patient.  FOLLOW UP: Return in about 3 months (around 06/05/2023) for routine chronic illness f/u.  Signed:  Santiago Bumpers, MD           03/07/2023

## 2023-03-08 ENCOUNTER — Ambulatory Visit: Payer: Medicare Other

## 2023-03-09 ENCOUNTER — Encounter: Payer: Self-pay | Admitting: Family Medicine

## 2023-03-09 NOTE — Telephone Encounter (Signed)
No further action needed.

## 2023-03-15 ENCOUNTER — Ambulatory Visit: Payer: Medicare Other | Admitting: *Deleted

## 2023-03-15 DIAGNOSIS — Z0000 Encounter for general adult medical examination without abnormal findings: Secondary | ICD-10-CM

## 2023-03-15 NOTE — Patient Instructions (Signed)
Mackenzie Wood , Thank you for taking time to come for your Medicare Wellness Visit. I appreciate your ongoing commitment to your health goals. Please review the following plan we discussed and let me know if I can assist you in the future.   Screening recommendations/referrals: Colonoscopy: no longer required Mammogram: up to date Bone Density: Education provided Recommended yearly ophthalmology/optometry visit for glaucoma screening and checkup Recommended yearly dental visit for hygiene and checkup  Vaccinations: Influenza vaccine: up to date Pneumococcal vaccine: up to date Tdap vaccine: Education provided Shingles vaccine: Education provided      Preventive Care 65 Years and Older, Female Preventive care refers to lifestyle choices and visits with your health care provider that can promote health and wellness. What does preventive care include? A yearly physical exam. This is also called an annual well check. Dental exams once or twice a year. Routine eye exams. Ask your health care provider how often you should have your eyes checked. Personal lifestyle choices, including: Daily care of your teeth and gums. Regular physical activity. Eating a healthy diet. Avoiding tobacco and drug use. Limiting alcohol use. Practicing safe sex. Taking low-dose aspirin every day. Taking vitamin and mineral supplements as recommended by your health care provider. What happens during an annual well check? The services and screenings done by your health care provider during your annual well check will depend on your age, overall health, lifestyle risk factors, and family history of disease. Counseling  Your health care provider may ask you questions about your: Alcohol use. Tobacco use. Drug use. Emotional well-being. Home and relationship well-being. Sexual activity. Eating habits. History of falls. Memory and ability to understand (cognition). Work and work Astronomer. Reproductive  health. Screening  You may have the following tests or measurements: Height, weight, and BMI. Blood pressure. Lipid and cholesterol levels. These may be checked every 5 years, or more frequently if you are over 30 years old. Skin check. Lung cancer screening. You may have this screening every year starting at age 79 if you have a 30-pack-year history of smoking and currently smoke or have quit within the past 15 years. Fecal occult blood test (FOBT) of the stool. You may have this test every year starting at age 81. Flexible sigmoidoscopy or colonoscopy. You may have a sigmoidoscopy every 5 years or a colonoscopy every 10 years starting at age 79. Hepatitis C blood test. Hepatitis B blood test. Sexually transmitted disease (STD) testing. Diabetes screening. This is done by checking your blood sugar (glucose) after you have not eaten for a while (fasting). You may have this done every 1-3 years. Bone density scan. This is done to screen for osteoporosis. You may have this done starting at age 79. Mammogram. This may be done every 1-2 years. Talk to your health care provider about how often you should have regular mammograms. Talk with your health care provider about your test results, treatment options, and if necessary, the need for more tests. Vaccines  Your health care provider may recommend certain vaccines, such as: Influenza vaccine. This is recommended every year. Tetanus, diphtheria, and acellular pertussis (Tdap, Td) vaccine. You may need a Td booster every 10 years. Zoster vaccine. You may need this after age 49. Pneumococcal 13-valent conjugate (PCV13) vaccine. One dose is recommended after age 79. Pneumococcal polysaccharide (PPSV23) vaccine. One dose is recommended after age 73. Talk to your health care provider about which screenings and vaccines you need and how often you need them. This information is not  intended to replace advice given to you by your health care provider.  Make sure you discuss any questions you have with your health care provider. Document Released: 04/10/2015 Document Revised: 12/02/2015 Document Reviewed: 01/13/2015 Elsevier Interactive Patient Education  2017 [employer].  Fall Prevention in the Home Falls can cause injuries. They can happen to people of all ages. There are many things you can do to make your home safe and to help prevent falls. What can I do on the outside of my home? Regularly fix the edges of walkways and driveways and fix any cracks. Remove anything that might make you trip as you walk through a door, such as a raised step or threshold. Trim any bushes or trees on the path to your home. Use bright outdoor lighting. Clear any walking paths of anything that might make someone trip, such as rocks or tools. Regularly check to see if handrails are loose or broken. Make sure that both sides of any steps have handrails. Any raised decks and porches should have guardrails on the edges. Have any leaves, snow, or ice cleared regularly. Use sand or salt on walking paths during winter. Clean up any spills in your garage right away. This includes oil or grease spills. What can I do in the bathroom? Use night lights. Install grab bars by the toilet and in the tub and shower. Do not use towel bars as grab bars. Use non-skid mats or decals in the tub or shower. If you need to sit down in the shower, use a plastic, non-slip stool. Keep the floor dry. Clean up any water that spills on the floor as soon as it happens. Remove soap buildup in the tub or shower regularly. Attach bath mats securely with double-sided non-slip rug tape. Do not have throw rugs and other things on the floor that can make you trip. What can I do in the bedroom? Use night lights. Make sure that you have a light by your bed that is easy to reach. Do not use any sheets or blankets that are too big for your bed. They should not hang down onto the floor. Have a  firm chair that has side arms. You can use this for support while you get dressed. Do not have throw rugs and other things on the floor that can make you trip. What can I do in the kitchen? Clean up any spills right away. Avoid walking on wet floors. Keep items that you use a lot in easy-to-reach places. If you need to reach something above you, use a strong step stool that has a grab bar. Keep electrical cords out of the way. Do not use floor polish or wax that makes floors slippery. If you must use wax, use non-skid floor wax. Do not have throw rugs and other things on the floor that can make you trip. What can I do with my stairs? Do not leave any items on the stairs. Make sure that there are handrails on both sides of the stairs and use them. Fix handrails that are broken or loose. Make sure that handrails are as long as the stairways. Check any carpeting to make sure that it is firmly attached to the stairs. Fix any carpet that is loose or worn. Avoid having throw rugs at the top or bottom of the stairs. If you do have throw rugs, attach them to the floor with carpet tape. Make sure that you have a light switch at the top of the stairs  and the bottom of the stairs. If you do not have them, ask someone to add them for you. What else can I do to help prevent falls? Wear shoes that: Do not have high heels. Have rubber bottoms. Are comfortable and fit you well. Are closed at the toe. Do not wear sandals. If you use a stepladder: Make sure that it is fully opened. Do not climb a closed stepladder. Make sure that both sides of the stepladder are locked into place. Ask someone to hold it for you, if possible. Clearly mark and make sure that you can see: Any grab bars or handrails. First and last steps. Where the edge of each step is. Use tools that help you move around (mobility aids) if they are needed. These include: Canes. Walkers. Scooters. Crutches. Turn on the lights when you  go into a dark area. Replace any light bulbs as soon as they burn out. Set up your furniture so you have a clear path. Avoid moving your furniture around. If any of your floors are uneven, fix them. If there are any pets around you, be aware of where they are. Review your medicines with your doctor. Some medicines can make you feel dizzy. This can increase your chance of falling. Ask your doctor what other things that you can do to help prevent falls. This information is not intended to replace advice given to you by your health care provider. Make sure you discuss any questions you have with your health care provider. Document Released: 01/08/2009 Document Revised: 08/20/2015 Document Reviewed: 04/18/2014 Elsevier Interactive Patient Education  2017 [employer].

## 2023-03-15 NOTE — Progress Notes (Signed)
Subjective:   Mackenzie Wood is a 77 y.o. female who presents for Medicare Annual (Subsequent) preventive examination.  Visit Complete: Virtual I connected with  Mackenzie Wood on 03/15/23 by a audio enabled telemedicine application and verified that I am speaking with the correct person using two identifiers.  Patient Location: Home  Provider Location: Home Office  I discussed the limitations of evaluation and management by telemedicine. The patient expressed understanding and agreed to proceed.  Vital Signs: Because this visit was a virtual/telehealth visit, some criteria may be missing or patient reported. Any vitals not documented were not able to be obtained and vitals that have been documented are patient reported.    Cardiac Risk Factors include: advanced age (>26men, >62 women);diabetes mellitus;hypertension     Objective:    There were no vitals filed for this visit. There is no height or weight on file to calculate BMI.     03/15/2023   10:57 AM 03/02/2022    4:11 PM 02/10/2021   11:50 AM 02/05/2020    9:50 AM 01/13/2017   11:25 AM 06/21/2014    8:15 AM  Advanced Directives  Does Patient Have a Medical Advance Directive? No Yes Yes Yes Yes No  Type of Advance Directive  Living will;Healthcare Power of [employer] Power of [place];Living will Healthcare Power of [place];Living will Healthcare Power of [place];Living will   Does patient want to make changes to medical advance directive?  No - Patient declined      Copy of Healthcare Power of Attorney in Chart?  No - copy requested No - copy requested No - copy requested No - copy requested   Would patient like information on creating a medical advance directive? No - Patient declined     No - patient declined information    Current Medications (verified) Outpatient Encounter Medications as of 03/15/2023  Medication Sig   amLODipine (NORVASC) 10 MG tablet Take 1 tablet (10 mg total) by mouth  daily.   CALCIUM CARBONATE-VITAMIN D PO Take by mouth daily. 2000 units   [employer] Lancets Ultra-Thin 30G MISC USE TO TEST BLOOD SUGAR ONCE DAILY DX11.9   Cyanocobalamin (B-12 PO) Take by mouth.   hydrochlorothiazide (HYDRODIURIL) 25 MG tablet Take 1 tablet (25 mg total) by mouth daily.   levothyroxine (SYNTHROID) 88 MCG tablet TAKE 1 TABLET FIVE DAYS A  WEEK AND 1 AND 1/2 TABLETS TWO DAYS A WEEK   metFORMIN (GLUCOPHAGE) 500 MG tablet Take 1 tablet (500 mg total) by mouth 2 (two) times daily.   Omega-3 Fatty Acids (FISH OIL) 1000 MG CAPS Take 1 capsule by mouth 2 (two) times daily.   ONETOUCH ULTRA test strip USE TO TEST BLOOD SUGAR ONCE DAILY   fexofenadine (ALLEGRA) 180 MG tablet Take 1 tablet (180 mg total) by mouth daily. (Patient not taking: Reported on 03/15/2023)   No facility-administered encounter medications on file as of 03/15/2023.    Allergies (verified) Lisinopril   History: Past Medical History:  Diagnosis Date   Angioedema 01/2018   Autoimmune urticaria/angioedema-->needs chronic nonsedating antihistamine and an H2 blocker indefinitely.  1 episode ->ASA and ARB d/c'd as precaution.  Then had another episode a couple weeks later.  Another episode just prior to seen allergist--allergist did blood workup/allergy testing->dx of autoimmune urticaria.   Beta thalassemia, heterozygous    vs alpha thalassemia minor (pt's family originally from [place]).   CAP (community acquired pneumonia) 02/2010   Colon cancer screening    Pt declines colonoscopy.  Cologuard negative 01/2018 and 03/2021.   Congenital sensorineural hearing loss    deaf in right ear; mild presbycusis in left ear (audiologist 2014)   Depression    HTN (hypertension)    Hyperkalemia 07/17/2017   Decreased ARB dose, added low dose hctz, low K diet---repeat K normal.   Hyperlipidemia, mixed    Refuses statins   Hypothyroidism    Need for shingles vaccine    PT DECLINES   Osteopenia    DEXAs 2003-2013--pt has  declined bisphosphonates   Somnolence, daytime, controlled 04/22/2012   Type II or unspecified type diabetes mellitus without mention of complication, uncontrolled    Vitamin D deficiency    Past Surgical History:  Procedure Laterality Date   BREAST BIOPSY     Left breast biopsy x 2 (fibrocystic/benign)   COLONOSCOPY  2008   Normal.  10 yr recall (initial colonoscopy done in [place]--needs referral to local GI in 2018.   TONSILLECTOMY AND ADENOIDECTOMY     as a child   Family History  Problem Relation Age of Onset   Hypertension Mother    Hypertension Father    Social History   Socioeconomic History   Marital status: Married    Spouse name: Not on file   Number of children: Not on file   Years of education: Not on file   Highest education level: Not on file  Occupational History   Not on file  Tobacco Use   Smoking status: Never   Smokeless tobacco: Never  Vaping Use   Vaping status: Never Used  Substance and Sexual Activity   Alcohol use: No   Drug use: No   Sexual activity: Not Currently  Other Topics Concern   Not on file  Social History Narrative   Married, 2 daughters, 3 grandchildren.   Retired Radiation protection practitioner.  Relocated from [place] around 2010.   No T/A/Ds.  Exercise: sedentary life + no formal exercise at all.   Diet: regular            Social Drivers of Corporate investment banker Strain: Low Risk  (03/15/2023)   Overall Financial Resource Strain (CARDIA)    Difficulty of Paying Living Expenses: Not hard at all  Food Insecurity: No Food Insecurity (03/15/2023)   Hunger Vital Sign    Worried About Running Out of Food in the Last Year: Never true    Ran Out of Food in the Last Year: Never true  Transportation Needs: No Transportation Needs (03/15/2023)   PRAPARE - Administrator, Civil Service (Medical): No    Lack of Transportation (Non-Medical): No  Physical Activity: Inactive (03/15/2023)   Exercise Vital Sign    Days of  Exercise per Week: 0 days    Minutes of Exercise per Session: 0 min  Stress: No Stress Concern Present (03/15/2023)   [employer] of Occupational Health - Occupational Stress Questionnaire    Feeling of Stress : Not at all  Social Connections: Socially Integrated (03/15/2023)   Social Connection and Isolation Panel [NHANES]    Frequency of Communication with Friends and Family: More than three times a week    Frequency of Social Gatherings with Friends and Family: More than three times a week    Attends Religious Services: More than 4 times per year    Active Member of [employer] or Organizations: Yes    Attends Engineer, structural: More than 4 times per year    Marital Status: Married  Tobacco Counseling Counseling given: Not Answered   Clinical Intake:  Pre-visit preparation completed: Yes  Pain : No/denies pain     Diabetes: Yes CBG done?: No Did pt. bring in CBG monitor from home?: No  How often do you need to have someone help you when you read instructions, pamphlets, or other written materials from your doctor or pharmacy?: 1 - Never  Interpreter Needed?: No  Information entered by :: Remi Haggard LPN   Activities of Daily Living    03/15/2023   11:00 AM  In your present state of health, do you have any difficulty performing the following activities:  Hearing? 1  Vision? 0  Difficulty concentrating or making decisions? 0  Walking or climbing stairs? 0  Dressing or bathing? 0  Doing errands, shopping? 0  Preparing Food and eating ? N  Using the Toilet? N  In the past six months, have you accidently leaked urine? Y  Do you have problems with loss of bowel control? N  Managing your Medications? N  Managing your Finances? N  Housekeeping or managing your Housekeeping? N    Patient Care Team: Jeoffrey Massed, MD as PCP - General (Family Medicine) Christia Reading, MD as Consulting Physician (Otolaryngology) Bobbitt, Heywood Iles, MD as  Consulting Physician (Allergy and Immunology) [facility] as Attending Physician (Optometry)  Indicate any recent Medical Services you may have received from other than [facility] providers in the past year (date may be approximate).     Assessment:   This is a routine wellness examination for Jazzman.  Hearing/Vision screen Hearing Screening - Comments:: Deaf in R ear Bilateral hearing aids Vision Screening - Comments:: Lillia Abed  [facility]   Goals Addressed             This Visit's Progress    Patient Stated   On track    None at this time     Patient Stated       Maintain Current Lifestyle     Quality of Life Maintained   On track    Evidence-based guidance:  Assess patient's thoughts about quality of life, goals and expectations, and dissatisfaction or desire to improve.  Identify issues of primary importance such as mental health, illness, exercise tolerance, pain, sexual function and intimacy, cognitive change, social isolation, finances and relationships.  Assess and monitor for signs/symptoms of psychosocial concerns, especially depression or ideations regarding harm to others or self; provide or refer for mental health services as needed.  Identify sensory issues that impact quality of life such as hearing loss, vision deficit; strategize ways to maintain or improve hearing, vision.  Promote access to services in the community to support independence such as support groups, home visiting programs, financial assistance, handicapped parking tags, durable medical equipment and emergency responder.  Promote activities to decrease social isolation such as group support or social, leisure and recreational activities, employment, use of social media; consider safety concerns about being out of home for activities.  Provide patient an opportunity to share by storytelling or a "life review" to give positive meaning to life and to assist with coping and negative experiences.   Encourage patient to tap into hope to improve sense of self.  Counsel based on prognosis and as early as possible about end-of-life and palliative care; consider referral to palliative care provider.  Advocate for the development of palliative care plan that may include avoidance of unnecessary testing and intervention, symptom control, discontinuation of medications, hospice and organ donation.  Counsel as early as possible those with life-limiting chronic disease about palliative care; consider referral to palliative care provider.  Advocate for the development of palliative care plan.   Notes:        Depression Screen    03/15/2023   11:02 AM 12/06/2022   10:25 AM 09/05/2022    8:40 AM 03/02/2022    4:07 PM 08/27/2021    8:42 AM 02/10/2021   11:49 AM 02/05/2020    9:52 AM  PHQ 2/9 Scores  PHQ - 2 Score 0 0 0 0 1 0 2  PHQ- 9 Score 0      2    Fall Risk    03/15/2023   10:57 AM 03/15/2023   10:56 AM 12/06/2022   10:25 AM 03/02/2022    4:12 PM 02/24/2022    9:42 AM  Fall Risk   Falls in the past year? 0 0 0 0 0  Number falls in past yr: 0 0 0 0 0  Injury with Fall? 0 0 0 0 0  Risk for fall due to :   No Fall Risks  No Fall Risks  Follow up Falls evaluation completed;Education provided;Falls prevention discussed Falls evaluation completed;Education provided;Falls prevention discussed Falls evaluation completed Falls evaluation completed Falls evaluation completed    MEDICARE RISK AT HOME: Medicare Risk at Home Any stairs in or around the home?: No If so, are there any without handrails?: No Home free of loose throw rugs in walkways, pet beds, electrical cords, etc?: Yes Adequate lighting in your home to reduce risk of falls?: Yes Life alert?: No Use of a cane, walker or w/c?: No Grab bars in the bathroom?: Yes Shower chair or bench in shower?: No Elevated toilet seat or a handicapped toilet?: No  TIMED UP AND GO:  Was the test performed?  No    Cognitive Function:         03/15/2023   10:58 AM 03/02/2022    4:13 PM 02/10/2021   11:54 AM  6CIT Screen  What Year? 0 points 4 points 0 points  What month? 0 points 0 points 0 points  What time? 0 points 0 points 0 points  Count back from 20 0 points 0 points 0 points  Months in reverse 0 points 0 points 0 points  Repeat phrase 4 points 0 points 0 points  Total Score 4 points 4 points 0 points    Immunizations Immunization History  Administered Date(s) Administered   Fluad Quad(high Dose 65+) 01/16/2019, 03/17/2020, 02/24/2021, 02/24/2022   Fluad Trivalent(High Dose 65+) 12/06/2022   Influenza Split 03/26/2010, 12/27/2011   Influenza, High Dose Seasonal PF 01/02/2015, 01/08/2016, 01/13/2017   Influenza,inj,Quad PF,6+ Mos 03/04/2013, 03/03/2014   Influenza-Unspecified 03/25/2011, 01/26/2018   PFIZER(Purple Top)SARS-COV-2 Vaccination 05/12/2019, 06/04/2019, 02/26/2020   PPD Test 03/05/2008   Pneumococcal Conjugate-13 09/08/2015   Pneumococcal Polysaccharide-23 07/07/2014   Pneumococcal-Unspecified 03/26/2009   Tdap 03/28/2006   Zoster, Live 03/26/2009     TDAP status: Due, Education has been provided regarding the importance of this vaccine. Advised may receive this vaccine at local pharmacy or Health Dept. Aware to provide a copy of the vaccination record if obtained from local pharmacy or Health Dept. Verbalized acceptance and understanding.  Flu Vaccine status: Up to date  Pneumococcal vaccine status: Up to date  Covid-19 vaccine status: Declined, Education has been provided regarding the importance of this vaccine but patient still declined. Advised may receive this vaccine at local pharmacy or Health Dept.or vaccine clinic. Aware  to provide a copy of the vaccination record if obtained from local pharmacy or Health Dept. Verbalized acceptance and understanding.  Qualifies for Shingles Vaccine? Yes   Zostavax completed No   Shingrix Completed?: No.    Education has been provided regarding  the importance of this vaccine. Patient has been advised to call insurance company to determine out of pocket expense if they have not yet received this vaccine. Advised may also receive vaccine at local pharmacy or Health Dept. Verbalized acceptance and understanding.  Screening Tests Health Maintenance  Topic Date Due   COVID-19 Vaccine (4 - 2024-25 season) 03/31/2023 (Originally 11/27/2022)   Zoster Vaccines- Shingrix (1 of 2) 11/16/2023 (Originally 07[birth year]/1993)   Diabetic kidney evaluation - Urine ACR  06/03/2023   FOOT EXAM  06/03/2023   HEMOGLOBIN A1C  09/05/2023   OPHTHALMOLOGY EXAM  12/05/2023   MAMMOGRAM  03/05/2024   Diabetic kidney evaluation - eGFR measurement  03/06/2024   Medicare Annual Wellness (AWV)  03/14/2024   Pneumonia Vaccine 20+ Years old  Completed   INFLUENZA VACCINE  Completed   DEXA SCAN  Completed   HPV VACCINES  Aged Out   DTaP/Tdap/Td  Discontinued   Hepatitis C Screening  Discontinued   Fecal DNA (Cologuard)  Discontinued    Health Maintenance  There are no preventive care reminders to display for this patient.   Colorectal cancer screening: No longer required.   Mammogram status: Completed  . Repeat every year  Bone Density status: Completed 2013. Results reflect: Bone density results: NORMAL. Repeat every 0 years.  Lung Cancer Screening: (Low Dose CT Chest recommended if Age 74-80 years, 20 pack-year currently smoking OR have quit w/in 15years.) does not qualify.   Lung Cancer Screening Referral:   Additional Screening:  Hepatitis C Screening  never done  Vision Screening: Recommended annual ophthalmology exams for early detection of glaucoma and other disorders of the eye. Is the patient up to date with their annual eye exam?  Yes  Who is the provider or what is the name of the office in which the patient attends annual eye exams? [email or web address] If pt is not established with a provider, would they like to be referred to a provider to establish care?  No .   Dental Screening: Recommended annual dental exams for proper oral hygiene  Nutrition Risk Assessment:  Has the patient had any N/V/D within the last 2 months?  No  Does the patient have any non-healing wounds?  No  Has the patient had any unintentional weight loss or weight gain?  No   Diabetes:  Is the patient diabetic?  Yes  If diabetic, was a CBG obtained today?  No  Did the patient bring in their glucometer from home?  Yes  How often do you monitor your CBG's? 1 x a day.   Financial Strains and Diabetes Management:  Are you having any financial strains with the device, your supplies or your medication? No .  Does the patient want to be seen by Chronic Care Management for management of their diabetes?  No  Would the patient like to be referred to a Nutritionist or for Diabetic Management?  No   Diabetic Exams:  Diabetic Eye Exam: Pt has been advised about the importance in completing this exam. A referral has been placed today  Diabetic Foot Exam:Pt has been advised about the importance in completing this exam.    Community Resource Referral / Chronic Care Management: CRR required this visit?  No  CCM required this visit?  No     Plan:     I have personally reviewed and noted the following in the patient's chart:   Medical and social history Use of alcohol, tobacco or illicit drugs  Current medications and supplements including opioid prescriptions. Patient is not currently taking opioid prescriptions. Functional ability and status Nutritional status Physical activity Advanced directives List of other physicians Hospitalizations, surgeries, and ER visits in previous 12 months Vitals Screenings to include cognitive, depression, and falls Referrals and appointments  In addition, I have reviewed and discussed with patient certain preventive protocols, quality metrics, and best practice recommendations. A written personalized care plan for preventive  services as well as general preventive health recommendations were provided to patient.     Remi Haggard, LPN   [id number]   After Visit Summary: (MyChart) Due to this being a telephonic visit, the after visit summary with patients personalized plan was offered to patient via MyChart   Nurse Notes:

## 2023-04-03 ENCOUNTER — Other Ambulatory Visit: Payer: Self-pay | Admitting: Family Medicine

## 2023-04-03 DIAGNOSIS — E119 Type 2 diabetes mellitus without complications: Secondary | ICD-10-CM

## 2023-04-25 DIAGNOSIS — H5315 Visual distortions of shape and size: Secondary | ICD-10-CM | POA: Diagnosis not present

## 2023-05-05 ENCOUNTER — Other Ambulatory Visit: Payer: Self-pay | Admitting: Family Medicine

## 2023-05-05 MED ORDER — HYDROCHLOROTHIAZIDE 25 MG PO TABS: 25.0000 mg | ORAL_TABLET | Freq: Every day | ORAL | 1 refills | Status: DC

## 2023-05-05 MED ORDER — LEVOTHYROXINE SODIUM 88 MCG PO TABS: ORAL_TABLET | ORAL | 1 refills | Status: DC

## 2023-05-05 MED ORDER — METFORMIN HCL 500 MG PO TABS: 500.0000 mg | ORAL_TABLET | Freq: Two times a day (BID) | ORAL | 1 refills | Status: DC

## 2023-05-05 NOTE — Telephone Encounter (Signed)
 Copied from CRM [id number]. Topic: Clinical - Medication Refill >> May 05, 2023 10:35 AM Burnard DEL wrote: Most Recent Primary Care Visit:  Provider: GREER, JULIE M  Department: [facility]  Visit Type: MEDICARE AWV, SEQUENTIAL  Date: 03/15/2023  Medication: hydrochlorothiazide  (HYDRODIURIL ) 25 MG tablet  amLODipine  (NORVASC ) 10 MG tablet  metFORMIN  (GLUCOPHAGE ) 500 MG tablet   Has the patient contacted their pharmacy? Yes (Agent: If no, request that the patient contact the pharmacy for the refill. If patient does not wish to contact the pharmacy document the reason why and proceed with request.) (Agent: If yes, when and what did the pharmacy advise?)  Is this the correct pharmacy for this prescription? Yes If no, delete pharmacy and type the correct one.  This is the patient's preferred pharmacy:  [facility] - [place], [place] - [address]  Phone: [phone] Fax: [phone]      Has the prescription been filled recently? No  Is the patient out of the medication? No-a few left of the 3  Has the patient been seen for an appointment in the last year OR does the patient have an upcoming appointment? Yes  Can we respond through MyChart? Yes  Agent: Please be advised that Rx refills may take up to 3 business days. We ask that you follow-up with your pharmacy.

## 2023-05-17 ENCOUNTER — Other Ambulatory Visit: Payer: Self-pay

## 2023-05-17 MED ORDER — AMLODIPINE BESYLATE 10 MG PO TABS: 10.0000 mg | ORAL_TABLET | Freq: Every day | ORAL | 1 refills | Status: DC

## 2023-05-23 NOTE — Patient Instructions (Signed)

## 2023-05-24 ENCOUNTER — Encounter: Payer: Self-pay | Admitting: Family Medicine

## 2023-05-24 ENCOUNTER — Ambulatory Visit: Payer: Medicare Other | Admitting: Family Medicine

## 2023-05-24 VITALS — BP 139/68 | HR 60 | Temp 97.7°F | Wt 127.0 lb

## 2023-05-24 DIAGNOSIS — E7800 Pure hypercholesterolemia, unspecified: Secondary | ICD-10-CM | POA: Diagnosis not present

## 2023-05-24 DIAGNOSIS — E119 Type 2 diabetes mellitus without complications: Secondary | ICD-10-CM | POA: Diagnosis not present

## 2023-05-24 DIAGNOSIS — Z7984 Long term (current) use of oral hypoglycemic drugs: Secondary | ICD-10-CM | POA: Diagnosis not present

## 2023-05-24 DIAGNOSIS — I10 Essential (primary) hypertension: Secondary | ICD-10-CM

## 2023-05-24 DIAGNOSIS — E039 Hypothyroidism, unspecified: Secondary | ICD-10-CM | POA: Diagnosis not present

## 2023-05-24 NOTE — Progress Notes (Signed)
 OFFICE VISIT  05/24/2023  CC:  Chief Complaint  Patient presents with   Diabetes    Patient is a 78 y.o. female who presents for 53-month follow-up diabetes and hypertension. A/P as of last visit: "#1 diabetes without complication.  Good control on metformin 500 mg twice daily. Electrolytes and creatinine monitoring today. POC Hba1c 6.3% today.   #2 hypertension, well-controlled on HCTZ 25 mg a day and amlodipine 10 mg a day. Electrolytes and creatinine monitoring today.   #3 hypercholesterolemia, patient has declined medication but wants to keep monitoring her levels periodically. LDL was 134 about 6 months ago. Plan repeat lipids in 6 months.   #4 hypothyroidism, last few TSH levels at lower limit of normal and stable. Most recent was 0.49 about 3 months ago. Continue levothyroxine 88 mcg, 1 tab 5 days a week, 1-1/2 tabs on 2 days a week. Plan repeat TSH approximately September 2025.   #5 mixed urge/stress incontinence. She will try to adjust fluid intake depending on whether she is going out of her house or not.  She will practice some Kegel maneuvers--> printed out today."  INTERIM HX: Mackenzie Wood feels well. She walks regularly for exercise.  Home glucose average 126 fasting.  Stress and urge incontinence unchanged. She is considering starting yoga to help this.  ROS --> no fevers, no CP, no SOB, no wheezing, no cough, no dizziness, no HAs, no rashes, no melena/hematochezia.  No polyuria or polydipsia.  No myalgias or arthralgias.  No focal weakness, paresthesias, or tremors.  No acute vision or hearing abnormalities.  No dysuria or unusual/new urinary urgency or frequency.  No recent changes in lower legs. No n/v/d or abd pain.  No palpitations.     Past Medical History:  Diagnosis Date   Angioedema 01/2018   Autoimmune urticaria/angioedema-->needs chronic nonsedating antihistamine and an H2 blocker indefinitely.  1 episode ->ASA and ARB d/c'd as precaution.  Then had  another episode a couple weeks later.  Another episode just prior to seen allergist--allergist did blood workup/allergy testing->dx of autoimmune urticaria.   Beta thalassemia, heterozygous    vs alpha thalassemia minor (pt's family originally from [place]).   CAP (community acquired pneumonia) 02/2010   Colon cancer screening    Pt declines colonoscopy.  Cologuard negative 01/2018 and 03/2021.   Congenital sensorineural hearing loss    deaf in right ear; mild presbycusis in left ear (audiologist 2014)   Depression    HTN (hypertension)    Hyperkalemia 07/17/2017   Decreased ARB dose, added low dose hctz, low K diet---repeat K normal.   Hyperlipidemia, mixed    Refuses statins   Hypothyroidism    Need for shingles vaccine    PT DECLINES   Osteopenia    DEXAs 2003-2013--pt has declined bisphosphonates   Somnolence, daytime, controlled 04/22/2012   Type II or unspecified type diabetes mellitus without mention of complication, uncontrolled    Vitamin D deficiency     Past Surgical History:  Procedure Laterality Date   BREAST BIOPSY     Left breast biopsy x 2 (fibrocystic/benign)   COLONOSCOPY  2008   Normal.  10 yr recall (initial colonoscopy done in [place]--needs referral to local GI in 2018.   TONSILLECTOMY AND ADENOIDECTOMY     as a child    Outpatient Medications Prior to Visit  Medication Sig Dispense Refill   amLODipine (NORVASC) 10 MG tablet Take 1 tablet (10 mg total) by mouth daily. 90 tablet 1   CALCIUM CARBONATE-VITAMIN D PO Take  by mouth daily. 2000 units     Cyanocobalamin (B-12 PO) Take by mouth.     fexofenadine (ALLEGRA) 180 MG tablet Take 1 tablet (180 mg total) by mouth daily. 90 tablet 1   hydrochlorothiazide (HYDRODIURIL) 25 MG tablet Take 1 tablet (25 mg total) by mouth daily. 90 tablet 1   Lancets (ONETOUCH DELICA PLUS LANCET30G) MISC USE TO TEST BLOOD SUGAR ONCE DAILY DX11.9 100 each 7   levothyroxine (SYNTHROID) 88 MCG tablet TAKE 1 TABLET FIVE DAYS A   WEEK AND 1 AND 1/2 TABLETS TWO DAYS A WEEK 102 tablet 1   metFORMIN (GLUCOPHAGE) 500 MG tablet Take 1 tablet (500 mg total) by mouth 2 (two) times daily. 180 tablet 1   Omega-3 Fatty Acids (FISH OIL) 1000 MG CAPS Take 1 capsule by mouth 2 (two) times daily.     ONETOUCH ULTRA test strip USE TO TEST BLOOD SUGAR ONCE DAILY 100 strip 2   No facility-administered medications prior to visit.    Allergies  Allergen Reactions   Lisinopril Swelling    NOT CLEAR ASSOCIATION    Review of Systems As per HPI  PE:    05/24/2023    9:37 AM 03/07/2023    8:40 AM 12/06/2022   10:14 AM  Vitals with BMI  Height   4\' 11"   Weight 127 lbs 125 lbs 13 oz 124 lbs 13 oz  BMI   25.19  Systolic 139 136 161  Diastolic 68 77 76  Pulse 60 65 58   Physical Exam  Gen: Alert, well appearing.  Patient is oriented to person, place, time, and situation. AFFECT: pleasant, lucid thought and speech. No further exam today  LABS:  Last CBC Lab Results  Component Value Date   WBC 6.2 10/28/2020   HGB 11.7 10/29/2020   HCT 37.5 10/29/2020   MCV 65.1 (L) 10/29/2020   MCH 20.3 (L) 10/29/2020   RDW 17.1 (H) 10/29/2020   PLT 240.0 10/28/2020   Last metabolic panel Lab Results  Component Value Date   GLUCOSE 112 (H) 03/07/2023   NA 141 03/07/2023   K 4.0 03/07/2023   CL 98 03/07/2023   CO2 33 (H) 03/07/2023   BUN 23 03/07/2023   CREATININE 0.76 03/07/2023   GFR 74.41 03/07/2023   CALCIUM 10.1 03/07/2023   PROT 6.9 09/05/2022   ALBUMIN 4.5 09/05/2022   LABGLOB 2.4 02/27/2018   AGRATIO 2.1 02/27/2018   BILITOT 0.4 09/05/2022   ALKPHOS 51 09/05/2022   AST 13 09/05/2022   ALT 12 09/05/2022   Last lipids Lab Results  Component Value Date   CHOL 225 (H) 09/05/2022   HDL 51.70 09/05/2022   LDLCALC 134 (H) 09/05/2022   LDLDIRECT 133.0 02/24/2022   TRIG 197.0 (H) 09/05/2022   CHOLHDL 4 09/05/2022   Last hemoglobin A1c Lab Results  Component Value Date   HGBA1C 6.3 (A) 03/07/2023   HGBA1C 6.3  03/07/2023   HGBA1C 6.3 03/07/2023   HGBA1C 6.3 03/07/2023   Last thyroid functions Lab Results  Component Value Date   TSH 0.49 12/06/2022   Last vitamin D Lab Results  Component Value Date   VD25OH 35.44 09/05/2022   Last vitamin B12 and Folate Lab Results  Component Value Date   VITAMINB12 671 06/03/2022   IMPRESSION AND PLAN:  1 diabetes without complication.  Good control on metformin 500 mg twice daily. Too early for repeat A1c.  Her home glucoses are consistent with past A1c's--> 6.3 to 6.5%. Urine microalbumin/creatinine to  be done at next visit.  #2 hypertension, well-controlled on HCTZ 25 mg a day and amlodipine 10 mg a day.  Electrolytes and creatinine have consistently been normal/stable, most recent December 2024.  #3 hypercholesterolemia, patient has declined medication but wants to keep monitoring her levels periodically. LDL was 134 about 9 months ago. Plan repeat lipids in 3 months.   #4 hypothyroidism, last few TSH levels at lower limit of normal and stable. Most recent was 0.49 about 6 months ago. Continue levothyroxine 88 mcg, 1 tab 5 days a week, 1-1/2 tabs on 2 days a week. Plan repeat TSH approximately September 2025.   #5 mixed urge/stress incontinence. She is trying to work on completely emptying bladder at each voiding. Doing Kegel exercises. She will start yoga soon to see if this helps.  #6 hypothyroidism, last few TSH levels at lower limit of normal and stable. Most recent was 0.49 about 6 months ago. Continue levothyroxine 88 mcg, 1 tab 5 days a week, 1-1/2 tabs on 2 days a week. Plan repeat TSH approximately September 2025.  An After Visit Summary was printed and given to the patient.  FOLLOW UP: No follow-ups on file.  Signed:  Santiago Bumpers, MD           05/24/2023

## 2023-06-06 ENCOUNTER — Ambulatory Visit: Payer: Medicare Other | Admitting: Family Medicine

## 2023-08-30 ENCOUNTER — Ambulatory Visit (INDEPENDENT_AMBULATORY_CARE_PROVIDER_SITE_OTHER): Payer: Medicare Other | Admitting: Family Medicine

## 2023-08-30 ENCOUNTER — Encounter: Payer: Self-pay | Admitting: Family Medicine

## 2023-08-30 VITALS — BP 113/68 | HR 71 | Temp 98.1°F | Ht 59.0 in | Wt 124.4 lb

## 2023-08-30 DIAGNOSIS — Z7984 Long term (current) use of oral hypoglycemic drugs: Secondary | ICD-10-CM

## 2023-08-30 DIAGNOSIS — I10 Essential (primary) hypertension: Secondary | ICD-10-CM

## 2023-08-30 DIAGNOSIS — E7800 Pure hypercholesterolemia, unspecified: Secondary | ICD-10-CM

## 2023-08-30 DIAGNOSIS — E119 Type 2 diabetes mellitus without complications: Secondary | ICD-10-CM | POA: Diagnosis not present

## 2023-08-30 LAB — LIPID PANEL
Cholesterol: 227 mg/dL — ABNORMAL HIGH (ref 0–200)
HDL: 53.5 mg/dL (ref >39.00)
LDL Cholesterol: 146 mg/dL — ABNORMAL HIGH (ref 0–99)
NonHDL: 173.36
Total CHOL/HDL Ratio: 4
Triglycerides: 138 mg/dL (ref 0.0–149.0)
VLDL: 27.6 mg/dL (ref 0.0–40.0)

## 2023-08-30 LAB — COMPREHENSIVE METABOLIC PANEL WITH GFR
ALT: 13 U/L (ref 0–35)
AST: 17 U/L (ref 0–37)
Albumin: 4.7 g/dL (ref 3.5–5.2)
Alkaline Phosphatase: 62 U/L (ref 39–117)
BUN: 21 mg/dL (ref 6–23)
CO2: 34 meq/L — ABNORMAL HIGH (ref 19–32)
Calcium: 9.9 mg/dL (ref 8.4–10.5)
Chloride: 98 meq/L (ref 96–112)
Creatinine, Ser: 0.77 mg/dL (ref 0.40–1.20)
GFR: 73 mL/min (ref >60.00)
Glucose, Bld: 126 mg/dL — ABNORMAL HIGH (ref 70–99)
Potassium: 3.7 meq/L (ref 3.5–5.1)
Sodium: 141 meq/L (ref 135–145)
Total Bilirubin: 0.5 mg/dL (ref 0.2–1.2)
Total Protein: 7 g/dL (ref 6.0–8.3)

## 2023-08-30 LAB — MICROALBUMIN / CREATININE URINE RATIO
Creatinine,U: 157.2 mg/dL
Microalb Creat Ratio: 10.8 mg/g (ref 0.0–30.0)
Microalb, Ur: 1.7 mg/dL (ref 0.0–1.9)

## 2023-08-30 LAB — POCT GLYCOSYLATED HEMOGLOBIN (HGB A1C)
HbA1c POC (<> result, manual entry): 6.2 % (ref 4.0–5.6)
HbA1c, POC (controlled diabetic range): 6.2 % (ref 0.0–7.0)
HbA1c, POC (prediabetic range): 6.2 % (ref 5.7–6.4)
Hemoglobin A1C: 6.2 % — AB (ref 4.0–5.6)

## 2023-08-30 NOTE — Progress Notes (Signed)
 OFFICE VISIT  08/30/2023  CC:  Chief Complaint  Patient presents with   Medical Management of Chronic Issues    Pt is fasting    Patient is a 78 y.o. female who presents for 14-month follow-up diabetes, hypertension, hypercholesterolemia, and hypothyroidism. A/P as of last visit: "1 diabetes without complication.  Good control on metformin  500 mg twice daily. Too early for repeat A1c.  Her home glucoses are consistent with past A1c's--> 6.3 to 6.5%. Urine microalbumin/creatinine to be done at next visit.   #2 hypertension, well-controlled on HCTZ 25 mg a day and amlodipine  10 mg a day.  Electrolytes and creatinine have consistently been normal/stable, most recent December 2024.   #3 hypercholesterolemia, patient has declined medication but wants to keep monitoring her levels periodically. LDL was 134 about 9 months ago. Plan repeat lipids in 3 months.   #4 hypothyroidism, last few TSH levels at lower limit of normal and stable. Most recent was 0.49 about 6 months ago. Continue levothyroxine  88 mcg, 1 tab 5 days a week, 1-1/2 tabs on 2 days a week. Plan repeat TSH approximately September 2025.   #5 mixed urge/stress incontinence. She is trying to work on completely emptying bladder at each voiding. Doing Kegel exercises. She will start yoga soon to see if this helps.   INTERIM HX: Joann feels well. She went to [place]  for several weeks and noted that her glucoses were higher there despite not having any significant change in her diet.  She attributes it to stress. Since coming back into [place]  she notes that her glucoses have been back to her baseline, which is near normal all the time.  She does not monitor blood pressure at home.  ROS as above, plus--> no fevers, no CP, no SOB, no wheezing, no cough, no dizziness, no HAs, no rashes, no melena/hematochezia.  No polyuria or polydipsia.  No myalgias or arthralgias.  No focal weakness, paresthesias, or tremors.  No acute  vision or hearing abnormalities.  No dysuria or unusual/new urinary urgency or frequency.  No recent changes in lower legs. No n/v/d or abd pain.  No palpitations.    Past Medical History:  Diagnosis Date   Angioedema 01/2018   Autoimmune urticaria/angioedema-->needs chronic nonsedating antihistamine and an H2 blocker indefinitely.  1 episode ->ASA and ARB d/c'd as precaution.  Then had another episode a couple weeks later.  Another episode just prior to seen allergist--allergist did blood workup/allergy testing->dx of autoimmune urticaria.   Beta thalassemia, heterozygous    vs alpha thalassemia minor (pt's family originally from [place]).   CAP (community acquired pneumonia) 02/2010   Colon cancer screening    Pt declines colonoscopy.  Cologuard negative 01/2018 and 03/2021.   Congenital sensorineural hearing loss    deaf in right ear; mild presbycusis in left ear (audiologist 2014)   Depression    HTN (hypertension)    Hyperkalemia 07/17/2017   Decreased ARB dose, added low dose hctz, low K diet---repeat K normal.   Hyperlipidemia, mixed    Refuses statins   Hypothyroidism    Need for shingles vaccine    PT DECLINES   Osteopenia    DEXAs 2003-2013--pt has declined bisphosphonates   Somnolence, daytime, controlled 04/22/2012   Type II or unspecified type diabetes mellitus without mention of complication, uncontrolled    Vitamin D  deficiency     Past Surgical History:  Procedure Laterality Date   BREAST BIOPSY     Left breast biopsy x 2 (fibrocystic/benign)  COLONOSCOPY  2008   Normal.  10 yr recall (initial colonoscopy done in [place] --needs referral to local GI in 2018.   TONSILLECTOMY AND ADENOIDECTOMY     as a child    Outpatient Medications Prior to Visit  Medication Sig Dispense Refill   amLODipine  (NORVASC ) 10 MG tablet Take 1 tablet (10 mg total) by mouth daily. 90 tablet 1   CALCIUM CARBONATE-VITAMIN D  PO Take by mouth daily. 2000 units     Cyanocobalamin  (B-12  PO) Take by mouth.     fexofenadine  (ALLEGRA ) 180 MG tablet Take 1 tablet (180 mg total) by mouth daily. 90 tablet 1   hydrochlorothiazide  (HYDRODIURIL ) 25 MG tablet Take 1 tablet (25 mg total) by mouth daily. 90 tablet 1   Lancets (ONETOUCH DELICA PLUS LANCET30G) MISC USE TO TEST BLOOD SUGAR ONCE DAILY DX11.9 100 each 7   levothyroxine  (SYNTHROID ) 88 MCG tablet TAKE 1 TABLET FIVE DAYS A  WEEK AND 1 AND 1/2 TABLETS TWO DAYS A WEEK 102 tablet 1   metFORMIN  (GLUCOPHAGE ) 500 MG tablet Take 1 tablet (500 mg total) by mouth 2 (two) times daily. 180 tablet 1   Omega-3 Fatty Acids (FISH OIL) 1000 MG CAPS Take 1 capsule by mouth 2 (two) times daily.     ONETOUCH ULTRA test strip USE TO TEST BLOOD SUGAR ONCE DAILY 100 strip 2   No facility-administered medications prior to visit.    Allergies  Allergen Reactions   Lisinopril  Swelling    NOT CLEAR ASSOCIATION    Review of Systems As per HPI  PE:    08/30/2023    9:02 AM 05/24/2023    9:37 AM 03/07/2023    8:40 AM  Vitals with BMI  Height 4\' 11"     Weight 124 lbs 6 oz 127 lbs 125 lbs 13 oz  BMI 25.11    Systolic 113 139 829  Diastolic 68 68 77  Pulse 71 60 65     Physical Exam  Gen: Alert, well appearing.  Patient is oriented to person, place, time, and situation. AFFECT: pleasant, lucid thought and speech. Foot exam - no swelling, tenderness or skin or vascular lesions. Color and temperature is normal. Sensation is intact. Peripheral pulses are palpable. Toenails are normal. No further exam today  LABS:  Last CBC Lab Results  Component Value Date   WBC 6.2 10/28/2020   HGB 11.7 10/29/2020   HCT 37.5 10/29/2020   MCV 65.1 (L) 10/29/2020   MCH 20.3 (L) 10/29/2020   RDW 17.1 (H) 10/29/2020   PLT 240.0 10/28/2020   Last metabolic panel Lab Results  Component Value Date   GLUCOSE 112 (H) 03/07/2023   NA 141 03/07/2023   K 4.0 03/07/2023   CL 98 03/07/2023   CO2 33 (H) 03/07/2023   BUN 23 03/07/2023   CREATININE 0.76  03/07/2023   GFR 74.41 03/07/2023   CALCIUM 10.1 03/07/2023   PROT 6.9 09/05/2022   ALBUMIN 4.5 09/05/2022   LABGLOB 2.4 02/27/2018   AGRATIO 2.1 02/27/2018   BILITOT 0.4 09/05/2022   ALKPHOS 51 09/05/2022   AST 13 09/05/2022   ALT 12 09/05/2022   Last lipids Lab Results  Component Value Date   CHOL 225 (H) 09/05/2022   HDL 51.70 09/05/2022   LDLCALC 134 (H) 09/05/2022   LDLDIRECT 133.0 02/24/2022   TRIG 197.0 (H) 09/05/2022   CHOLHDL 4 09/05/2022   Last hemoglobin A1c Lab Results  Component Value Date   HGBA1C 6.2 (A) 08/30/2023   HGBA1C  6.2 08/30/2023   HGBA1C 6.2 08/30/2023   HGBA1C 6.2 08/30/2023   Last thyroid  functions Lab Results  Component Value Date   TSH 0.49 12/06/2022   Last vitamin D  Lab Results  Component Value Date   VD25OH 35.44 09/05/2022   Last vitamin B12 and Folate Lab Results  Component Value Date   VITAMINB12 671 06/03/2022   IMPRESSION AND PLAN:  #1 diabetes without complication, well-controlled on metformin  500 mg twice a day. POC Hba1c today is 6.2%. Feet exam normal today. Urine microalbumin/creatinine today.  2.  Hypertension, well-controlled on amlodipine  10 mg a day and HCTZ 25 mg a day. Electrolytes and creatinine monitoring today.  #3 hypothyroidism, last few TSH levels at lower limit of normal and stable. Most recent was 0.49 about 9 months ago. Continue levothyroxine  88 mcg, 1 tab 5 days a week, 1-1/2 tabs on 2 days a week. Plan repeat TSH approximately September 2025.  An After Visit Summary was printed and given to the patient.  FOLLOW UP: Return in about 3 months (around 11/30/2023) for routine chronic illness f/u.  Signed:  Arletha Lady, MD           08/30/2023

## 2023-08-31 ENCOUNTER — Ambulatory Visit: Payer: Self-pay | Admitting: Family Medicine

## 2023-09-11 ENCOUNTER — Other Ambulatory Visit: Payer: Self-pay | Admitting: Family Medicine

## 2023-09-11 DIAGNOSIS — E119 Type 2 diabetes mellitus without complications: Secondary | ICD-10-CM

## 2023-10-10 DIAGNOSIS — L821 Other seborrheic keratosis: Secondary | ICD-10-CM | POA: Diagnosis not present

## 2023-10-10 DIAGNOSIS — D225 Melanocytic nevi of trunk: Secondary | ICD-10-CM | POA: Diagnosis not present

## 2023-10-10 DIAGNOSIS — L814 Other melanin hyperpigmentation: Secondary | ICD-10-CM | POA: Diagnosis not present

## 2023-10-26 ENCOUNTER — Other Ambulatory Visit: Payer: Self-pay | Admitting: Family Medicine

## 2023-11-30 ENCOUNTER — Ambulatory Visit: Admitting: Family Medicine

## 2023-11-30 ENCOUNTER — Ambulatory Visit: Payer: Self-pay | Admitting: Family Medicine

## 2023-11-30 ENCOUNTER — Encounter: Payer: Self-pay | Admitting: Family Medicine

## 2023-11-30 VITALS — BP 113/70 | HR 76 | Temp 97.3°F | Ht 59.0 in | Wt 126.4 lb

## 2023-11-30 DIAGNOSIS — Z7984 Long term (current) use of oral hypoglycemic drugs: Secondary | ICD-10-CM

## 2023-11-30 DIAGNOSIS — I10 Essential (primary) hypertension: Secondary | ICD-10-CM

## 2023-11-30 DIAGNOSIS — E039 Hypothyroidism, unspecified: Secondary | ICD-10-CM | POA: Diagnosis not present

## 2023-11-30 DIAGNOSIS — E119 Type 2 diabetes mellitus without complications: Secondary | ICD-10-CM

## 2023-11-30 LAB — POCT GLYCOSYLATED HEMOGLOBIN (HGB A1C)
HbA1c POC (<> result, manual entry): 6.3 % (ref 4.0–5.6)
HbA1c, POC (controlled diabetic range): 6.3 % (ref 0.0–7.0)
HbA1c, POC (prediabetic range): 6.3 % (ref 5.7–6.4)
Hemoglobin A1C: 6.3 % — AB (ref 4.0–5.6)

## 2023-11-30 LAB — BASIC METABOLIC PANEL WITH GFR
BUN: 21 mg/dL (ref 6–23)
CO2: 31 meq/L (ref 19–32)
Calcium: 9.3 mg/dL (ref 8.4–10.5)
Chloride: 100 meq/L (ref 96–112)
Creatinine, Ser: 0.77 mg/dL (ref 0.40–1.20)
GFR: 72.87 mL/min (ref >60.00)
Glucose, Bld: 117 mg/dL — ABNORMAL HIGH (ref 70–99)
Potassium: 4.2 meq/L (ref 3.5–5.1)
Sodium: 141 meq/L (ref 135–145)

## 2023-11-30 LAB — TSH: TSH: 0.1 u[IU]/mL — ABNORMAL LOW (ref 0.35–5.50)

## 2023-11-30 MED ORDER — HYDROCHLOROTHIAZIDE 25 MG PO TABS: 25.0000 mg | ORAL_TABLET | Freq: Every day | ORAL | 3 refills | Status: AC

## 2023-11-30 MED ORDER — METFORMIN HCL 500 MG PO TABS: 500.0000 mg | ORAL_TABLET | Freq: Two times a day (BID) | ORAL | 3 refills | Status: AC

## 2023-11-30 MED ORDER — LEVOTHYROXINE SODIUM 88 MCG PO TABS: ORAL_TABLET | ORAL | 3 refills | Status: AC

## 2023-11-30 MED ORDER — ONETOUCH DELICA PLUS LANCET30G MISC: 7 refills | Status: AC

## 2023-11-30 MED ORDER — ONETOUCH ULTRA VI STRP: ORAL_STRIP | 2 refills | Status: AC

## 2023-11-30 NOTE — Progress Notes (Signed)
 OFFICE VISIT  11/30/2023  CC:  Chief Complaint  Patient presents with   Medical Management of Chronic Issues    Pt is not fasting    Patient is a 78 y.o. female who presents for 3-month follow-up diabetes, hypertension, hypothyroidism, and hypercholesterolemia. A/P as of last visit: #1 diabetes without complication, well-controlled on metformin  500 mg twice a day. POC Hba1c today is 6.2%. Feet exam normal today. Urine microalbumin/creatinine today.   2.  Hypertension, well-controlled on amlodipine  10 mg a day and HCTZ 25 mg a day. Electrolytes and creatinine monitoring today.   #3 hypothyroidism, last few TSH levels at lower limit of normal and stable. Most recent was 0.49 about 9 months ago. Continue levothyroxine  88 mcg, 1 tab 5 days a week, 1-1/2 tabs on 2 days a week. Plan repeat TSH approximately September 2025.  INTERIM HX: Mackenzie Wood is feeling well. She is looking forward to taking a trip to [place] and [place] soon with her husband.  Diet and activity level are great. She has no acute concerns.  ROS --> no fevers, no CP, no SOB, no wheezing, no cough, no dizziness, no HAs, no rashes, no melena/hematochezia.  No polyuria or polydipsia.  No myalgias or arthralgias.  No focal weakness, paresthesias, or tremors.  No acute vision or hearing abnormalities.  No dysuria or unusual/new urinary urgency or frequency.  No recent changes in lower legs. No n/v/d or abd pain.  No palpitations.     Past Medical History:  Diagnosis Date   Angioedema 01/2018   Autoimmune urticaria/angioedema-->needs chronic nonsedating antihistamine and an H2 blocker indefinitely.  1 episode ->ASA and ARB d/c'd as precaution.  Then had another episode a couple weeks later.  Another episode just prior to seen allergist--allergist did blood workup/allergy testing->dx of autoimmune urticaria.   Beta thalassemia, heterozygous    vs alpha thalassemia minor (pt's family originally from [place]).   CAP (community  acquired pneumonia) 02/2010   Colon cancer screening    Pt declines colonoscopy.  Cologuard negative 01/2018 and 03/2021.   Congenital sensorineural hearing loss    deaf in right ear; mild presbycusis in left ear (audiologist 2014)   Depression    HTN (hypertension)    Hyperkalemia 07/17/2017   Decreased ARB dose, added low dose hctz, low K diet---repeat K normal.   Hyperlipidemia, mixed    Refuses statins   Hypothyroidism    Need for shingles vaccine    PT DECLINES   Osteopenia    DEXAs 2003-2013--pt has declined bisphosphonates   Somnolence, daytime, controlled 04/22/2012   Type II or unspecified type diabetes mellitus without mention of complication, uncontrolled    Vitamin D  deficiency     Past Surgical History:  Procedure Laterality Date   BREAST BIOPSY     Left breast biopsy x 2 (fibrocystic/benign)   COLONOSCOPY  2008   Normal.  10 yr recall (initial colonoscopy done in [place] --needs referral to local GI in 2018.   TONSILLECTOMY AND ADENOIDECTOMY     as a child    Outpatient Medications Prior to Visit  Medication Sig Dispense Refill   amLODipine  (NORVASC ) 10 MG tablet TAKE 1 TABLET DAILY 90 tablet 0   CALCIUM CARBONATE-VITAMIN D  PO Take by mouth daily. 2000 units     Cyanocobalamin  (B-12 PO) Take by mouth.     fexofenadine  (ALLEGRA ) 180 MG tablet Take 1 tablet (180 mg total) by mouth daily. 90 tablet 1   Omega-3 Fatty Acids (FISH OIL) 1000 MG CAPS Take 1 capsule  by mouth 2 (two) times daily.     hydrochlorothiazide  (HYDRODIURIL ) 25 MG tablet TAKE 1 TABLET DAILY 90 tablet 0   Lancets (ONETOUCH DELICA PLUS LANCET30G) MISC USE TO TEST BLOOD SUGAR ONCE DAILY DX11.9 100 each 7   levothyroxine  (SYNTHROID ) 88 MCG tablet TAKE 1 TABLET FIVE DAYS A  WEEK AND 1 AND 1/2 TABLETS TWO DAYS A WEEK 102 tablet 1   metFORMIN  (GLUCOPHAGE ) 500 MG tablet TAKE 1 TABLET TWICE A DAY 180 tablet 0   ONETOUCH ULTRA test strip USE TO TEST BLOOD SUGAR ONCE DAILY 100 strip 2   No  facility-administered medications prior to visit.    Allergies  Allergen Reactions   Lisinopril  Swelling    NOT CLEAR ASSOCIATION    Review of Systems As per HPI  PE:    11/30/2023    8:40 AM 08/30/2023    9:02 AM 05/24/2023    9:37 AM  Vitals with BMI  Height 4' 11 4' 11   Weight 126 lbs 6 oz 124 lbs 6 oz 127 lbs  BMI 25.52 25.11   Systolic 113 113 860  Diastolic 70 68 68  Pulse 76 71 60     Physical Exam  Gen: Alert, well appearing.  Patient is oriented to person, place, time, and situation. AFFECT: pleasant, lucid thought and speech. No further exam today   LABS:  Last CBC Lab Results  Component Value Date   WBC 6.2 10/28/2020   HGB 11.7 10/29/2020   HCT 37.5 10/29/2020   MCV 65.1 (L) 10/29/2020   MCH 20.3 (L) 10/29/2020   RDW 17.1 (H) 10/29/2020   PLT 240.0 10/28/2020   Last metabolic panel Lab Results  Component Value Date   GLUCOSE 126 (H) 08/30/2023   NA 141 08/30/2023   K 3.7 08/30/2023   CL 98 08/30/2023   CO2 34 (H) 08/30/2023   BUN 21 08/30/2023   CREATININE 0.77 08/30/2023   GFR 73.00 08/30/2023   CALCIUM 9.9 08/30/2023   PROT 7.0 08/30/2023   ALBUMIN 4.7 08/30/2023   LABGLOB 2.4 02/27/2018   AGRATIO 2.1 02/27/2018   BILITOT 0.5 08/30/2023   ALKPHOS 62 08/30/2023   AST 17 08/30/2023   ALT 13 08/30/2023   Last lipids Lab Results  Component Value Date   CHOL 227 (H) 08/30/2023   HDL 53.50 08/30/2023   LDLCALC 146 (H) 08/30/2023   LDLDIRECT 133.0 02/24/2022   TRIG 138.0 08/30/2023   CHOLHDL 4 08/30/2023   Last hemoglobin A1c Lab Results  Component Value Date   HGBA1C 6.3 (A) 11/30/2023   HGBA1C 6.3 11/30/2023   HGBA1C 6.3 11/30/2023   HGBA1C 6.3 11/30/2023   Last thyroid  functions Lab Results  Component Value Date   TSH 0.49 12/06/2022   THYROIDAB 64 (H) 02/27/2018   Last vitamin D  Lab Results  Component Value Date   VD25OH 35.44 09/05/2022   Last vitamin B12 and Folate Lab Results  Component Value Date    VITAMINB12 671 06/03/2022   IMPRESSION AND PLAN:  #1 diabetes without complication, well-controlled on metformin  500 mg twice a day. POC Hba1c today is stable at 6.3%. Serum creatinine and electrolytes today.   2.  Hypertension, well-controlled on amlodipine  10 mg a day and HCTZ 25 mg a day. Electrolytes and creatinine monitoring today.   #3 hypothyroidism, last few TSH levels at lower limit of normal and stable. Most recent was 0.49 about 9 months ago. I continued her on levothyroxine  88 mcg, 1 tab 5 days a week,  1-1/2 tabs on 2 days a week. Recheck TSH today.  An After Visit Summary was printed and given to the patient.  FOLLOW UP: Return in about 3 months (around 02/29/2024) for routine chronic illness f/u.  Signed:  Gerlene Hockey, MD           11/30/2023

## 2023-11-30 NOTE — Patient Instructions (Signed)

## 2023-12-06 DIAGNOSIS — H524 Presbyopia: Secondary | ICD-10-CM | POA: Diagnosis not present

## 2023-12-06 DIAGNOSIS — H2513 Age-related nuclear cataract, bilateral: Secondary | ICD-10-CM | POA: Diagnosis not present

## 2023-12-06 DIAGNOSIS — E119 Type 2 diabetes mellitus without complications: Secondary | ICD-10-CM | POA: Diagnosis not present

## 2023-12-06 DIAGNOSIS — Z135 Encounter for screening for eye and ear disorders: Secondary | ICD-10-CM | POA: Diagnosis not present

## 2023-12-06 LAB — HM DIABETES EYE EXAM

## 2024-01-24 ENCOUNTER — Other Ambulatory Visit: Payer: Self-pay | Admitting: Family Medicine

## 2024-02-28 ENCOUNTER — Ambulatory Visit: Admitting: Family Medicine

## 2024-02-28 ENCOUNTER — Encounter: Payer: Self-pay | Admitting: Family Medicine

## 2024-02-28 VITALS — BP 130/64 | HR 69 | Temp 97.9°F | Resp 14 | Wt 128.0 lb

## 2024-02-28 DIAGNOSIS — Z23 Encounter for immunization: Secondary | ICD-10-CM

## 2024-02-28 DIAGNOSIS — E119 Type 2 diabetes mellitus without complications: Secondary | ICD-10-CM | POA: Diagnosis not present

## 2024-02-28 DIAGNOSIS — Z7984 Long term (current) use of oral hypoglycemic drugs: Secondary | ICD-10-CM | POA: Diagnosis not present

## 2024-02-28 DIAGNOSIS — R5383 Other fatigue: Secondary | ICD-10-CM | POA: Diagnosis not present

## 2024-02-28 DIAGNOSIS — E7800 Pure hypercholesterolemia, unspecified: Secondary | ICD-10-CM | POA: Diagnosis not present

## 2024-02-28 DIAGNOSIS — I10 Essential (primary) hypertension: Secondary | ICD-10-CM

## 2024-02-28 DIAGNOSIS — E039 Hypothyroidism, unspecified: Secondary | ICD-10-CM

## 2024-02-28 LAB — CBC WITH DIFFERENTIAL/PLATELET
Basophils Absolute: 0.1 K/uL (ref 0.0–0.1)
Basophils Relative: 1.1 % (ref 0.0–3.0)
Eosinophils Absolute: 0.4 K/uL (ref 0.0–0.7)
Eosinophils Relative: 5.4 % — ABNORMAL HIGH (ref 0.0–5.0)
HCT: 38.2 % (ref 36.0–46.0)
Hemoglobin: 12.2 g/dL (ref 12.0–15.0)
Lymphocytes Relative: 28.4 % (ref 12.0–46.0)
Lymphs Abs: 2 K/uL (ref 0.7–4.0)
MCHC: 31.9 g/dL (ref 30.0–36.0)
MCV: 64.3 fl — ABNORMAL LOW (ref 78.0–100.0)
Monocytes Absolute: 0.5 K/uL (ref 0.1–1.0)
Monocytes Relative: 7.8 % (ref 3.0–12.0)
Neutro Abs: 4 K/uL (ref 1.4–7.7)
Neutrophils Relative %: 57.3 % (ref 43.0–77.0)
Platelets: 389 K/uL (ref 150.0–400.0)
RBC: 5.95 Mil/uL — ABNORMAL HIGH (ref 3.87–5.11)
RDW: 16.5 % — ABNORMAL HIGH (ref 11.5–15.5)
WBC: 7 K/uL (ref 4.0–10.5)

## 2024-02-28 LAB — BASIC METABOLIC PANEL WITH GFR
BUN: 23 mg/dL (ref 6–23)
CO2: 31 meq/L (ref 19–32)
Calcium: 10.2 mg/dL (ref 8.4–10.5)
Chloride: 99 meq/L (ref 96–112)
Creatinine, Ser: 0.72 mg/dL (ref 0.40–1.20)
GFR: 78.85 mL/min (ref >60.00)
Glucose, Bld: 113 mg/dL — ABNORMAL HIGH (ref 70–99)
Potassium: 4 meq/L (ref 3.5–5.1)
Sodium: 141 meq/L (ref 135–145)

## 2024-02-28 LAB — POCT GLYCOSYLATED HEMOGLOBIN (HGB A1C)
HbA1c POC (<> result, manual entry): 6.7 % (ref 4.0–5.6)
HbA1c, POC (controlled diabetic range): 6.7 % (ref 0.0–7.0)
HbA1c, POC (prediabetic range): 6.7 % — AB (ref 5.7–6.4)
Hemoglobin A1C: 6.7 % — AB (ref 4.0–5.6)

## 2024-02-28 LAB — LIPID PANEL
Cholesterol: 221 mg/dL — ABNORMAL HIGH (ref 0–200)
HDL: 59 mg/dL (ref >39.00)
LDL Cholesterol: 129 mg/dL — ABNORMAL HIGH (ref 0–99)
NonHDL: 162.34
Total CHOL/HDL Ratio: 4
Triglycerides: 165 mg/dL — ABNORMAL HIGH (ref 0.0–149.0)
VLDL: 33 mg/dL (ref 0.0–40.0)

## 2024-02-28 LAB — TSH: TSH: 0.19 u[IU]/mL — ABNORMAL LOW (ref 0.35–5.50)

## 2024-02-28 MED ORDER — AMLODIPINE BESYLATE 10 MG PO TABS: 10.0000 mg | ORAL_TABLET | Freq: Every day | ORAL | 3 refills | Status: AC

## 2024-02-28 NOTE — Progress Notes (Signed)
 OFFICE VISIT  02/28/2024  CC:  Chief Complaint  Patient presents with   Medical Management of Chronic Issues    Patient is a 78 y.o. female who presents for 106-month follow-up diabetes, hypertension, hypothyroidism, and hypercholesterolemia. A/P as of last visit: 1 diabetes without complication, well-controlled on metformin  500 mg twice a day. POC Hba1c today is stable at 6.3%. Serum creatinine and electrolytes today.   2.  Hypertension, well-controlled on amlodipine  10 mg a day and HCTZ 25 mg a day. Electrolytes and creatinine monitoring today.   #3 hypothyroidism, last few TSH levels at lower limit of normal and stable. Most recent was 0.49 about 9 months ago. I continued her on levothyroxine  88 mcg, 1 tab 5 days a week, 1-1/2 tabs on 2 days a week. Recheck TSH today.  INTERIM HX: Overall Danali feels like she is doing pretty well. She does note chronic cold intolerance.  She says it actually does not matter what environment she is in she feels cold. Certainly worse with recent cold weather. She does feel more tired lately than normal.  She went on a brief vacation and noted her sugars began going up at that time and they have not returned to normal yet (approximately 2 to 3 weeks). Numbers usually in the 110-140 range for her.  Lately have been more in the 140-1 160s range. She has not been able to walk as much lately for her exercise--> due to her schedule.   ROS as above, plus--> no fevers, no CP, no SOB, no wheezing, no cough, no dizziness, no HAs, no rashes, no melena/hematochezia.  No polyuria or polydipsia.  No myalgias or arthralgias.  No focal weakness, paresthesias, or tremors.  No acute vision or hearing abnormalities.  No dysuria or unusual/new urinary urgency or frequency.  No recent changes in lower legs. No n/v/d or abd pain.  No palpitations.    Past Medical History:  Diagnosis Date   Angioedema 01/2018   Autoimmune urticaria/angioedema-->needs chronic  nonsedating antihistamine and an H2 blocker indefinitely.  1 episode ->ASA and ARB d/c'd as precaution.  Then had another episode a couple weeks later.  Another episode just prior to seen allergist--allergist did blood workup/allergy testing->dx of autoimmune urticaria.   Beta thalassemia, heterozygous    vs alpha thalassemia minor (pt's family originally from [place]).   CAP (community acquired pneumonia) 02/2010   Colon cancer screening    Pt declines colonoscopy.  Cologuard negative 01/2018 and 03/2021.   Congenital sensorineural hearing loss    deaf in right ear; mild presbycusis in left ear (audiologist 2014)   Depression    HTN (hypertension)    Hyperkalemia 07/17/2017   Decreased ARB dose, added low dose hctz, low K diet---repeat K normal.   Hyperlipidemia, mixed    Refuses statins   Hypothyroidism    Need for shingles vaccine    PT DECLINES   Osteopenia    DEXAs 2003-2013--pt has declined bisphosphonates   Somnolence, daytime, controlled 04/22/2012   Type II or unspecified type diabetes mellitus without mention of complication, uncontrolled    Vitamin D  deficiency     Past Surgical History:  Procedure Laterality Date   BREAST BIOPSY     Left breast biopsy x 2 (fibrocystic/benign)   COLONOSCOPY  2008   Normal.  10 yr recall (initial colonoscopy done in [place] --needs referral to local GI in 2018.   TONSILLECTOMY AND ADENOIDECTOMY     as a child    Outpatient Medications Prior to Visit  Medication  Sig Dispense Refill   amLODipine  (NORVASC ) 10 MG tablet TAKE 1 TABLET DAILY 90 tablet 0   CALCIUM CARBONATE-VITAMIN D  PO Take by mouth daily. 2000 units     Cyanocobalamin  (B-12 PO) Take by mouth.     fexofenadine  (ALLEGRA ) 180 MG tablet Take 1 tablet (180 mg total) by mouth daily. 90 tablet 1   glucose blood (ONETOUCH ULTRA) test strip USE TO TEST BLOOD SUGAR ONCE DAILY 100 strip 2   hydrochlorothiazide  (HYDRODIURIL ) 25 MG tablet Take 1 tablet (25 mg total) by mouth daily.  90 tablet 3   Lancets (ONETOUCH DELICA PLUS LANCET30G) MISC USE TO TEST BLOOD SUGAR ONCE DAILY DX11.9 100 each 7   levothyroxine  (SYNTHROID ) 88 MCG tablet TAKE 1 TABLET FIVE DAYS A  WEEK AND 1 AND 1/2 TABLETS TWO DAYS A WEEK 102 tablet 3   metFORMIN  (GLUCOPHAGE ) 500 MG tablet Take 1 tablet (500 mg total) by mouth 2 (two) times daily. 180 tablet 3   Omega-3 Fatty Acids (FISH OIL) 1000 MG CAPS Take 1 capsule by mouth 2 (two) times daily.     No facility-administered medications prior to visit.    Allergies  Allergen Reactions   Lisinopril  Swelling    NOT CLEAR ASSOCIATION    Review of Systems As per HPI  PE:    02/28/2024    9:45 AM 11/30/2023    8:40 AM 08/30/2023    9:02 AM  Vitals with BMI  Height  4' 11 4' 11  Weight 128 lbs 126 lbs 6 oz 124 lbs 6 oz  BMI  25.52 25.11  Systolic 149 113 886  Diastolic 77 70 68  Pulse 69 76 71     Physical Exam  Gen: Alert, well appearing.  Patient is oriented to person, place, time, and situation. AFFECT: pleasant, lucid thought and speech. No pallor. No further exam today.  LABS:  Last CBC Lab Results  Component Value Date   WBC 6.2 10/28/2020   HGB 11.7 10/29/2020   HCT 37.5 10/29/2020   MCV 65.1 (L) 10/29/2020   MCH 20.3 (L) 10/29/2020   RDW 17.1 (H) 10/29/2020   PLT 240.0 10/28/2020   Lab Results  Component Value Date   IRON 137 05/24/2012   TIBC 375 05/24/2012   Last metabolic panel Lab Results  Component Value Date   GLUCOSE 117 (H) 11/30/2023   NA 141 11/30/2023   K 4.2 11/30/2023   CL 100 11/30/2023   CO2 31 11/30/2023   BUN 21 11/30/2023   CREATININE 0.77 11/30/2023   GFR 72.87 11/30/2023   CALCIUM 9.3 11/30/2023   PROT 7.0 08/30/2023   ALBUMIN 4.7 08/30/2023   LABGLOB 2.4 02/27/2018   AGRATIO 2.1 02/27/2018   BILITOT 0.5 08/30/2023   ALKPHOS 62 08/30/2023   AST 17 08/30/2023   ALT 13 08/30/2023   Last lipids Lab Results  Component Value Date   CHOL 227 (H) 08/30/2023   HDL 53.50 08/30/2023    LDLCALC 146 (H) 08/30/2023   LDLDIRECT 133.0 02/24/2022   TRIG 138.0 08/30/2023   CHOLHDL 4 08/30/2023   Last hemoglobin A1c Lab Results  Component Value Date   HGBA1C 6.7 (A) 02/28/2024   HGBA1C 6.7 02/28/2024   HGBA1C 6.7 (A) 02/28/2024   HGBA1C 6.7 02/28/2024   Last thyroid  functions Lab Results  Component Value Date   TSH 0.10 (L) 11/30/2023   THYROIDAB 64 (H) 02/27/2018   Last vitamin D  Lab Results  Component Value Date   VD25OH 35.44 09/05/2022  Last vitamin B12 and Folate Lab Results  Component Value Date   VITAMINB12 671 06/03/2022   IMPRESSION AND PLAN:  No problem-specific Assessment & Plan notes found for this encounter.  1 diabetes without complication, control not as ideal lately.   POC Hba1c today is up from 6.3% to 6.7%. Most likely dietary and activity related.   Will continue on metformin  500 mg twice a day. Serum creatinine and electrolytes today.   2.  Hypertension, well-controlled on amlodipine  10 mg a day and HCTZ 25 mg a day. Electrolytes and creatinine monitoring today.   #3 hypothyroidism, last few TSH levels at lower limit of normal and stable. Most recent was 0.1 (slightly low) about 3 months ago. I continued her on levothyroxine  88 mcg, 1 tab 5 days a week, 1-1/2 tabs on 2 days a week. Recheck TSH today.  #4 fatigue. Monitor thyroid , metabolic panel, and CBC today.  An After Visit Summary was printed and given to the patient.  FOLLOW UP: No follow-ups on file.  Signed:  Gerlene Hockey, MD           02/28/2024

## 2024-02-28 NOTE — Patient Instructions (Signed)
   It was very nice to see you today! Don't forget to schedule your mammogram.  PLEASE NOTE:  If labs were collected or images ordered, we will inform you of  results once we have received them and reviewed. We will contact you either by echart message, or telephone call.  Please give ample time to the testing facility, and our office to run,  receive and review results. Please do not call inquiring of results, even if you can see them in your chart. We will contact you as soon as we are able. If it has been over 1 week since the test was completed, and you have not yet heard from us , then please call us .     - echart message- for normal results that have been seen by the patient already.   - telephone call: abnormal results or if patient has not viewed results in their echart.   If a referral to a specialist was entered for you, please call us  in 2 weeks if you have not heard from the specialist office to schedule.

## 2024-02-29 ENCOUNTER — Ambulatory Visit: Payer: Self-pay | Admitting: Family Medicine

## 2024-02-29 NOTE — Telephone Encounter (Signed)
 Pt advised of results.

## 2024-03-12 ENCOUNTER — Other Ambulatory Visit: Payer: Self-pay | Admitting: Family Medicine

## 2024-03-12 DIAGNOSIS — Z1231 Encounter for screening mammogram for malignant neoplasm of breast: Secondary | ICD-10-CM

## 2024-04-08 ENCOUNTER — Ambulatory Visit
Admission: RE | Admit: 2024-04-08 | Discharge: 2024-04-08 | Disposition: A | Source: Ambulatory Visit | Attending: Family Medicine | Admitting: Family Medicine

## 2024-04-08 ENCOUNTER — Telehealth: Payer: Self-pay

## 2024-04-08 DIAGNOSIS — Z1231 Encounter for screening mammogram for malignant neoplasm of breast: Secondary | ICD-10-CM

## 2024-04-08 NOTE — Telephone Encounter (Signed)
 Communication  Reason for CRM: Patient would like a call back. She stated that she needs an opinion on what she should do about the hardening of her stomach  CB# [phone]   Sent MyChart.

## 2024-04-12 ENCOUNTER — Ambulatory Visit: Admitting: Sports Medicine

## 2024-04-12 ENCOUNTER — Encounter: Payer: Self-pay | Admitting: Sports Medicine

## 2024-04-12 VITALS — BP 146/80 | HR 71 | Temp 97.7°F | Wt 131.0 lb

## 2024-04-12 DIAGNOSIS — K5900 Constipation, unspecified: Secondary | ICD-10-CM | POA: Diagnosis not present

## 2024-04-12 DIAGNOSIS — E039 Hypothyroidism, unspecified: Secondary | ICD-10-CM | POA: Diagnosis not present

## 2024-04-12 DIAGNOSIS — I10 Essential (primary) hypertension: Secondary | ICD-10-CM | POA: Diagnosis not present

## 2024-04-12 DIAGNOSIS — E119 Type 2 diabetes mellitus without complications: Secondary | ICD-10-CM

## 2024-04-12 DIAGNOSIS — Z7984 Long term (current) use of oral hypoglycemic drugs: Secondary | ICD-10-CM | POA: Diagnosis not present

## 2024-04-12 MED ORDER — DOCUSATE SODIUM 100 MG PO CAPS: 100.0000 mg | ORAL_CAPSULE | Freq: Two times a day (BID) | ORAL | 0 refills | Status: DC

## 2024-04-12 NOTE — Assessment & Plan Note (Addendum)
 Lab Results  Component Value Date   HGBA1C 6.7 (A) 02/28/2024   HGBA1C 6.7 02/28/2024   HGBA1C 6.7 (A) 02/28/2024   HGBA1C 6.7 02/28/2024   Cont with metformin   Avoid high carbohydrate foods

## 2024-04-12 NOTE — Assessment & Plan Note (Addendum)
 Lab Results  Component Value Date   TSH 0.19 (L) 02/28/2024   Will check TSH and adjust the dose accordingly Orders:   TSH

## 2024-04-12 NOTE — Progress Notes (Signed)
 "  Careteam: Patient Care Team: Mackenzie Aleene DEL, MD as PCP - General (Family Medicine) Mackenzie Clark, MD as Consulting Physician (Otolaryngology) Bobbitt, Elgin Pepper, MD as Consulting Physician (Allergy and Immunology) [facility] as Attending Physician (Optometry)  Allergies[1]  Chief Complaint  Patient presents with   Abdominal Pain    Pt has been having issues with constipation for a few months now.     Discussed the use of AI scribe software for clinical note transcription with the patient, who gave verbal consent to proceed.  History of Present Illness   Mackenzie Wood is an 79 year old female who presents with constipation and elevated blood pressure.  She has been experiencing constipation since December, characterized by small, hard stools described as 'pellets'. Despite a diet rich in fruits, vegetables, and whole grains, her bowel movements remain small and infrequent. She has increased her water intake but is unsure if it meets the recommended 40-50 ounces per day. No abdominal pain, blood in the stool, or straining during bowel movements. Denies dark color stools.    Her blood pressure has been noted to be higher than usual, and her morning blood sugars have increased to 130-140 mg/dL, whereas they were previously in the 120s. Her last A1c was 6.7%.  She takes levothyroxine , with a regimen of one and a half tablets on Wednesdays and Saturdays, and one tablet on other days. Her thyroid  levels were previously noted to be high.  She reports experiencing six episodes of dizziness since the summer, each lasting 45 seconds to a minute, occurring while standing or moving, but not associated with any specific activity. No chest pain, shortness of breath, headaches, or dizziness when sitting.  Review of Systems:  Review of Systems  Constitutional:  Negative for chills and fever.  HENT:  Negative for congestion and sore throat.   Eyes:  Negative for blurred vision.   Respiratory:  Negative for cough, sputum production and shortness of breath.   Cardiovascular:  Negative for chest pain, palpitations and leg swelling.  Gastrointestinal:  Positive for constipation. Negative for abdominal pain, blood in stool, heartburn and nausea.  Genitourinary:  Negative for dysuria, frequency and hematuria.  Musculoskeletal:  Negative for falls and myalgias.  Neurological:  Negative for dizziness, sensory change and focal weakness.   Negative unless indicated in HPI.   Patient Active Problem List   Diagnosis Date Noted   Angioedema 02/27/2018   History of food allergy 02/27/2018   Asymmetric SNHL (sensorineural hearing loss) 05/22/2017   Autoimmune urticaria 02/02/2014   Local reaction to insect sting 09/16/2013   Eczematous dermatitis 02/07/2013   Hypothyroidism 08/31/2012   Diabetes mellitus without complication (HCC) 05/31/2012   Benign essential hypertension 08/04/2010   Major depressive disorder, recurrent episode 06/09/2010   Osteopenia 04/26/2010   Hyperlipidemia with target LDL less than 130 04/26/2010   Alopecia 03/15/2010   Disease of tricuspid valve 11/11/2008   Mitral valve regurgitation 11/11/2008   Past Medical History:  Diagnosis Date   Angioedema 01/2018   Autoimmune urticaria/angioedema-->needs chronic nonsedating antihistamine and an H2 blocker indefinitely.  1 episode ->ASA and ARB d/c'd as precaution.  Then had another episode a couple weeks later.  Another episode just prior to seen allergist--allergist did blood workup/allergy testing->dx of autoimmune urticaria.   Beta thalassemia, heterozygous    vs alpha thalassemia minor (pt's family originally from [place]).   CAP (community acquired pneumonia) 02/2010   Colon cancer screening    Pt declines colonoscopy.  Cologuard negative 01/2018  and 03/2021.   Congenital sensorineural hearing loss    deaf in right ear; mild presbycusis in left ear (audiologist 2014)   Depression    HTN  (hypertension)    Hyperkalemia 07/17/2017   Decreased ARB dose, added low dose hctz, low K diet---repeat K normal.   Hyperlipidemia, mixed    Refuses statins   Hypothyroidism    Need for shingles vaccine    PT DECLINES   Osteopenia    DEXAs 2003-2013--pt has declined bisphosphonates   Somnolence, daytime, controlled 04/22/2012   Type II or unspecified type diabetes mellitus without mention of complication, uncontrolled    Vitamin D  deficiency    Past Surgical History:  Procedure Laterality Date   BREAST BIOPSY     Left breast biopsy x 2 (fibrocystic/benign)   COLONOSCOPY  2008   Normal.  10 yr recall (initial colonoscopy done in [place] --needs referral to local GI in 2018.   TONSILLECTOMY AND ADENOIDECTOMY     as a child   Social History[2] Family History  Problem Relation Age of Onset   Hypertension Mother    Hypertension Father    Allergies[3]  Medications: Patient's Medications  New Prescriptions   DOCUSATE SODIUM  (COLACE) 100 MG CAPSULE    Take 1 capsule (100 mg total) by mouth 2 (two) times daily.  Previous Medications   AMLODIPINE  (NORVASC ) 10 MG TABLET    Take 1 tablet (10 mg total) by mouth daily.   CALCIUM CARBONATE-VITAMIN D  PO    Take by mouth daily. 2000 units   CYANOCOBALAMIN  (B-12 PO)    Take by mouth.   FEXOFENADINE  (ALLEGRA ) 180 MG TABLET    Take 1 tablet (180 mg total) by mouth daily.   GLUCOSE BLOOD (ONETOUCH ULTRA) TEST STRIP    USE TO TEST BLOOD SUGAR ONCE DAILY   HYDROCHLOROTHIAZIDE  (HYDRODIURIL ) 25 MG TABLET    Take 1 tablet (25 mg total) by mouth daily.   LANCETS (ONETOUCH DELICA PLUS LANCET30G) MISC    USE TO TEST BLOOD SUGAR ONCE DAILY DX11.9   LEVOTHYROXINE  (SYNTHROID ) 88 MCG TABLET    TAKE 1 TABLET FIVE DAYS A  WEEK AND 1 AND 1/2 TABLETS TWO DAYS A WEEK   METFORMIN  (GLUCOPHAGE ) 500 MG TABLET    Take 1 tablet (500 mg total) by mouth 2 (two) times daily.   OMEGA-3 FATTY ACIDS (FISH OIL) 1000 MG CAPS    Take 1 capsule by mouth 2 (two) times  daily.  Modified Medications   No medications on file  Discontinued Medications   No medications on file    Physical Exam: Vitals:   04/12/24 1359 04/12/24 1403 04/12/24 1424  BP: (!) 151/81 (!) 146/80   Pulse: 71    Temp: 97.7 F (36.5 C)    TempSrc: Oral    SpO2: 97%  96%  Weight: 131 lb (59.4 kg)     Body mass index is 26.46 kg/m. BP Readings from Last 3 Encounters:  04/12/24 (!) 146/80  02/28/24 130/64  11/30/23 113/70   Wt Readings from Last 3 Encounters:  04/12/24 131 lb (59.4 kg)  02/28/24 128 lb (58.1 kg)  11/30/23 126 lb 6.4 oz (57.3 kg)    Physical Exam Constitutional:      Appearance: Normal appearance.  HENT:     Head: Normocephalic and atraumatic.  Cardiovascular:     Rate and Rhythm: Normal rate and regular rhythm.  Pulmonary:     Effort: Pulmonary effort is normal. No respiratory distress.     Breath sounds:  Normal breath sounds. No wheezing.  Abdominal:     General: Bowel sounds are normal. There is no distension.     Tenderness: There is no abdominal tenderness. There is no guarding or rebound.     Comments:    Musculoskeletal:        General: No swelling or tenderness.  Skin:    General: Skin is dry.  Neurological:     Mental Status: She is alert. Mental status is at baseline.     Sensory: No sensory deficit.     Motor: No weakness.     Labs reviewed: Basic Metabolic Panel: Recent Labs    08/30/23 0925 11/30/23 0910 02/28/24 1007  NA 141 141 141  K 3.7 4.2 4.0  CL 98 100 99  CO2 34* 31 31  GLUCOSE 126* 117* 113*  BUN 21 21 23   CREATININE 0.77 0.77 0.72  CALCIUM 9.9 9.3 10.2  TSH  --  0.10* 0.19*   Liver Function Tests: Recent Labs    08/30/23 0925  AST 17  ALT 13  ALKPHOS 62  BILITOT 0.5  PROT 7.0  ALBUMIN 4.7   No results for input(s): LIPASE, AMYLASE in the last 8760 hours. No results for input(s): AMMONIA in the last 8760 hours. CBC: Recent Labs    02/28/24 1007  WBC 7.0  NEUTROABS 4.0  HGB 12.2   HCT 38.2  MCV 64.3*  PLT 389.0   Lipid Panel: Recent Labs    08/30/23 0925 02/28/24 1007  CHOL 227* 221*  HDL 53.50 59.00  LDLCALC 146* 129*  TRIG 138.0 165.0*  CHOLHDL 4 4   TSH: Recent Labs    11/30/23 0910 02/28/24 1007  TSH 0.10* 0.19*   A1C: Lab Results  Component Value Date   HGBA1C 6.7 (A) 02/28/2024   HGBA1C 6.7 02/28/2024   HGBA1C 6.7 (A) 02/28/2024   HGBA1C 6.7 02/28/2024    Assessment & Plan Acquired hypothyroidism Lab Results  Component Value Date   TSH 0.19 (L) 02/28/2024   Will check TSH and adjust the dose accordingly Orders:   TSH  Constipation, unspecified constipation type Passing hard stools Instructed patient to increase oral hydration  Start taking colace twice daily Orders:   docusate sodium  (COLACE) 100 MG capsule; Take 1 capsule (100 mg total) by mouth 2 (two) times daily.  Primary hypertension Bp slightly high  Pt reports that home readings are always below 130's  Orthostasis negative Cont with HCTZ    Diabetes mellitus without complication (HCC) Lab Results  Component Value Date   HGBA1C 6.7 (A) 02/28/2024   HGBA1C 6.7 02/28/2024   HGBA1C 6.7 (A) 02/28/2024   HGBA1C 6.7 02/28/2024   Cont with metformin   Avoid high carbohydrate foods     No follow-ups on file.:   Ezriel Boffa     [1]  Allergies Allergen Reactions   Lisinopril  Swelling    NOT CLEAR ASSOCIATION  [2]  Social History Tobacco Use   Smoking status: Never   Smokeless tobacco: Never  Vaping Use   Vaping status: Never Used  Substance Use Topics   Alcohol use: No   Drug use: No  [3]  Allergies Allergen Reactions   Lisinopril  Swelling    NOT CLEAR ASSOCIATION   "

## 2024-04-13 LAB — TSH: TSH: 0.18 m[IU]/L — ABNORMAL LOW (ref 0.40–4.50)

## 2024-04-15 ENCOUNTER — Ambulatory Visit: Payer: Self-pay | Admitting: Sports Medicine

## 2024-04-15 DIAGNOSIS — E039 Hypothyroidism, unspecified: Secondary | ICD-10-CM

## 2024-04-23 ENCOUNTER — Other Ambulatory Visit: Payer: Self-pay | Admitting: Sports Medicine

## 2024-04-23 DIAGNOSIS — K5900 Constipation, unspecified: Secondary | ICD-10-CM

## 2024-05-28 ENCOUNTER — Ambulatory Visit: Admitting: Family Medicine
